# Patient Record
Sex: Male | Born: 1945 | Race: White | Hispanic: No | Marital: Married | Smoking: Never smoker
Health system: Southern US, Community
[De-identification: ages and names within clinical notes are randomized; demographics above are authoritative.]

## PROBLEM LIST (undated history)

## (undated) DIAGNOSIS — J45909 Unspecified asthma, uncomplicated: Secondary | ICD-10-CM

## (undated) DIAGNOSIS — Z9889 Other specified postprocedural states: Secondary | ICD-10-CM

## (undated) DIAGNOSIS — G4730 Sleep apnea, unspecified: Secondary | ICD-10-CM

## (undated) DIAGNOSIS — I340 Nonrheumatic mitral (valve) insufficiency: Secondary | ICD-10-CM

## (undated) DIAGNOSIS — I898 Other specified noninfective disorders of lymphatic vessels and lymph nodes: Secondary | ICD-10-CM

## (undated) DIAGNOSIS — S0990XA Unspecified injury of head, initial encounter: Secondary | ICD-10-CM

## (undated) DIAGNOSIS — R011 Cardiac murmur, unspecified: Secondary | ICD-10-CM

## (undated) DIAGNOSIS — K649 Unspecified hemorrhoids: Secondary | ICD-10-CM

## (undated) DIAGNOSIS — IMO0001 Reserved for inherently not codable concepts without codable children: Secondary | ICD-10-CM

## (undated) HISTORY — DX: Nonrheumatic mitral (valve) insufficiency: I34.0

## (undated) HISTORY — PX: COLONOSCOPY: SHX174

---

## 1968-09-25 HISTORY — PX: OTHER SURGICAL HISTORY: SHX169

## 1997-05-21 ENCOUNTER — Inpatient Hospital Stay (HOSPITAL_COMMUNITY): Admission: EM | Admit: 1997-05-21 | Discharge: 1997-05-25 | Payer: Self-pay | Admitting: Emergency Medicine

## 1999-07-03 ENCOUNTER — Ambulatory Visit (HOSPITAL_COMMUNITY): Admission: RE | Admit: 1999-07-03 | Discharge: 1999-07-03 | Payer: Self-pay | Admitting: Gastroenterology

## 1999-07-08 ENCOUNTER — Encounter (HOSPITAL_COMMUNITY): Admission: RE | Admit: 1999-07-08 | Discharge: 1999-10-06 | Payer: Self-pay | Admitting: Gastroenterology

## 2014-05-08 DIAGNOSIS — G479 Sleep disorder, unspecified: Secondary | ICD-10-CM

## 2014-05-08 DIAGNOSIS — R5383 Other fatigue: Secondary | ICD-10-CM | POA: Insufficient documentation

## 2014-05-08 DIAGNOSIS — E559 Vitamin D deficiency, unspecified: Secondary | ICD-10-CM

## 2014-05-08 DIAGNOSIS — R011 Cardiac murmur, unspecified: Secondary | ICD-10-CM | POA: Insufficient documentation

## 2014-05-09 ENCOUNTER — Ambulatory Visit: Payer: Self-pay | Admitting: Cardiovascular Disease

## 2014-05-10 ENCOUNTER — Ambulatory Visit (INDEPENDENT_AMBULATORY_CARE_PROVIDER_SITE_OTHER): Payer: Medicare Other | Admitting: Cardiology

## 2014-05-10 ENCOUNTER — Encounter: Payer: Self-pay | Admitting: Cardiology

## 2014-05-10 ENCOUNTER — Encounter: Payer: Self-pay | Admitting: *Deleted

## 2014-05-10 VITALS — BP 146/80 | HR 62 | Ht 67.0 in | Wt 186.4 lb

## 2014-05-10 DIAGNOSIS — R0989 Other specified symptoms and signs involving the circulatory and respiratory systems: Secondary | ICD-10-CM | POA: Diagnosis not present

## 2014-05-10 DIAGNOSIS — R011 Cardiac murmur, unspecified: Secondary | ICD-10-CM | POA: Diagnosis not present

## 2014-05-10 NOTE — Progress Notes (Addendum)
     Clinical Summary Jonathan Church is a 69 y.o.male seen today as a new patient for the  following medical problems.   1. Heart murmur - noted by pcp last week, patient denies any prior history - notes some DOE with high levels of activity, example running upstairs - no LE edema. No chest pain, no palpitations    Denies PMH   No Known Allergies   Current Outpatient Prescriptions  Medication Sig Dispense Refill  . OVER THE COUNTER MEDICATION Take 80,000 Int'l Units by mouth daily. Serrapeptase 80,000 IU daily     No current facility-administered medications for this visit.     No Known Allergies    No family history on file.   Social History Jonathan Church reports that he has never smoked. He has never used smokeless tobacco. Jonathan Church has no alcohol history on file.   Review of Systems CONSTITUTIONAL: No weight loss, fever, chills, weakness or fatigue.  HEENT: Eyes: No visual loss, blurred vision, double vision or yellow sclerae.No hearing loss, sneezing, congestion, runny nose or sore throat.  SKIN: No rash or itching.  CARDIOVASCULAR: per HPI RESPIRATORY: No shortness of breath, cough or sputum.  GASTROINTESTINAL: No anorexia, nausea, vomiting or diarrhea. No abdominal pain or blood.  GENITOURINARY: No burning on urination, no polyuria NEUROLOGICAL: No headache, dizziness, syncope, paralysis, ataxia, numbness or tingling in the extremities. No change in bowel or bladder control.  MUSCULOSKELETAL: No muscle, back pain, joint pain or stiffness.  LYMPHATICS: No enlarged nodes. No history of splenectomy.  PSYCHIATRIC: No history of depression or anxiety.  ENDOCRINOLOGIC: No reports of sweating, cold or heat intolerance. No polyuria or polydipsia.  Marland Kitchen   Physical Examination p 62 bp 146/80 Wt 186 lbs BMI 29 Gen: resting comfortably, no acute distress HEENT: no scleral icterus, pupils equal round and reactive, no palptable cervical adenopathy,  CV: RRR, 3/6 systolic  murmur apex, no JVD, + bilateral carotid bruits Resp: Clear to auscultation bilaterally GI: abdomen is soft, non-tender, non-distended, normal bowel sounds, no hepatosplenomegaly MSK: extremities are warm, no edema.  Skin: warm, no rash Neuro:  no focal deficits Psych: appropriate affect   Diagnostic Studies Clinic EKG: SR, RBBB and LAFB    Assessment and Plan  1. Heart murmur - significant systolic murmur on exam, will obtain echo to better evaluate  2. Carotid bruits - obtain carotid US   F/u pending echo results   Arnoldo Lenis, M.D.   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 LVEF is normal at 60- 65%, LVIDs 33, PASP reported as normal . He reports some SOB/DOE primarily with higher level activities. Referred for cath to continue workup. Given he is a very good potential candidate for repair in that he is otherwise healthy and active, we will go ahead and arrange referral to Dr Roxy Manns of CT surgery to discuss indications and possible timing for MV surgery.    Zandra Abts MD

## 2014-05-10 NOTE — Patient Instructions (Signed)
Your physician recommends that you schedule a follow-up appointment in: to be determined after testing   Your physician has requested that you have an echocardiogram. Echocardiography is a painless test that uses sound waves to create images of your heart. It provides your doctor with information about the size and shape of your heart and how well your heart's chambers and valves are working. This procedure takes approximately one hour. There are no restrictions for this procedure.  Your physician has requested that you have a carotid duplex. This test is an ultrasound of the carotid arteries in your neck. It looks at blood flow through these arteries that supply the brain with blood. Allow one hour for this exam. There are no restrictions or special instructions.  Your physician recommends that you continue on your current medications as directed. Please refer to the Current Medication list given to you today.  Thank you for choosing [facility]!!

## 2014-05-13 NOTE — Addendum Note (Signed)
Addended by: Julian Hy T on: 05/13/2014 01:13 PM   Modules accepted: Orders

## 2014-05-16 ENCOUNTER — Ambulatory Visit (HOSPITAL_COMMUNITY)
Admission: RE | Admit: 2014-05-16 | Discharge: 2014-05-16 | Disposition: A | Discharge status: Home / Self Care | Payer: Medicare Other | Source: Ambulatory Visit | Attending: Cardiology | Admitting: Cardiology

## 2014-05-16 ENCOUNTER — Ambulatory Visit (HOSPITAL_COMMUNITY): Admission: RE | Admit: 2014-05-16 | Payer: Medicare Other | Source: Ambulatory Visit

## 2014-05-16 DIAGNOSIS — R011 Cardiac murmur, unspecified: Secondary | ICD-10-CM | POA: Insufficient documentation

## 2014-05-16 DIAGNOSIS — I340 Nonrheumatic mitral (valve) insufficiency: Secondary | ICD-10-CM | POA: Insufficient documentation

## 2014-05-16 HISTORY — DX: Nonrheumatic mitral (valve) insufficiency: I34.0

## 2014-05-16 NOTE — Progress Notes (Signed)
  Echocardiogram 2D Echocardiogram has been performed.  Jonathan Church 05/16/2014, 4:16 PM

## 2014-05-22 ENCOUNTER — Telehealth: Payer: Self-pay | Admitting: Cardiology

## 2014-05-22 NOTE — Telephone Encounter (Signed)
Please call Jonathan Church with test results of a recent Echo.

## 2014-05-22 NOTE — Telephone Encounter (Signed)
Will forward to be scheduled

## 2014-05-22 NOTE — Telephone Encounter (Signed)
Echo result has not been documented. Will forward to Dr. Harl Bowie

## 2014-05-22 NOTE — Telephone Encounter (Signed)
I spoke with patient and updated him on findings. Looks to have severe mitral regurgitation. He needs a TEE with me at [facility], please contact Coralyn Mark to schedule.   Zandra Abts MD

## 2014-05-28 ENCOUNTER — Encounter: Payer: Self-pay | Admitting: *Deleted

## 2014-05-29 ENCOUNTER — Telehealth: Payer: Self-pay | Admitting: *Deleted

## 2014-05-29 NOTE — Telephone Encounter (Signed)
TEE scheduled for 06/11/14 at 815AM. Pt made aware, verbally gave instructions with date/time and also mailed instruction letter with date and time.

## 2014-06-10 ENCOUNTER — Other Ambulatory Visit: Payer: Self-pay | Admitting: Cardiology

## 2014-06-10 DIAGNOSIS — I340 Nonrheumatic mitral (valve) insufficiency: Secondary | ICD-10-CM

## 2014-06-11 ENCOUNTER — Ambulatory Visit (HOSPITAL_COMMUNITY)
Admission: RE | Admit: 2014-06-11 | Discharge: 2014-06-11 | Disposition: A | Discharge status: Home / Self Care | Payer: Medicare Other | Source: Ambulatory Visit | Attending: Cardiology | Admitting: Cardiology

## 2014-06-11 ENCOUNTER — Encounter: Payer: Self-pay | Admitting: *Deleted

## 2014-06-11 ENCOUNTER — Telehealth: Payer: Self-pay | Admitting: *Deleted

## 2014-06-11 ENCOUNTER — Encounter (HOSPITAL_COMMUNITY)
Admission: RE | Disposition: A | Discharge status: Home / Self Care | Payer: Self-pay | Source: Ambulatory Visit | Attending: Cardiology

## 2014-06-11 ENCOUNTER — Other Ambulatory Visit (HOSPITAL_COMMUNITY): Payer: Medicare Other

## 2014-06-11 ENCOUNTER — Ambulatory Visit (HOSPITAL_BASED_OUTPATIENT_CLINIC_OR_DEPARTMENT_OTHER): Payer: Medicare Other

## 2014-06-11 ENCOUNTER — Encounter (HOSPITAL_COMMUNITY): Payer: Self-pay | Admitting: *Deleted

## 2014-06-11 DIAGNOSIS — I340 Nonrheumatic mitral (valve) insufficiency: Secondary | ICD-10-CM | POA: Diagnosis present

## 2014-06-11 HISTORY — DX: Sleep apnea, unspecified: G47.30

## 2014-06-11 HISTORY — PX: TEE WITHOUT CARDIOVERSION: SHX5443

## 2014-06-11 SURGERY — ECHOCARDIOGRAM, TRANSESOPHAGEAL
Anesthesia: Moderate Sedation

## 2014-06-11 MED ORDER — FENTANYL CITRATE (PF) 100 MCG/2ML IJ SOLN
INTRAMUSCULAR | Status: AC
  Filled 2014-06-11: qty 4

## 2014-06-11 MED ORDER — SODIUM CHLORIDE 0.9 % IV SOLN
INTRAVENOUS | Status: DC
  Administered 2014-06-11: 09:00:00 via INTRAVENOUS

## 2014-06-11 MED ORDER — MIDAZOLAM HCL 5 MG/5ML IJ SOLN
INTRAMUSCULAR | Status: AC
  Filled 2014-06-11: qty 10

## 2014-06-11 MED ORDER — BUTAMBEN-TETRACAINE-BENZOCAINE 2-2-14 % EX AERO
INHALATION_SPRAY | CUTANEOUS | Status: DC | PRN
  Administered 2014-06-11: 2 via TOPICAL

## 2014-06-11 MED ORDER — FENTANYL CITRATE (PF) 100 MCG/2ML IJ SOLN
INTRAMUSCULAR | Status: DC | PRN
  Administered 2014-06-11: 25 ug via INTRAVENOUS
  Administered 2014-06-11: 50 ug via INTRAVENOUS

## 2014-06-11 MED ORDER — LIDOCAINE VISCOUS 2 % MT SOLN
OROMUCOSAL | Status: AC
  Filled 2014-06-11: qty 15

## 2014-06-11 MED ORDER — MIDAZOLAM HCL 5 MG/5ML IJ SOLN
INTRAMUSCULAR | Status: DC | PRN
  Administered 2014-06-11: 0.5 mg via INTRAVENOUS
  Administered 2014-06-11: 1 mg via INTRAVENOUS

## 2014-06-11 MED ORDER — LIDOCAINE HCL 2 % EX GEL
CUTANEOUS | Status: DC | PRN
  Administered 2014-06-11: 1

## 2014-06-11 NOTE — Telephone Encounter (Signed)
-----   Message from Arnoldo Lenis, MD sent at 06/11/2014 11:07 AM EDT ----- Reviewed TEE in details, appears to be severe mitral regurgitation. He needs a left and right heart cath for further evaluation. Please contact patient and arrange.   Zandra Abts MD

## 2014-06-11 NOTE — H&P (Signed)
Full H&P as reported below from most recent clinic visit. In summary 69 yo male with evidence of severe MR by TTE, plan for TEE today.    Clinical Summary Mr. Stockman is a 69 y.o.male seen today as a new patient for the following medical problems.   1. Heart murmur - noted by pcp last week, patient denies any prior history - notes some DOE with high levels of activity, example running upstairs - no LE edema. No chest pain, no palpitations    Denies PMH   No Known Allergies   Current Outpatient Prescriptions  Medication Sig Dispense Refill  . OVER THE COUNTER MEDICATION Take 80,000 Int'l Units by mouth daily. Serrapeptase 80,000 IU daily     No current facility-administered medications for this visit.     No Known Allergies    No family history on file.   Social History Mr. Sheek reports that he has never smoked. He has never used smokeless tobacco. Mr. Reece has no alcohol history on file.   Review of Systems CONSTITUTIONAL: No weight loss, fever, chills, weakness or fatigue.  HEENT: Eyes: No visual loss, blurred vision, double vision or yellow sclerae.No hearing loss, sneezing, congestion, runny nose or sore throat.  SKIN: No rash or itching.  CARDIOVASCULAR: per HPI RESPIRATORY: No shortness of breath, cough or sputum.  GASTROINTESTINAL: No anorexia, nausea, vomiting or diarrhea. No abdominal pain or blood.  GENITOURINARY: No burning on urination, no polyuria NEUROLOGICAL: No headache, dizziness, syncope, paralysis, ataxia, numbness or tingling in the extremities. No change in bowel or bladder control.  MUSCULOSKELETAL: No muscle, back pain, joint pain or stiffness.  LYMPHATICS: No enlarged nodes. No history of splenectomy.  PSYCHIATRIC: No history of depression or anxiety.  ENDOCRINOLOGIC: No reports of sweating, cold or heat intolerance. No polyuria or polydipsia.  Marland Kitchen   Physical Examination p 62 bp 146/80 Wt 186 lbs BMI 29 Gen:  resting comfortably, no acute distress HEENT: no scleral icterus, pupils equal round and reactive, no palptable cervical adenopathy,  CV: RRR, 3/6 systolic murmur apex, no JVD, + bilateral carotid bruits Resp: Clear to auscultation bilaterally GI: abdomen is soft, non-tender, non-distended, normal bowel sounds, no hepatosplenomegaly MSK: extremities are warm, no edema.  Skin: warm, no rash Neuro: no focal deficits Psych: appropriate affect   Diagnostic Studies Clinic EKG: SR, RBBB and LAFB    Assessment and Plan  1. Heart murmur - significant systolic murmur on exam, will obtain echo to better evaluate  2. Carotid bruits - obtain carotid US   F/u pending echo results   Arnoldo Lenis, M.D.

## 2014-06-11 NOTE — Discharge Instructions (Signed)
° °  Transesophageal Echocardiogram Transesophageal echocardiography (TEE) is a picture test of your heart using sound waves. The pictures taken can give very detailed pictures of your heart. This can help your doctor see if there are problems with your heart. TEE can check:  If your heart has blood clots in it.  How well your heart valves are working.  If you have an infection on the inside of your heart.  Some of the major arteries of your heart.  If your heart valve is working after a Office manager.  Your heart before a procedure that uses a shock to your heart to get the rhythm back to normal. BEFORE THE PROCEDURE  Do not eat or drink for 6 hours before the procedure or as told by your doctor.  Make plans to have someone drive you home after the procedure. Do not drive yourself home.  An IV tube will be put in your arm. PROCEDURE  You will be given a medicine to help you relax (sedative). It will be given through the IV tube.  A numbing medicine will be sprayed or gargled in the back of your throat to help numb it.  The tip of the probe is placed into the back of your mouth. You will be asked to swallow. This helps to pass the probe into your esophagus.  Once the tip of the probe is in the right place, your doctor can take pictures of your heart.  You may feel pressure at the back of your throat. AFTER THE PROCEDURE  You will be taken to a recovery area so the sedative can wear off.  Your throat may be sore and scratchy. This will go away slowly over time.  You will go home when you are fully awake and able to swallow liquids.  You should have someone stay with you for the next 24 hours.  Do not drive or operate machinery for the next 24 hours. Document Released: 11/08/2008 Document Revised: 01/16/2013 Document Reviewed: 07/13/2012 [facility] Patient Information 2015 [place], [place]. This information is not intended to replace advice given to you by your health care provider.  Make sure you discuss any questions you have with your health care provider.

## 2014-06-11 NOTE — Telephone Encounter (Signed)
Pt made aware, L&R heart cath scheduled for 06/18/14 @ 9AM with Dr. Irish Lack. Message sent to Dr. Harl Bowie. Pt given verbal instruction as well as mailed letter.

## 2014-06-11 NOTE — Procedures (Signed)
Procedure: TEE Attending: Dr Carlyle Dolly MD Indication: Mitral regurgitation   The patient was brought to the endo suite after appropriate consent was obtained. The posterior oropharynx was anesthesized with 2% viscous lidocaine and cetacaine spray. Moderate sedation was achieved with a total of 1.5 mg of versed and 58mcg of fentanyl. A bite block was placed, the TEE probe was intubated into the esophagus without difficulty. The patient tolerated the procedure without complication. Please see TEE report for full results. Likely severe MR, however difficult to quantify based on very eccentric jet.   Zandra Abts MD

## 2014-06-12 ENCOUNTER — Encounter (HOSPITAL_COMMUNITY): Payer: Self-pay | Admitting: Cardiology

## 2014-06-17 ENCOUNTER — Other Ambulatory Visit: Payer: Self-pay | Admitting: Cardiology

## 2014-06-17 DIAGNOSIS — I340 Nonrheumatic mitral (valve) insufficiency: Secondary | ICD-10-CM

## 2014-06-18 ENCOUNTER — Encounter (HOSPITAL_COMMUNITY)
Admission: RE | Disposition: A | Discharge status: Home / Self Care | Payer: Medicare Other | Source: Ambulatory Visit | Attending: Interventional Cardiology

## 2014-06-18 ENCOUNTER — Ambulatory Visit (HOSPITAL_COMMUNITY)
Admission: RE | Admit: 2014-06-18 | Discharge: 2014-06-18 | Disposition: A | Discharge status: Home / Self Care | Payer: Medicare Other | Source: Ambulatory Visit | Attending: Interventional Cardiology | Admitting: Interventional Cardiology

## 2014-06-18 ENCOUNTER — Encounter (HOSPITAL_COMMUNITY): Payer: Self-pay | Admitting: Interventional Cardiology

## 2014-06-18 DIAGNOSIS — I272 Other secondary pulmonary hypertension: Secondary | ICD-10-CM | POA: Insufficient documentation

## 2014-06-18 DIAGNOSIS — I340 Nonrheumatic mitral (valve) insufficiency: Secondary | ICD-10-CM | POA: Insufficient documentation

## 2014-06-18 DIAGNOSIS — Z01810 Encounter for preprocedural cardiovascular examination: Secondary | ICD-10-CM | POA: Insufficient documentation

## 2014-06-18 DIAGNOSIS — R011 Cardiac murmur, unspecified: Secondary | ICD-10-CM | POA: Diagnosis present

## 2014-06-18 HISTORY — PX: CARDIAC CATHETERIZATION: SHX172

## 2014-06-18 LAB — CBC
HCT: 42.1 % (ref 39.0–52.0)
Hemoglobin: 14.1 g/dL (ref 13.0–17.0)
MCH: 31.8 pg (ref 26.0–34.0)
MCHC: 33.5 g/dL (ref 30.0–36.0)
MCV: 94.8 fL (ref 78.0–100.0)
Platelets: 171 10*3/uL (ref 150–400)
RBC: 4.44 MIL/uL (ref 4.22–5.81)
RDW: 12.1 % (ref 11.5–15.5)
WBC: 6.8 10*3/uL (ref 4.0–10.5)

## 2014-06-18 LAB — BASIC METABOLIC PANEL
Anion gap: 8 (ref 5–15)
BUN: 15 mg/dL (ref 6–20)
CO2: 29 mmol/L (ref 22–32)
Calcium: 8.9 mg/dL (ref 8.9–10.3)
Chloride: 104 mmol/L (ref 101–111)
Creatinine, Ser: 1.48 mg/dL — ABNORMAL HIGH (ref 0.61–1.24)
GFR calc non Af Amer: 47 mL/min — ABNORMAL LOW (ref >60)
GFR, EST AFRICAN AMERICAN: 54 mL/min — AB (ref >60)
Glucose, Bld: 90 mg/dL (ref 65–99)
Potassium: 4.2 mmol/L (ref 3.5–5.1)
SODIUM: 141 mmol/L (ref 135–145)

## 2014-06-18 LAB — POCT I-STAT 3, ART BLOOD GAS (G3+)
Acid-Base Excess: 3 mmol/L — ABNORMAL HIGH (ref 0.0–2.0)
BICARBONATE: 27.4 meq/L — AB (ref 20.0–24.0)
O2 SAT: 97 %
PCO2 ART: 41.7 mmHg (ref 35.0–45.0)
PO2 ART: 87 mmHg (ref 80.0–100.0)
TCO2: 29 mmol/L (ref 0–100)
pH, Arterial: 7.425 (ref 7.350–7.450)

## 2014-06-18 LAB — POCT I-STAT 3, VENOUS BLOOD GAS (G3P V)
ACID-BASE EXCESS: 2 mmol/L (ref 0.0–2.0)
Bicarbonate: 28.3 mEq/L — ABNORMAL HIGH (ref 20.0–24.0)
O2 SAT: 65 %
PCO2 VEN: 49.2 mmHg (ref 45.0–50.0)
TCO2: 30 mmol/L (ref 0–100)
pH, Ven: 7.368 — ABNORMAL HIGH (ref 7.250–7.300)
pO2, Ven: 35 mmHg (ref 30.0–45.0)

## 2014-06-18 LAB — PROTIME-INR
INR: 1.06 (ref 0.00–1.49)
Prothrombin Time: 14 seconds (ref 11.6–15.2)

## 2014-06-18 SURGERY — RIGHT/LEFT HEART CATH AND CORONARY ANGIOGRAPHY

## 2014-06-18 MED ORDER — SODIUM CHLORIDE 0.9 % IV SOLN
INTRAVENOUS | Status: DC
  Administered 2014-06-18: 09:00:00 via INTRAVENOUS

## 2014-06-18 MED ORDER — MIDAZOLAM HCL 2 MG/2ML IJ SOLN
INTRAMUSCULAR | Status: AC
  Filled 2014-06-18: qty 2

## 2014-06-18 MED ORDER — VERAPAMIL HCL 2.5 MG/ML IV SOLN
INTRAVENOUS | Status: AC
  Filled 2014-06-18: qty 2

## 2014-06-18 MED ORDER — ASPIRIN 81 MG PO CHEW
81.0000 mg | CHEWABLE_TABLET | ORAL | Status: AC
  Administered 2014-06-18: 81 mg via ORAL

## 2014-06-18 MED ORDER — SODIUM CHLORIDE 0.9 % IJ SOLN: 3.0000 mL | INTRAMUSCULAR | Status: DC | PRN

## 2014-06-18 MED ORDER — SODIUM CHLORIDE 0.9 % IV SOLN: INTRAVENOUS | Status: DC

## 2014-06-18 MED ORDER — NITROGLYCERIN 1 MG/10 ML FOR IR/CATH LAB
INTRA_ARTERIAL | Status: AC
  Filled 2014-06-18: qty 10

## 2014-06-18 MED ORDER — FENTANYL CITRATE (PF) 100 MCG/2ML IJ SOLN
INTRAMUSCULAR | Status: AC
  Filled 2014-06-18: qty 2

## 2014-06-18 MED ORDER — SODIUM CHLORIDE 0.9 % IJ SOLN: 3.0000 mL | Freq: Two times a day (BID) | INTRAMUSCULAR | Status: DC

## 2014-06-18 MED ORDER — ASPIRIN 81 MG PO CHEW
CHEWABLE_TABLET | ORAL | Status: AC
  Filled 2014-06-18: qty 1

## 2014-06-18 MED ORDER — FENTANYL CITRATE (PF) 100 MCG/2ML IJ SOLN
INTRAMUSCULAR | Status: DC | PRN
  Administered 2014-06-18: 25 ug via INTRAVENOUS

## 2014-06-18 MED ORDER — MIDAZOLAM HCL 2 MG/2ML IJ SOLN
INTRAMUSCULAR | Status: DC | PRN
  Administered 2014-06-18: 1 mg via INTRAVENOUS

## 2014-06-18 MED ORDER — SODIUM CHLORIDE 0.9 % IV SOLN: 250.0000 mL | INTRAVENOUS | Status: DC | PRN

## 2014-06-18 MED ORDER — VERAPAMIL HCL 2.5 MG/ML IV SOLN
INTRAVENOUS | Status: DC | PRN
  Administered 2014-06-18: 10:00:00 via INTRA_ARTERIAL

## 2014-06-18 MED ORDER — IOHEXOL 350 MG/ML SOLN
INTRAVENOUS | Status: DC | PRN
  Administered 2014-06-18: 80 mL via INTRA_ARTERIAL

## 2014-06-18 MED ORDER — LIDOCAINE HCL (PF) 1 % IJ SOLN
INTRAMUSCULAR | Status: AC
  Filled 2014-06-18: qty 30

## 2014-06-18 MED ORDER — HEPARIN (PORCINE) IN NACL 2-0.9 UNIT/ML-% IJ SOLN
INTRAMUSCULAR | Status: AC
  Filled 2014-06-18: qty 1500

## 2014-06-18 MED ORDER — HEPARIN SODIUM (PORCINE) 1000 UNIT/ML IJ SOLN
INTRAMUSCULAR | Status: DC | PRN
  Administered 2014-06-18: 4000 [IU] via INTRAVENOUS

## 2014-06-18 MED ORDER — LIDOCAINE HCL (PF) 1 % IJ SOLN
INTRAMUSCULAR | Status: DC | PRN
  Administered 2014-06-18: 10 mL via INTRADERMAL

## 2014-06-18 MED ORDER — HEPARIN SODIUM (PORCINE) 1000 UNIT/ML IJ SOLN
INTRAMUSCULAR | Status: AC
  Filled 2014-06-18: qty 1

## 2014-06-18 SURGICAL SUPPLY — 15 items
CATH BALLN WEDGE 5F 110CM (CATHETERS) ×3 IMPLANT
CATH INFINITI 5 FR JL3.5 (CATHETERS) ×3 IMPLANT
CATH INFINITI 5FR ANG PIGTAIL (CATHETERS) ×3 IMPLANT
CATH INFINITI JR4 5F (CATHETERS) ×3 IMPLANT
DEVICE RAD COMP TR BAND LRG (VASCULAR PRODUCTS) ×3 IMPLANT
GLIDESHEATH SLEND SS 6F .021 (SHEATH) ×3 IMPLANT
GUIDEWIRE .025 260CM (WIRE) ×3 IMPLANT
KIT HEART LEFT (KITS) ×3 IMPLANT
KIT HEART RIGHT NAMIC (KITS) ×3 IMPLANT
PACK CARDIAC CATHETERIZATION (CUSTOM PROCEDURE TRAY) ×3 IMPLANT
SHEATH FAST CATH BRACH 5F 5CM (SHEATH) ×3 IMPLANT
SYR MEDRAD MARK V 150ML (SYRINGE) ×3 IMPLANT
TRANSDUCER W/STOPCOCK (MISCELLANEOUS) ×6 IMPLANT
TUBING CIL FLEX 10 FLL-RA (TUBING) ×3 IMPLANT
WIRE SAFE-T 1.5MM-J .035X260CM (WIRE) ×3 IMPLANT

## 2014-06-18 NOTE — Interval H&P Note (Signed)
Cath Lab Visit (complete for each Cath Lab visit)  Clinical Evaluation Leading to the Procedure:   ACS: No.  Non-ACS:    Anginal Classification: CCS III  Anti-ischemic medical therapy: No Therapy  Non-Invasive Test Results: No non-invasive testing performed  Prior CABG: No previous CABG      History and Physical Interval Note:  06/18/2014 9:47 AM  Jonathan Church  has presented today for surgery, with the diagnosis of cp  The various methods of treatment have been discussed with the patient and family. After consideration of risks, benefits and other options for treatment, the patient has consented to  Procedure(s): Right/Left Heart Cath and Coronary Angiography (N/A) as a surgical intervention .  The patient's history has been reviewed, patient examined, no change in status, stable for surgery.  I have reviewed the patient's chart and labs.  Questions were answered to the patient's satisfaction.     Erskine Steinfeldt S.

## 2014-06-18 NOTE — Discharge Instructions (Signed)
Radial Site Care °Refer to this sheet in the next few weeks. These instructions provide you with information on caring for yourself after your procedure. Your caregiver may also give you more specific instructions. Your treatment has been planned according to current medical practices, but problems sometimes occur. Call your caregiver if you have any problems or questions after your procedure. °HOME CARE INSTRUCTIONS °· You may shower the day after the procedure. Remove the bandage (dressing) and gently wash the site with plain soap and water. Gently pat the site dry. °· Do not apply powder or lotion to the site. °· Do not submerge the affected site in water for 3 to 5 days. °· Inspect the site at least twice daily. °· Do not flex or bend the affected arm for 24 hours. °· No lifting over 5 pounds (2.3 kg) for 5 days after your procedure. °· Do not drive home if you are discharged the same day of the procedure. Have someone else drive you. °· You may drive 24 hours after the procedure unless otherwise instructed by your caregiver. °· Do not operate machinery or power tools for 24 hours. °· A responsible adult should be with you for the first 24 hours after you arrive home. °What to expect: °· Any bruising will usually fade within 1 to 2 weeks. °· Blood that collects in the tissue (hematoma) may be painful to the touch. It should usually decrease in size and tenderness within 1 to 2 weeks. °SEEK IMMEDIATE MEDICAL CARE IF: °· You have unusual pain at the radial site. °· You have redness, warmth, swelling, or pain at the radial site. °· You have drainage (other than a small amount of blood on the dressing). °· You have chills. °· You have a fever or persistent symptoms for more than 72 hours. °· You have a fever and your symptoms suddenly get worse. °· Your arm becomes pale, cool, tingly, or numb. °· You have heavy bleeding from the site. Hold pressure on the site. °Document Released: 02/13/2010 Document Revised:  04/05/2011 Document Reviewed: 02/13/2010 °[employer]® Patient Information ©2015 [employer]. This information is not intended to replace advice given to you by your health care provider. Make sure you discuss any questions you have with your health care provider. ° °

## 2014-06-18 NOTE — H&P (View-Only) (Signed)
Full H&P as reported below from most recent clinic visit. In summary 69 yo male with evidence of severe MR by TTE, plan for TEE today.    Clinical Summary Mr. Givler is a 69 y.o.male seen today as a new patient for the following medical problems.   1. Heart murmur - noted by pcp last week, patient denies any prior history - notes some DOE with high levels of activity, example running upstairs - no LE edema. No chest pain, no palpitations    Denies PMH   No Known Allergies   Current Outpatient Prescriptions  Medication Sig Dispense Refill  . OVER THE COUNTER MEDICATION Take 80,000 Int'l Units by mouth daily. Serrapeptase 80,000 IU daily     No current facility-administered medications for this visit.     No Known Allergies    No family history on file.   Social History Mr. Leverich reports that he has never smoked. He has never used smokeless tobacco. Mr. Lankford has no alcohol history on file.   Review of Systems CONSTITUTIONAL: No weight loss, fever, chills, weakness or fatigue.  HEENT: Eyes: No visual loss, blurred vision, double vision or yellow sclerae.No hearing loss, sneezing, congestion, runny nose or sore throat.  SKIN: No rash or itching.  CARDIOVASCULAR: per HPI RESPIRATORY: No shortness of breath, cough or sputum.  GASTROINTESTINAL: No anorexia, nausea, vomiting or diarrhea. No abdominal pain or blood.  GENITOURINARY: No burning on urination, no polyuria NEUROLOGICAL: No headache, dizziness, syncope, paralysis, ataxia, numbness or tingling in the extremities. No change in bowel or bladder control.  MUSCULOSKELETAL: No muscle, back pain, joint pain or stiffness.  LYMPHATICS: No enlarged nodes. No history of splenectomy.  PSYCHIATRIC: No history of depression or anxiety.  ENDOCRINOLOGIC: No reports of sweating, cold or heat intolerance. No polyuria or polydipsia.  Marland Kitchen   Physical Examination p 62 bp 146/80 Wt 186 lbs BMI 29 Gen:  resting comfortably, no acute distress HEENT: no scleral icterus, pupils equal round and reactive, no palptable cervical adenopathy,  CV: RRR, 3/6 systolic murmur apex, no JVD, + bilateral carotid bruits Resp: Clear to auscultation bilaterally GI: abdomen is soft, non-tender, non-distended, normal bowel sounds, no hepatosplenomegaly MSK: extremities are warm, no edema.  Skin: warm, no rash Neuro: no focal deficits Psych: appropriate affect   Diagnostic Studies Clinic EKG: SR, RBBB and LAFB    Assessment and Plan  1. Heart murmur - significant systolic murmur on exam, will obtain echo to better evaluate  2. Carotid bruits - obtain carotid US   F/u pending echo results   Arnoldo Lenis, M.D.

## 2014-06-19 MED FILL — Heparin Sodium (Porcine) 2 Unit/ML in Sodium Chloride 0.9%: INTRAMUSCULAR | Qty: 1500 | Status: AC

## 2014-06-20 ENCOUNTER — Telehealth: Payer: Self-pay | Admitting: *Deleted

## 2014-06-20 DIAGNOSIS — I340 Nonrheumatic mitral (valve) insufficiency: Secondary | ICD-10-CM

## 2014-06-20 NOTE — Telephone Encounter (Signed)
-----   Message from Arnoldo Lenis, MD sent at 06/18/2014  1:18 PM EDT ----- Please refer patient to Dr Roxy Manns CT surgery for evaluation of mitral regurgitation   Zandra Abts MD

## 2014-06-20 NOTE — Telephone Encounter (Signed)
Orders placed for referral to Dr. Roxy Manns. Will forward to schedulers

## 2014-06-26 ENCOUNTER — Telehealth: Payer: Self-pay | Admitting: *Deleted

## 2014-06-26 NOTE — Telephone Encounter (Signed)
Spoke with [name] at [facility] and she is making appointment for Mr. [name] . She will contact patient.       Previous Messages     ----- Message -----   From: Massie Maroon, CMA   Sent: 06/20/2014 11:18 AM    To: Chanda Busing  Subject: RE: referral to Dr. Roxy Manns             Thank you.  ----- Message -----   From: Chanda Busing   Sent: 06/20/2014 10:59 AM    To: Massie Maroon, CMA  Subject: RE: referral to Dr. Lindaann Slough Got it. Patient is hospital now.  ----- Message -----   From: Massie Maroon, CMA   Sent: 06/20/2014 10:35 AM    To: Chanda Busing  Subject: referral to Dr. Peyton Najjar I routed you a phone note to refer this pt to Thoracic Surgery Dr. Roxy Manns for mitral regurgitation. Thank you much.   Shamarie Call

## 2014-06-28 ENCOUNTER — Other Ambulatory Visit: Payer: Self-pay | Admitting: *Deleted

## 2014-06-28 ENCOUNTER — Encounter: Payer: Self-pay | Admitting: Thoracic Surgery (Cardiothoracic Vascular Surgery)

## 2014-06-28 ENCOUNTER — Institutional Professional Consult (permissible substitution) (INDEPENDENT_AMBULATORY_CARE_PROVIDER_SITE_OTHER): Payer: Medicare Other | Admitting: Thoracic Surgery (Cardiothoracic Vascular Surgery)

## 2014-06-28 VITALS — BP 149/88 | HR 73 | Resp 16 | Ht 67.5 in | Wt 178.0 lb

## 2014-06-28 DIAGNOSIS — I340 Nonrheumatic mitral (valve) insufficiency: Secondary | ICD-10-CM

## 2014-06-28 DIAGNOSIS — I719 Aortic aneurysm of unspecified site, without rupture: Secondary | ICD-10-CM

## 2014-06-28 DIAGNOSIS — I7409 Other arterial embolism and thrombosis of abdominal aorta: Secondary | ICD-10-CM

## 2014-06-28 NOTE — Progress Notes (Addendum)
[address]       [place] [address]             [phone]     CARDIOTHORACIC SURGERY CONSULTATION REPORT  Referring Provider is Branch, Alphonse Guild, MD PCP is London Pepper, MD  Chief Complaint  Patient presents with  . Mitral Regurgitation    eval for surgery...ECHO 05/16/14, TEE 06/11/14, CATH 06/18/14    HPI:  Patient is a 69 year old previously healthy male who was recently discovered to have a heart murmur on routine physical exam by his primary care physician. He was referred to Dr. Harl Bowie for cardiology consultation, and transthoracic and transesophageal echocardiograms revealed the presence of mitral valve prolapse with a large flail segment of the posterior leaflet and severe mitral regurgitation. The patient was referred for elective surgical consultation.  The patient is married and lives locally between his [place] and [place]. He is a self-employed Development worker, community and has remained physically active and healthy all of his life. He does report a very slow gradual onset of mild exertional shortness of breath and fatigue over the last few years. He states that he only gets short of breath with very strenuous activity, but he has noted a slight decrease in his exercise tolerance. He denies any shortness of breath with ordinary activity. He has never had any chest discomfort or shortness of breath at rest. He has never had any symptoms of PND, orthopnea, or lower extremity edema.   Past Medical History  Diagnosis Date  . Sleep apnea     wears CPAP    Past Surgical History  Procedure Laterality Date  . Tee without cardioversion N/A 06/11/2014    Procedure: TRANSESOPHAGEAL ECHOCARDIOGRAM (TEE);  Surgeon: Arnoldo Lenis, MD;  Location: [facility];  Service: Cardiology;  Laterality: N/A;  . Cardiac catheterization N/A 06/18/2014    Procedure: Right/Left Heart Cath and Coronary Angiography;  Surgeon: Jettie Booze, MD;  Location: [facility];  Service: Cardiovascular;  Laterality: N/A;    History reviewed. No pertinent family history.  History   Social History  . Marital Status: Married    Spouse Name: N/A  . Number of Children: N/A  . Years of Education: N/A   Occupational History  . Not on file.   Social History Main Topics  . Smoking status: Never Smoker   . Smokeless tobacco: Never Used  . Alcohol Use: No  . Drug Use: Not on file  . Sexual Activity: Not on file   Other Topics Concern  . Not on file   Social History Narrative    Current Outpatient Prescriptions  Medication Sig Dispense Refill  . OVER THE COUNTER MEDICATION Take 80,000 Int'l Units by mouth daily. Serrapeptase 80,000 IU daily     No current facility-administered medications for this visit.    No Known Allergies    Review of Systems:   General:  normal appetite, decreased energy, no weight gain, no weight loss, no fever  Cardiac:  no chest pain with exertion, no chest pain at rest, + SOB with strenuous exertion, no resting SOB, no PND, no orthopnea, no palpitations, no arrhythmia, no atrial fibrillation, no LE edema, no dizzy spells, no syncope  Respiratory:  no shortness of breath, no home oxygen, no productive cough, no dry cough, no bronchitis, no wheezing, no hemoptysis, no asthma, no pain with inspiration or cough, + sleep apnea, + CPAP at night  GI:   no difficulty swallowing,  no reflux, no frequent heartburn, no hiatal hernia, no abdominal pain, no constipation, no diarrhea, no hematochezia, no hematemesis, no melena, + occasional hemorrhoids  GU:   no dysuria,  no frequency, no urinary tract infection, no hematuria, no enlarged prostate, no kidney stones, no kidney disease, mild erectile dysfunction  Vascular:  no pain suggestive of claudication, no pain in feet, no leg cramps, no varicose veins, no DVT, no non-healing foot ulcer  Neuro:   no stroke, no TIA's, no seizures, no headaches, no temporary blindness one eye,  no  slurred speech, no peripheral neuropathy, no chronic pain, no instability of gait, no memory/cognitive dysfunction  Musculoskeletal: no arthritis, n joint swelling, no myalgias, no difficulty walking, normal mobility   Skin:   no rash, no itching, no skin infections, no pressure sores or ulcerations  Psych:   no anxiety, no depression, no nervousness, no unusual recent stress  Eyes:   no blurry vision, no floaters, no recent vision changes, + wears glasses for reading  ENT:   no hearing loss, no loose or painful teeth, no dentures, last saw dentist within the past year  Hematologic:  no easy bruising, no abnormal bleeding, no clotting disorder, no frequent epistaxis  Endocrine:  no diabetes, does not check CBG's at home     Physical Exam:   BP 149/88 mmHg  Pulse 73  Resp 16  Ht 5' 7.5" (1.715 m)  Wt 178 lb (80.74 kg)  BMI 27.45 kg/m2  SpO2 98%  General:  Healthy,  well-appearing  HEENT:  Unremarkable   Neck:   no JVD, no bruits, no adenopathy   Chest:   clear to auscultation, symmetrical breath sounds, no wheezes, no rhonchi   CV:   RRR, grade IV/VI holosystolic murmur   Abdomen:  soft, non-tender, no masses   Extremities:  warm, well-perfused, pulses palpable, no LE edema  Rectal/GU  Deferred  Neuro:   Grossly non-focal and symmetrical throughout  Skin:   Clean and dry, no rashes, no breakdown   Diagnostic Tests:  Transthoracic Echocardiography  Patient:  Jonathan Church, Jonathan Church MR #:    [id number] Study Date: 05/16/2014 Gender:   M Age:    89 Height:   170.2 cm Weight:   81.2 kg BSA:    1.98 m^2 Pt. Status: Room:  ATTENDING  Kerry Hough, M.D. Berna Spare, M.D. REFERRING  Kerry Hough, M.D. PERFORMING  [facility] SONOGRAPHER Titus Mould, RCS  cc:  ------------------------------------------------------------------- LV EF: 60% -   65%  ------------------------------------------------------------------- Indications:   Murmur 785.2.  ------------------------------------------------------------------- Study Conclusions  - Left ventricle: The cavity size was normal. Systolic function was normal. The estimated ejection fraction was in the range of 60% to 65%. Wall motion was normal; there were no regional wall motion abnormalities. 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 Suggest TEE and consideration for repair if clinically indicated. - Left atrium: The atrium was mildly dilated. - Atrial septum: No defect or patent foramen ovale was identified.  Transthoracic echocardiography. M-mode, complete 2D, spectral Doppler, and color Doppler. Birthdate: Patient birthdate: Dec 29, 1945. Age: Patient is 69 yr old. Sex: Gender: male. BMI: 28 kg/m^2. Blood pressure:   148/80 Patient status: Inpatient. Study date: Study date: 05/16/2014. Study time: 03:23 PM. Location: Echo laboratory.  -------------------------------------------------------------------  ------------------------------------------------------------------- Left ventricle: The cavity size was normal. Systolic function was normal. The estimated ejection fraction was in the range of 60% to 65%. Wall motion was normal; there were no  regional wall motion abnormalities.  ------------------------------------------------------------------- Aortic valve:  Trileaflet; normal thickness leaflets. Mobility was not restricted. Doppler: Transvalvular velocity was within the normal range. There was no stenosis. There was no regurgitation.  ------------------------------------------------------------------- Aorta: Aortic root: The aortic root was normal in size.  ------------------------------------------------------------------- Mitral valve: Bileaflet prolapse with severe MR and  myxomatous valve leaflets. Suggest TEE and consideration for repair if clinically indicated. Mobility was not restricted. Doppler: Transvalvular velocity was within the normal range. There was no evidence for stenosis.  Peak gradient (D): 9 mm Hg.  ------------------------------------------------------------------- Left atrium: The atrium was mildly dilated.  ------------------------------------------------------------------- Atrial septum: No defect or patent foramen ovale was identified.  ------------------------------------------------------------------- Right ventricle: The cavity size was normal. Wall thickness was normal. Systolic function was normal.  ------------------------------------------------------------------- Pulmonic valve:  Doppler: Transvalvular velocity was within the normal range. There was no evidence for stenosis.  ------------------------------------------------------------------- Tricuspid valve:  Structurally normal valve.  Doppler: Transvalvular velocity was within the normal range. There was mild regurgitation.  ------------------------------------------------------------------- Pulmonary artery:  The main pulmonary artery was normal-sized. Systolic pressure was within the normal range.  ------------------------------------------------------------------- Right atrium: The atrium was normal in size.  ------------------------------------------------------------------- Pericardium: The pericardium was normal in appearance. There was no pericardial effusion.  ------------------------------------------------------------------- Systemic veins: Inferior vena cava: The vessel was normal in size.  ------------------------------------------------------------------- Measurements  Left ventricle             Value    Reference LV ID, ED, PLAX chordal    (H)   57.7 mm   43 - 52 LV ID, ES, PLAX chordal         32.9 mm   23 - 38 LV fx shortening, PLAX chordal     43  %   >=29 LV PW thickness, ED          9.51 mm   --------- IVS/LV PW ratio, ED          1.09     <=1.3 LV e&', lateral             13.7 cm/s  --------- LV E/e&', lateral            10.66    --------- LV e&', medial             8.59 cm/s  --------- LV E/e&', medial            17      --------- LV e&', average             11.15 cm/s  --------- LV E/e&', average            13.1     ---------  Ventricular septum           Value    Reference IVS thickness, ED           10.4 mm   ---------  LVOT                  Value    Reference LVOT ID, S               21  mm   --------- LVOT area               3.46 cm^2  ---------  Aorta                 Value    Reference Aortic root ID, ED           39  mm   ---------  Left atrium              Value    Reference LA ID, A-P, ES             42  mm   --------- LA ID/bsa, A-P             2.12 cm/m^2 <=2.2 LA volume, S              101  ml   --------- LA volume/bsa, S            51.1 ml/m^2 --------- LA volume, ES, 1-p A4C         91.4 ml   --------- LA volume/bsa, ES, 1-p A4C       46.2 ml/m^2 --------- LA volume, ES, 1-p A2C         112  ml   --------- LA volume/bsa, ES, 1-p A2C       56.6 ml/m^2 ---------  Mitral valve              Value    Reference Mitral E-wave peak velocity      146  cm/s  --------- Mitral A-wave peak velocity      62.8 cm/s  --------- Mitral deceleration time        230  ms   150 - 230 Mitral peak gradient, D        9   mm Hg  --------- Mitral E/A ratio, peak         2.4     --------- Mitral regurg VTI, PISA        123  cm   ---------  Systemic veins             Value    Reference Estimated CVP             3   mm Hg ---------  Right ventricle            Value    Reference RV ID, ED, PLAX            36.8 mm   19 - 38  Pulmonic valve             Value    Reference Pulmonic regurg velocity, ED      95  cm/s  ---------  Legend: (L) and (H) mark values outside specified reference range.  ------------------------------------------------------------------- Prepared and Electronically Authenticated by  Jenkins Rouge, M.D. 2016-04-21T17:16:33   Transesophageal Echocardiography  Patient:  Jonathan Church, Jonathan Church MR #:    [id number] Study Date: 06/11/2014 Gender:   M Age:    51 Height:   170.2 cm Weight:   84.5 kg BSA:    2.02 m^2 Pt. Status: Room:    [facility]  ADMITTING  Kerry Hough, M.D. ATTENDING  Kerry Hough, M.D. Berna Spare, M.D. REFERRING  Kerry Hough, M.D. PERFORMING  [facility] SONOGRAPHER Titus Mould, RCS  cc:  ------------------------------------------------------------------- LV EF: 60% -  65%  ------------------------------------------------------------------- Indications:   Mitral regurgitation 424.0.  ------------------------------------------------------------------- Study Conclusions  - Left ventricle: The cavity size was normal. Wall thickness was normal. Systolic function was normal. The estimated ejection fraction was in the range of 60% to 65%. - Aortic valve: No evidence of vegetation. - Mitral valve: The prolapsing portion of the posterior leaflet creates two seperate jets.This is best seen in image 23 with TEE probe at 110 degree angle. There is a very eccentric anterior  jet that runs along the  atrial wall and is difficult to quantify. There is a more centrally directed jet with a vena contracta of 0.4 cm. There was severe regurgitation. - Left atrium: The atrium was dilated. No evidence of thrombus in the appendage. - Right ventricle: The cavity size was mildly dilated. - Right atrium: No evidence of thrombus in the atrial cavity or appendage. - Atrial septum: No defect or patent foramen ovale was identified. Echo contrast study showed no right-to-left atrial level shunt, following an increase in RA pressure induced by provocative maneuvers. - Tricuspid valve: There was moderate regurgitation. The TR vena contracta is 0.4 cm.  Impressions:  - Severe eccentric MR. Eccentricity limits quantification, however regurgitation appears to be severe.  Diagnostic transesophageal echocardiography. 2D and color Doppler. Birthdate: Patient birthdate: 1945-10-01. Age: Patient is 69 yr old. Sex: Gender: male.  BMI: 29.2 kg/m^2. Blood pressure: 135/82 Study date: Study date: 06/11/2014. Study time: 09:00 AM.  -------------------------------------------------------------------  ------------------------------------------------------------------- Left ventricle: The cavity size was normal. Wall thickness was normal. Systolic function was normal. The estimated ejection fraction was in the range of 60% to 65%.  ------------------------------------------------------------------- Aortic valve:  Structurally normal valve.  Cusp separation was normal. No evidence of vegetation. Doppler:  There was no stenosis.  There was no regurgitation.  ------------------------------------------------------------------- Aorta: The visualized portions of the descending thoracic aorta were normal in size without significant plaque. Aortic root: The aortic root was normal in  size.  ------------------------------------------------------------------- Mitral valve:  Normal thickness leaflets . There is prolapse of a portion of the posterior leaflet, appears to be partial prolapse of the P2 segment of the posterior leaflet. The prolapsing portion of the posterior leaflet creates two seperate jets.This is best seen in image 23 with TEE probe at 110 degree angle. There is a very eccentric anterior jet that runs along the atrial wall and is difficult to quantify. There is a more centrally directed jet with a vena contracta of 0.4 cm. Eccentricity of jets prohbitis PISA measurements. Doppler:  There was no evidence for stenosis.  There was severe regurgitation.  ------------------------------------------------------------------- Left atrium: The atrium was dilated. No evidence of thrombus in the appendage. The appendage was of normal size. Emptying velocity was normal of 100 cm/s.  ------------------------------------------------------------------- Atrial septum: No defect or patent foramen ovale was identified. Echo contrast study showed no right-to-left atrial level shunt, following an increase in RA pressure induced by provocative maneuvers.  ------------------------------------------------------------------- Pulmonary veins: There is systolic blunting of pulmonary vein systolic flow consistent with elevated LA pressure.  ------------------------------------------------------------------- Right ventricle: The cavity size was mildly dilated. Systolic function was normal.  ------------------------------------------------------------------- Pulmonic valve:  Not well visualized. Doppler:  There was no evidence for stenosis.  There was mild regurgitation.  ------------------------------------------------------------------- Tricuspid valve:  Normal thickness leaflets. Doppler:  There was no evidence for stenosis.  There was moderate  regurgitation. The TR vena contracta is 0.4 cm.  ------------------------------------------------------------------- Right atrium: The atrium was normal in size. No evidence of thrombus in the atrial cavity or appendage.  ------------------------------------------------------------------- Pericardium: There was no pericardial effusion.  ------------------------------------------------------------------- Post procedure conclusions Ascending Aorta:  - The visualized portions of the descending thoracic aorta were normal in size without significant plaque.  ------------------------------------------------------------------- Measurements  Aorta       Value Aortic root ID   35  mm  Legend: (L) and (H) mark values outside specified reference range.  ------------------------------------------------------------------- Prepared and Electronically Authenticated by  Kerry Hough, M.D. 2016-05-17T10:48:04    CARDIAC CATHETERIZATION Conclusion     No significant  coronary artery disease.  Mild pulmonary artery hypertension.  Prominent V waves noted on wedge tracing. Mildly elevated LVEDP.  Pulmonary artery saturation 65%. Aortic saturation 96%. Cardiac output 4.2 L/m. Cardiac index 2.1.      Impression:  Patient has severe stage D primary mitral regurgitation. I have personally reviewed the patient's recent transthoracic and transesophageal echocardiograms. 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  Left ventricular size systolic function remain normal.  Diagnostic cardiac catheterization is notable for the absence of significant coronary artery disease. There was mild elevation of left ventricular end-diastolic pressure and prominent V waves on pulmonary capillary wedge tracing. There was mild pulmonary hypertension.  Echocardiogram demonstrates  mild to moderate tricuspid regurgitation.  I agree the patient needs elective mitral valve repair.  Based upon review of the patient's transesophageal echocardiogram I feel there is a very high likelihood that his valve should be repairable with very low associated operative risks. The patient appears to be a very good candidate for minimally invasive approach for surgery.   Plan:  The patient and his wife were counseled at length regarding the indications, risks and potential benefits of mitral valve repair.  The rationale for elective surgery has been explained, including a comparison between surgery and continued medical therapy with close follow-up.  The likelihood of successful and durable valve repair has been discussed with particular reference to the findings of their recent echocardiogram.  Based upon these findings and previous experience, I have quoted them a greater than 95 percent likelihood of successful valve repair.  Alternative surgical approaches have been discussed including a comparison between conventional sternotomy and minimally-invasive techniques.  The relative risks and benefits of each have been reviewed as they pertain to the patient's specific circumstances, and all of their questions have been addressed.   The timing of surgery has been discussed as were expectations for the patient's postoperative convalescence. The patient desires to wait until later this summer before proceeding with surgery for a variety of personal reasons. He will plan to return in 6 weeks at which time we will make final plans for surgery. We will obtain CT angiogram of the aorta prior to his next office visit to evaluate possible alternative sites for arterial cannulation for surgery. All of his questions have been addressed.  I spent in excess of 90 minutes during the conduct of this office consultation and >50% of this time involved direct face-to-face encounter with the patient for counseling and/or  coordination of their care.   Valentina Gu. Roxy Manns, MD 06/28/2014 9:53 PM

## 2014-07-02 ENCOUNTER — Inpatient Hospital Stay: Admission: RE | Admit: 2014-07-02 | Payer: Medicare Other | Source: Ambulatory Visit

## 2014-07-02 ENCOUNTER — Ambulatory Visit
Admission: RE | Admit: 2014-07-02 | Discharge: 2014-07-02 | Disposition: A | Discharge status: Home / Self Care | Payer: Medicare Other | Source: Ambulatory Visit | Attending: Thoracic Surgery (Cardiothoracic Vascular Surgery) | Admitting: Thoracic Surgery (Cardiothoracic Vascular Surgery)

## 2014-07-02 DIAGNOSIS — I719 Aortic aneurysm of unspecified site, without rupture: Secondary | ICD-10-CM

## 2014-07-02 DIAGNOSIS — I7409 Other arterial embolism and thrombosis of abdominal aorta: Secondary | ICD-10-CM

## 2014-07-02 MED ORDER — IOHEXOL 350 MG/ML SOLN
80.0000 mL | Freq: Once | INTRAVENOUS | Status: AC | PRN
  Administered 2014-07-02: 80 mL via INTRAVENOUS

## 2014-08-12 ENCOUNTER — Encounter: Payer: Self-pay | Admitting: Thoracic Surgery (Cardiothoracic Vascular Surgery)

## 2014-08-12 ENCOUNTER — Ambulatory Visit (INDEPENDENT_AMBULATORY_CARE_PROVIDER_SITE_OTHER): Payer: Medicare Other | Admitting: Thoracic Surgery (Cardiothoracic Vascular Surgery)

## 2014-08-12 VITALS — BP 137/75 | HR 71 | Resp 16 | Ht 67.5 in | Wt 177.0 lb

## 2014-08-12 DIAGNOSIS — I340 Nonrheumatic mitral (valve) insufficiency: Secondary | ICD-10-CM

## 2014-08-12 MED ORDER — AMIODARONE HCL 200 MG PO TABS: 200.0000 mg | ORAL_TABLET | Freq: Two times a day (BID) | ORAL | Status: DC

## 2014-08-12 NOTE — Progress Notes (Signed)
[address]       [place] [address]             [phone]     CARDIOTHORACIC SURGERY OFFICE NOTE  Referring Provider is Branch, Alphonse Guild, MD PCP is London Pepper, MD   HPI:  Patient returns to the office today for follow-up of stage D severe symptomatic primary mitral regurgitation with mitral valve prolapse and large flail segment of the posterior leaflet. He was originally seen in consultation on 06/28/2014. Since then he underwent CT angiography to evaluate the feasibility of peripheral arterial access for surgery. He returns to the office today to review the results of this test and potentially schedule elective mitral valve repair. He reports no new problems or complaints over the past month. He continues to experience stable symptoms of exertional shortness of breath consistent with chronic diastolic congestive heart failure, New York Heart Association functional class II.   Current Outpatient Prescriptions  Medication Sig Dispense Refill  . OVER THE COUNTER MEDICATION Take 80,000 Int'l Units by mouth daily. Serrapeptase 80,000 IU daily    . amiodarone (PACERONE) 200 MG tablet Take 1 tablet (200 mg total) by mouth 2 (two) times daily. 30 tablet 0   No current facility-administered medications for this visit.      Physical Exam:   BP 137/75 mmHg  Pulse 71  Resp 16  Ht 5' 7.5" (1.715 m)  Wt 177 lb (80.287 kg)  BMI 27.30 kg/m2  SpO2 97%  General:  Well-appearing  Chest:   clear  CV:   Regular rate and rhythm with prominent systolic murmur  Incisions:  n/a  Abdomen:  Soft and nontender  Extremities:  Warm and well-perfused  Diagnostic Tests:  CT ANGIOGRAPHY CHEST, ABDOMEN AND PELVIS  TECHNIQUE: Multidetector CT imaging through the chest, abdomen and pelvis was performed using the standard protocol during bolus administration of intravenous contrast. Multiplanar reconstructed images and MIPs were obtained and reviewed to evaluate the  vascular anatomy.  CONTRAST: 12mL OMNIPAQUE IOHEXOL 350 MG/ML SOLN  COMPARISON: None.  FINDINGS: CTA CHEST FINDINGS  VASCULAR  Heart/Vascular: Conventional 3 vessel aortic arch anatomy. Mildly tortuous thoracic aorta. No evidence of aneurysm, dissection or other acute aortic abnormality. The main and central pulmonary arteries are within normal limits for size. No central. Mild cardiomegaly. No pericardial effusion  Review of the MIP images confirms the above findings.  NON VASCULAR  Mediastinum: Unremarkable CT appearance of the thyroid gland. No suspicious mediastinal or hilar adenopathy. No soft tissue mediastinal mass. The thoracic esophagus is unremarkable.  Lungs/Pleura: Mild dependent atelectasis in the lower lobes. Otherwise, the lungs are clear.  Bones/Soft Tissues: No acute fracture or aggressive appearing lytic or blastic osseous lesion.  CTA ABDOMEN AND PELVIS FINDINGS  VASCULAR  Aorta: Normal caliber aorta without evidence of atherosclerotic plaque, dissection or other acute abnormality.  Celiac: Variants arterial anatomy. The splenic artery arises directly from the abdominal aorta. The common hepatic artery is replaced the SMA.  SMA: Replaced common hepatic artery.  Renals: Single dominant renal arteries bilaterally which are widely patent. No changes of fibromuscular dysplasia.  IMA: Widely patent.  Inflow: Widely patent and unremarkable.  Proximal Outflow: Widely patent and unremarkable.  Veins: No focal venous abnormality within the limitations of this non venous phase timed study.  Review of the MIP images confirms the above findings.  NON-VASCULAR  Abdomen: Unremarkable CT scan of stomach, duodenum, spleen, adrenal glands and pancreas. No discrete hepatic lesion. Calcified  gallstones within the gallbladder lumen. No secondary findings to suggest acute cholecystitis no intra or extrahepatic biliary  ductal dilatation.  Unremarkable appearance of the bilateral kidneys. No focal solid lesion, hydronephrosis or nephrolithiasis. 2.9 cm simple cyst exophytic from the anterior interpolar right kidney. Gallbladder is unremarkable. No evidence of obstruction or focal bowel wall thickening. Normal appendix in the right lower quadrant. The terminal ileum is unremarkable. No free fluid or suspicious adenopathy.  Pelvis: Prostatomegaly with some indentation of the bladder. Otherwise, the bladder is unremarkable no free fluid or significant adenopathy.  Bones/Soft Tissues: No acute fracture or aggressive appearing lytic or blastic osseous lesion. T9 and T4 vertebral body hemangiomas. Multilevel degenerative disc disease most notable at L4-L5 and L5-S1.  IMPRESSION: VASCULAR  1. No evidence of acute vascular abnormality, aneurysm or significant atherosclerotic vascular plaque or stenosis. 2. 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 NON VASCULAR  1. No acute abnormality in the chest, abdomen or pelvis. 2. Mild cardiomegaly. 3. Cholelithiasis. 4. Simple right renal cyst. 5. Prostatomegaly with indentation of the bladder base. 6. Lower lumbar degenerative disc disease.  Signed,  Criselda Peaches, MD  [facility]  [facility]   Electronically Signed  By: Jacqulynn Cadet M.D.  On: 07/02/2014 17:37    Impression:  Patient has stage D severe symptomatic primary mitral regurgitation. I have personally reviewed the patient's recent transthoracic and transesophageal echocardiograms. 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 Left ventricular size systolic function remain normal. Diagnostic cardiac catheterization is notable for the absence of significant coronary artery disease. There was  mild elevation of left ventricular end-diastolic pressure and prominent V waves on pulmonary capillary wedge tracing. There was mild pulmonary hypertension. Echocardiogram demonstrates mild to moderate tricuspid regurgitation. I agree the patient needs elective mitral valve repair. Based upon review of the patient's transesophageal echocardiogram I feel there is a very high likelihood that his valve should be repairable with very low associated operative risks. The patient appears to be a very good candidate for minimally invasive approach for surgery.   Plan:  The patient and his wife were again counseled at length regarding the indications, risks and potential benefits of mitral valve repair. The rationale for elective surgery has been explained, including a comparison between surgery and continued medical therapy with close follow-up. The likelihood of successful and durable valve repair has been discussed with particular reference to the findings of their recent echocardiogram. Based upon these findings and previous experience, I have quoted them a greater than 95 percent likelihood of successful valve repair.  The patient desires to proceed with elective surgery towards the end of August. We tentatively plan to proceed on Wednesday 09/18/2014.  The patient understands and accepts all potential risks of surgery including but not limited to risk of death, stroke or other neurologic complication, myocardial infarction, congestive heart failure, respiratory failure, renal failure, bleeding requiring transfusion and/or reexploration, arrhythmia, infection or other wound complications, pneumonia, pleural and/or pericardial effusion, pulmonary embolus, aortic dissection or other major vascular complication, or delayed complications related to valve repair or replacement including but not limited to structural valve deterioration and failure, thrombosis, embolization, endocarditis, or paravalvular leak.   Alternative surgical approaches have been discussed including a comparison between conventional sternotomy and minimally-invasive techniques.  The relative risks and benefits of each have been reviewed as they pertain to the patient's specific circumstances,  and all of their questions have been addressed.  Specific risks potentially related to the minimally-invasive approach were discussed at length, including but not limited to risk of conversion to full or partial sternotomy, aortic dissection or other major vascular complication, unilateral acute lung injury or pulmonary edema, phrenic nerve dysfunction or paralysis, rib fracture, chronic pain, lung hernia, or lymphocele. All of their questions have been answered.  The patient has been given a prescription for amiodarone to begin 7 days prior to surgery to decrease his risk of perioperative atrial dysrhythmias.  He will return to our office on Monday, 09/16/2014 for follow-up prior to surgery.    I spent in excess of 30 minutes during the conduct of this office consultation and >50% of this time involved direct face-to-face encounter with the patient for counseling and/or coordination of their care.   Valentina Gu. Roxy Manns, MD 08/12/2014 5:50 PM

## 2014-08-12 NOTE — Patient Instructions (Signed)
Patient has been instructed to stop taking your enzyme replacement 2 weeks before your surgery.  Begin taking amiodarone 7 days before your surgery  Patient should have nothing to eat or drink after midnight the night before surgery.  On the morning of surgery patient does not need to take amiodarone

## 2014-08-13 ENCOUNTER — Other Ambulatory Visit: Payer: Self-pay

## 2014-08-13 DIAGNOSIS — I340 Nonrheumatic mitral (valve) insufficiency: Secondary | ICD-10-CM

## 2014-09-16 ENCOUNTER — Encounter: Payer: Self-pay | Admitting: Thoracic Surgery (Cardiothoracic Vascular Surgery)

## 2014-09-16 ENCOUNTER — Encounter (HOSPITAL_COMMUNITY): Payer: Self-pay

## 2014-09-16 ENCOUNTER — Other Ambulatory Visit: Payer: Self-pay

## 2014-09-16 ENCOUNTER — Encounter (HOSPITAL_COMMUNITY)
Admission: RE | Admit: 2014-09-16 | Discharge: 2014-09-16 | Disposition: A | Discharge status: Home / Self Care | Payer: Medicare Other | Source: Ambulatory Visit | Attending: Thoracic Surgery (Cardiothoracic Vascular Surgery) | Admitting: Thoracic Surgery (Cardiothoracic Vascular Surgery)

## 2014-09-16 ENCOUNTER — Ambulatory Visit (INDEPENDENT_AMBULATORY_CARE_PROVIDER_SITE_OTHER): Payer: Medicare Other | Admitting: Thoracic Surgery (Cardiothoracic Vascular Surgery)

## 2014-09-16 ENCOUNTER — Ambulatory Visit (HOSPITAL_BASED_OUTPATIENT_CLINIC_OR_DEPARTMENT_OTHER)
Admission: RE | Admit: 2014-09-16 | Discharge: 2014-09-16 | Disposition: A | Discharge status: Home / Self Care | Payer: Medicare Other | Source: Ambulatory Visit | Attending: Thoracic Surgery (Cardiothoracic Vascular Surgery) | Admitting: Thoracic Surgery (Cardiothoracic Vascular Surgery)

## 2014-09-16 ENCOUNTER — Ambulatory Visit (HOSPITAL_COMMUNITY)
Admission: RE | Admit: 2014-09-16 | Discharge: 2014-09-16 | Disposition: A | Discharge status: Home / Self Care | Payer: Medicare Other | Source: Ambulatory Visit | Attending: Thoracic Surgery (Cardiothoracic Vascular Surgery) | Admitting: Thoracic Surgery (Cardiothoracic Vascular Surgery)

## 2014-09-16 VITALS — BP 148/59 | HR 58 | Temp 97.8°F | Resp 20 | Ht 67.5 in | Wt 180.3 lb

## 2014-09-16 VITALS — BP 156/87 | HR 64 | Resp 20 | Ht 67.5 in | Wt 180.0 lb

## 2014-09-16 DIAGNOSIS — Z01812 Encounter for preprocedural laboratory examination: Secondary | ICD-10-CM

## 2014-09-16 DIAGNOSIS — I340 Nonrheumatic mitral (valve) insufficiency: Secondary | ICD-10-CM

## 2014-09-16 DIAGNOSIS — Z01818 Encounter for other preprocedural examination: Secondary | ICD-10-CM

## 2014-09-16 DIAGNOSIS — Z0183 Encounter for blood typing: Secondary | ICD-10-CM | POA: Insufficient documentation

## 2014-09-16 DIAGNOSIS — I6523 Occlusion and stenosis of bilateral carotid arteries: Secondary | ICD-10-CM

## 2014-09-16 HISTORY — DX: Unspecified injury of head, initial encounter: S09.90XA

## 2014-09-16 HISTORY — DX: Cardiac murmur, unspecified: R01.1

## 2014-09-16 HISTORY — DX: Reserved for inherently not codable concepts without codable children: IMO0001

## 2014-09-16 HISTORY — DX: Unspecified asthma, uncomplicated: J45.909

## 2014-09-16 HISTORY — DX: Unspecified hemorrhoids: K64.9

## 2014-09-16 LAB — PULMONARY FUNCTION TEST
DL/VA % pred: 103 %
DL/VA: 4.58 ml/min/mmHg/L
DLCO unc % pred: 102 %
DLCO unc: 29.83 ml/min/mmHg
FEF 25-75 Post: 1.94 L/sec
FEF 25-75 Pre: 1.11 L/sec
FEF2575-%Change-Post: 75 %
FEF2575-%Pred-Post: 83 %
FEF2575-%Pred-Pre: 47 %
FEV1-%Change-Post: 16 %
FEV1-%Pred-Post: 98 %
FEV1-%Pred-Pre: 85 %
FEV1-Post: 2.97 L
FEV1-Pre: 2.56 L
FEV1FVC-%Change-Post: 13 %
FEV1FVC-%Pred-Pre: 81 %
FEV6-%Change-Post: 6 %
FEV6-%Pred-Post: 107 %
FEV6-%Pred-Pre: 100 %
FEV6-Post: 4.12 L
FEV6-Pre: 3.87 L
FEV6FVC-%Change-Post: 4 %
FEV6FVC-%Pred-Post: 101 %
FEV6FVC-%Pred-Pre: 97 %
FVC-%Change-Post: 2 %
FVC-%Pred-Post: 106 %
FVC-%Pred-Pre: 103 %
FVC-Post: 4.32 L
FVC-Pre: 4.22 L
Post FEV1/FVC ratio: 69 %
Post FEV6/FVC ratio: 95 %
Pre FEV1/FVC ratio: 61 %
Pre FEV6/FVC Ratio: 92 %
RV % pred: 134 %
RV: 3.06 L
TLC % pred: 114 %
TLC: 7.47 L

## 2014-09-16 LAB — BLOOD GAS, ARTERIAL
Acid-Base Excess: 1.9 mmol/L (ref 0.0–2.0)
BICARBONATE: 26.2 meq/L — AB (ref 20.0–24.0)
Drawn by: 42180
FIO2: 0.21
O2 Saturation: 96 %
PATIENT TEMPERATURE: 98.6
PH ART: 7.403 (ref 7.350–7.450)
PO2 ART: 79.6 mmHg — AB (ref 80.0–100.0)
TCO2: 27.5 mmol/L (ref 0–100)
pCO2 arterial: 42.9 mmHg (ref 35.0–45.0)

## 2014-09-16 LAB — TYPE AND SCREEN
ABO/RH(D): O POS
Antibody Screen: NEGATIVE

## 2014-09-16 LAB — CBC
HEMATOCRIT: 42.1 % (ref 39.0–52.0)
HEMOGLOBIN: 14.1 g/dL (ref 13.0–17.0)
MCH: 31.8 pg (ref 26.0–34.0)
MCHC: 33.5 g/dL (ref 30.0–36.0)
MCV: 95 fL (ref 78.0–100.0)
Platelets: 188 10*3/uL (ref 150–400)
RBC: 4.43 MIL/uL (ref 4.22–5.81)
RDW: 12.2 % (ref 11.5–15.5)
WBC: 7 10*3/uL (ref 4.0–10.5)

## 2014-09-16 LAB — URINALYSIS, ROUTINE W REFLEX MICROSCOPIC
Bilirubin Urine: NEGATIVE
GLUCOSE, UA: NEGATIVE mg/dL
Hgb urine dipstick: NEGATIVE
KETONES UR: NEGATIVE mg/dL
LEUKOCYTES UA: NEGATIVE
NITRITE: NEGATIVE
PH: 5.5 (ref 5.0–8.0)
Protein, ur: NEGATIVE mg/dL
SPECIFIC GRAVITY, URINE: 1.018 (ref 1.005–1.030)
Urobilinogen, UA: 1 mg/dL (ref 0.0–1.0)

## 2014-09-16 LAB — PROTIME-INR
INR: 1.07 (ref 0.00–1.49)
PROTHROMBIN TIME: 14.1 s (ref 11.6–15.2)

## 2014-09-16 LAB — COMPREHENSIVE METABOLIC PANEL
ALBUMIN: 3.8 g/dL (ref 3.5–5.0)
ALK PHOS: 53 U/L (ref 38–126)
ALT: 14 U/L — ABNORMAL LOW (ref 17–63)
ANION GAP: 8 (ref 5–15)
AST: 20 U/L (ref 15–41)
BILIRUBIN TOTAL: 1.4 mg/dL — AB (ref 0.3–1.2)
BUN: 14 mg/dL (ref 6–20)
CALCIUM: 9.1 mg/dL (ref 8.9–10.3)
CO2: 25 mmol/L (ref 22–32)
Chloride: 107 mmol/L (ref 101–111)
Creatinine, Ser: 1.57 mg/dL — ABNORMAL HIGH (ref 0.61–1.24)
GFR, EST AFRICAN AMERICAN: 51 mL/min — AB (ref >60)
GFR, EST NON AFRICAN AMERICAN: 44 mL/min — AB (ref >60)
GLUCOSE: 92 mg/dL (ref 65–99)
POTASSIUM: 3.9 mmol/L (ref 3.5–5.1)
Sodium: 140 mmol/L (ref 135–145)
TOTAL PROTEIN: 7.4 g/dL (ref 6.5–8.1)

## 2014-09-16 LAB — SURGICAL PCR SCREEN
MRSA, PCR: NEGATIVE
Staphylococcus aureus: POSITIVE — AB

## 2014-09-16 LAB — APTT: aPTT: 35 seconds (ref 24–37)

## 2014-09-16 LAB — ABO/RH: ABO/RH(D): O POS

## 2014-09-16 MED ORDER — ALBUTEROL SULFATE (2.5 MG/3ML) 0.083% IN NEBU
2.5000 mg | INHALATION_SOLUTION | Freq: Once | RESPIRATORY_TRACT | Status: AC
  Administered 2014-09-16: 2.5 mg via RESPIRATORY_TRACT

## 2014-09-16 NOTE — Pre-Procedure Instructions (Signed)
    Jonathan Church  09/16/2014    Your procedure is scheduled on : Wednesday, August 24.  Report to [facility] Admitting at 6:30 A.M.               Your surgery is scheduled for 8:30A.M.  Call this number if you have problems the morning of [phone]               For any other questions, please call [phone], Monday - Friday 8 AM - 4 PM.   Remember:  Do not eat food or drink liquids after midnight Tuesday, August 23.  Take these medicines the morning of surgery with A SIP OF WATER None.                    Do not take any NSAIDs : Aspirin, Aspirin Products, Ibuprofen (Advil), , Naproxen (Aleve)   Do not wear jewelry, make-up or nail polish.  Do not wear lotions, powders, or perfumes.                Men may shave face and neck.   Do not bring valuables to the hospital.   [facility] is not responsible for any belongings or valuables.  Contacts, dentures or bridgework may not be worn into surgery.  Leave your suitcase in the car.  After surgery it may be brought to your room.  For patients admitted to the hospital, discharge time will be determined by your treatment team.   Special instructions:  Review  [facility] - Preparing For Surgery.  Please read over the following fact sheets that you were given:  Coughing and Deep Breathing, Pain Management, Blood Transfusions and Incentive Spirometeryn Management,

## 2014-09-16 NOTE — Progress Notes (Signed)
Pre-op Cardiac Surgery  Carotid Findings:  1-39% ICA stenosis.  Vertebral artery flow is antegrade.   Upper Extremity Right Left  Brachial Pressures 133T 141T  Radial Waveforms T T  Ulnar Waveforms T T  Palmar Arch (Allen's Test) WNL WNL   Findings:  WNL    Lower  Extremity Right Left  Dorsalis Pedis    Anterior Tibial    Posterior Tibial    Ankle/Brachial Indices      Findings:  palpable

## 2014-09-16 NOTE — Progress Notes (Signed)
I called a prescription for Mupirocin ointment to [facility], [address], [place], [place]

## 2014-09-16 NOTE — Patient Instructions (Signed)
  Patient should continue taking all current medications without change through the day before surgery.  Patient should have nothing to eat or drink after midnight the night before surgery.  On the morning of surgery patient does not need to take any medications.

## 2014-09-16 NOTE — Progress Notes (Signed)
[address]       [place] [address]             [phone]     CARDIOTHORACIC SURGERY OFFICE NOTE  Referring Provider is Branch, Alphonse Guild, MD PCP is London Pepper, MD   HPI:  Patient returns to the office today for follow-up of stage D severe symptomatic primary mitral regurgitation with mitral valve prolapse and large flail segment of the posterior leaflet. He was originally seen in consultation on 06/28/2014 and he was seen more recently on 08/12/2014.  He reports no new problems or complaints over the past month.   Current Outpatient Prescriptions  Medication Sig Dispense Refill  . amiodarone (PACERONE) 200 MG tablet Take 1 tablet (200 mg total) by mouth 2 (two) times daily. (Patient taking differently: Take 200 mg by mouth See admin instructions. Take 1 tablet (200 mg) by mouth twice daily for one week before the surgery starting on 09/11/14; do not take on the day of the surgery; then take1 tablet (200 mg)  twice daily for one week after surgery) 30 tablet 0  . OVER THE COUNTER MEDICATION Take 80,000 Int'l Units by mouth daily. Serapeptase 80,000 IU daily     No current facility-administered medications for this visit.      Physical Exam:   BP 156/87 mmHg  Pulse 64  Resp 20  Ht 5' 7.5" (1.715 m)  Wt 180 lb (81.647 kg)  BMI 27.76 kg/m2  SpO2 96%  General:  Well-appearing  Chest:   clear  CV:   Regular rate and rhythm with prominent holosystolic murmur  Incisions:  n/a  Abdomen:  Soft and nontender  Extremities:  Warm and well-perfused  Diagnostic Tests:  n/a   Impression:  Patient has stage D severe symptomatic primary mitral regurgitation. I have personally reviewed the patient's recent transthoracic and transesophageal echocardiograms. 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 Left ventricular size systolic function remain  normal. Diagnostic cardiac catheterization is notable for the absence of significant coronary artery disease. There was mild elevation of left ventricular end-diastolic pressure and prominent V waves on pulmonary capillary wedge tracing. There was mild pulmonary hypertension. Echocardiogram demonstrates mild to moderate tricuspid regurgitation. I agree the patient needs elective mitral valve repair. Based upon review of the patient's transesophageal echocardiogram I feel there is a very high likelihood that his valve should be repairable with very low associated operative risks. The patient appears to be a very good candidate for minimally invasive approach for surgery.     Plan:  The patient and his wife were again counseled regarding the indications, risks and potential benefits of mitral valve repair.  The rationale for elective surgery has been explained, including a comparison between surgery and continued medical therapy with close follow-up.  The likelihood of successful and durable valve repair has been discussed with particular reference to the findings of their recent echocardiogram.  Based upon these findings and previous experience, I have quoted them a greater than 95 percent likelihood of successful valve repair.  In the unlikely event that their valve cannot be successfully repaired, we discussed the possibility of replacing the mitral valve using a mechanical prosthesis with the attendant need for long-term anticoagulation versus the alternative of replacing it using a bioprosthetic tissue valve with its potential for late structural valve deterioration and failure, depending upon the patient's longevity.  The patient understands and accepts all potential risks of surgery including but not limited to risk of death, stroke or other neurologic complication, myocardial infarction, congestive heart failure, respiratory failure, renal failure, bleeding requiring transfusion and/or  reexploration, arrhythmia, infection or other wound complications, pneumonia, pleural and/or pericardial effusion, pulmonary embolus, aortic dissection or other major vascular complication, or delayed complications related to valve repair or replacement including but not limited to structural valve deterioration and failure, thrombosis, embolization, endocarditis, or paravalvular leak.  Alternative surgical approaches have been discussed including a comparison between conventional sternotomy and minimally-invasive techniques.  The relative risks and benefits of each have been reviewed as they pertain to the patient's specific circumstances, and all of their questions have been addressed.  Specific risks potentially related to the minimally-invasive approach were discussed at length, including but not limited to risk of conversion to full or partial sternotomy, aortic dissection or other major vascular complication, unilateral acute lung injury or pulmonary edema, phrenic nerve dysfunction or paralysis, rib fracture, chronic pain, lung hernia, or lymphocele.  All of their questions have been answered.  We plan to proceed with surgery on Wednesday, 09/18/2014.    I spent in excess of 15 minutes during the conduct of this office consultation and >50% of this time involved direct face-to-face encounter with the patient for counseling and/or coordination of their care.    Valentina Gu. Roxy Manns, MD 09/16/2014 6:39 PM

## 2014-09-17 LAB — HEMOGLOBIN A1C
HEMOGLOBIN A1C: 5.8 % — AB (ref 4.8–5.6)
MEAN PLASMA GLUCOSE: 120 mg/dL

## 2014-09-17 MED ORDER — POTASSIUM CHLORIDE 2 MEQ/ML IV SOLN
80.0000 meq | INTRAVENOUS | Status: DC
  Filled 2014-09-17: qty 40

## 2014-09-17 MED ORDER — SODIUM CHLORIDE 0.9 % IV SOLN
INTRAVENOUS | Status: AC
  Administered 2014-09-18: 69.8 mL/h via INTRAVENOUS
  Filled 2014-09-17: qty 40

## 2014-09-17 MED ORDER — MAGNESIUM SULFATE 50 % IJ SOLN
40.0000 meq | INTRAMUSCULAR | Status: DC
  Filled 2014-09-17: qty 10

## 2014-09-17 MED ORDER — DEXMEDETOMIDINE HCL IN NACL 400 MCG/100ML IV SOLN
0.1000 ug/kg/h | INTRAVENOUS | Status: AC
Start: 2014-09-18 — End: 2014-09-18
  Administered 2014-09-18: .3 ug/kg/h via INTRAVENOUS
  Filled 2014-09-17: qty 100

## 2014-09-17 MED ORDER — SODIUM CHLORIDE 0.9 % IV SOLN
INTRAVENOUS | Status: AC
  Administered 2014-09-18: 1 [IU]/h via INTRAVENOUS
  Filled 2014-09-17: qty 2.5

## 2014-09-17 MED ORDER — DEXTROSE 5 % IV SOLN
750.0000 mg | INTRAVENOUS | Status: DC
  Filled 2014-09-17: qty 750

## 2014-09-17 MED ORDER — PLASMA-LYTE 148 IV SOLN
INTRAVENOUS | Status: AC
  Administered 2014-09-18: 500 mL
  Filled 2014-09-17: qty 2.5

## 2014-09-17 MED ORDER — DOPAMINE-DEXTROSE 3.2-5 MG/ML-% IV SOLN
0.0000 ug/kg/min | INTRAVENOUS | Status: DC
Start: 2014-09-18 — End: 2014-09-18
  Filled 2014-09-17: qty 250

## 2014-09-17 MED ORDER — METOPROLOL TARTRATE 12.5 MG HALF TABLET: 12.5000 mg | ORAL_TABLET | ORAL | Status: DC

## 2014-09-17 MED ORDER — DEXTROSE 5 % IV SOLN
30.0000 ug/min | INTRAVENOUS | Status: AC
  Administered 2014-09-18: 20 ug/min via INTRAVENOUS
  Filled 2014-09-17: qty 2

## 2014-09-17 MED ORDER — VANCOMYCIN HCL 1000 MG IV SOLR
INTRAVENOUS | Status: AC
  Administered 2014-09-18: 1000 mL
  Filled 2014-09-17: qty 1000

## 2014-09-17 MED ORDER — DEXTROSE 5 % IV SOLN
1.5000 g | INTRAVENOUS | Status: AC
  Administered 2014-09-18: 1.5 g via INTRAVENOUS
  Administered 2014-09-18: .75 g via INTRAVENOUS
  Filled 2014-09-17: qty 1.5

## 2014-09-17 MED ORDER — EPINEPHRINE HCL 1 MG/ML IJ SOLN
0.0000 ug/min | INTRAMUSCULAR | Status: DC
  Filled 2014-09-17: qty 4

## 2014-09-17 MED ORDER — SODIUM CHLORIDE 0.9 % IV SOLN
INTRAVENOUS | Status: DC
  Filled 2014-09-17: qty 30

## 2014-09-17 MED ORDER — CHLORHEXIDINE GLUCONATE 4 % EX LIQD
30.0000 mL | CUTANEOUS | Status: DC
  Filled 2014-09-17: qty 30

## 2014-09-17 MED ORDER — NITROGLYCERIN IN D5W 200-5 MCG/ML-% IV SOLN
2.0000 ug/min | INTRAVENOUS | Status: AC
  Administered 2014-09-18: 10 ug/min via INTRAVENOUS
  Filled 2014-09-17: qty 250

## 2014-09-17 MED ORDER — VANCOMYCIN HCL 10 G IV SOLR
1250.0000 mg | INTRAVENOUS | Status: AC
  Administered 2014-09-18: 1250 mg via INTRAVENOUS
  Filled 2014-09-17: qty 1250

## 2014-09-18 ENCOUNTER — Inpatient Hospital Stay (HOSPITAL_COMMUNITY): Payer: Medicare Other | Admitting: Certified Registered Nurse Anesthetist

## 2014-09-18 ENCOUNTER — Encounter (HOSPITAL_COMMUNITY)
Admission: AD | Disposition: A | Discharge status: Home / Self Care | Payer: Medicare Other | Source: Ambulatory Visit | Attending: Thoracic Surgery (Cardiothoracic Vascular Surgery)

## 2014-09-18 ENCOUNTER — Inpatient Hospital Stay (HOSPITAL_COMMUNITY): Payer: Medicare Other

## 2014-09-18 ENCOUNTER — Inpatient Hospital Stay (HOSPITAL_COMMUNITY)
Admission: AD | Admit: 2014-09-18 | Discharge: 2014-09-22 | DRG: 219 | Disposition: A | Discharge status: Home / Self Care | Payer: Medicare Other | Source: Ambulatory Visit | Attending: Thoracic Surgery (Cardiothoracic Vascular Surgery) | Admitting: Thoracic Surgery (Cardiothoracic Vascular Surgery)

## 2014-09-18 ENCOUNTER — Ambulatory Visit (HOSPITAL_COMMUNITY): Payer: Medicare Other

## 2014-09-18 ENCOUNTER — Encounter (HOSPITAL_COMMUNITY): Payer: Self-pay | Admitting: General Practice

## 2014-09-18 DIAGNOSIS — I511 Rupture of chordae tendineae, not elsewhere classified: Secondary | ICD-10-CM | POA: Diagnosis present

## 2014-09-18 DIAGNOSIS — I272 Other secondary pulmonary hypertension: Secondary | ICD-10-CM | POA: Diagnosis present

## 2014-09-18 DIAGNOSIS — E559 Vitamin D deficiency, unspecified: Secondary | ICD-10-CM | POA: Diagnosis present

## 2014-09-18 DIAGNOSIS — D6959 Other secondary thrombocytopenia: Secondary | ICD-10-CM | POA: Diagnosis not present

## 2014-09-18 DIAGNOSIS — G4730 Sleep apnea, unspecified: Secondary | ICD-10-CM | POA: Diagnosis present

## 2014-09-18 DIAGNOSIS — I340 Nonrheumatic mitral (valve) insufficiency: Secondary | ICD-10-CM | POA: Diagnosis present

## 2014-09-18 DIAGNOSIS — J9811 Atelectasis: Secondary | ICD-10-CM | POA: Diagnosis not present

## 2014-09-18 DIAGNOSIS — N289 Disorder of kidney and ureter, unspecified: Secondary | ICD-10-CM | POA: Diagnosis not present

## 2014-09-18 DIAGNOSIS — J45909 Unspecified asthma, uncomplicated: Secondary | ICD-10-CM | POA: Diagnosis present

## 2014-09-18 DIAGNOSIS — Z79899 Other long term (current) drug therapy: Secondary | ICD-10-CM

## 2014-09-18 DIAGNOSIS — R011 Cardiac murmur, unspecified: Secondary | ICD-10-CM | POA: Diagnosis present

## 2014-09-18 DIAGNOSIS — J939 Pneumothorax, unspecified: Secondary | ICD-10-CM

## 2014-09-18 DIAGNOSIS — Z9889 Other specified postprocedural states: Secondary | ICD-10-CM

## 2014-09-18 DIAGNOSIS — D62 Acute posthemorrhagic anemia: Secondary | ICD-10-CM | POA: Diagnosis not present

## 2014-09-18 DIAGNOSIS — E8770 Fluid overload, unspecified: Secondary | ICD-10-CM | POA: Diagnosis not present

## 2014-09-18 DIAGNOSIS — N189 Chronic kidney disease, unspecified: Secondary | ICD-10-CM | POA: Diagnosis present

## 2014-09-18 DIAGNOSIS — J9589 Other postprocedural complications and disorders of respiratory system, not elsewhere classified: Secondary | ICD-10-CM | POA: Diagnosis not present

## 2014-09-18 HISTORY — PX: TEE WITHOUT CARDIOVERSION: SHX5443

## 2014-09-18 HISTORY — DX: Other specified postprocedural states: Z98.890

## 2014-09-18 HISTORY — PX: MITRAL VALVE REPAIR: SHX2039

## 2014-09-18 LAB — POCT I-STAT, CHEM 8
BUN: 19 mg/dL (ref 6–20)
BUN: 17 mg/dL (ref 6–20)
BUN: 15 mg/dL (ref 6–20)
BUN: 15 mg/dL (ref 6–20)
BUN: 14 mg/dL (ref 6–20)
BUN: 14 mg/dL (ref 6–20)
CALCIUM ION: 1.18 mmol/L (ref 1.13–1.30)
CALCIUM ION: 1.09 mmol/L — AB (ref 1.13–1.30)
CALCIUM ION: 1.11 mmol/L — AB (ref 1.13–1.30)
CALCIUM ION: 1.02 mmol/L — AB (ref 1.13–1.30)
CHLORIDE: 105 mmol/L (ref 101–111)
CHLORIDE: 105 mmol/L (ref 101–111)
CREATININE: 1.5 mg/dL — AB (ref 0.61–1.24)
CREATININE: 1.2 mg/dL (ref 0.61–1.24)
Calcium, Ion: 1.17 mmol/L (ref 1.13–1.30)
Calcium, Ion: 1.14 mmol/L (ref 1.13–1.30)
Chloride: 100 mmol/L — ABNORMAL LOW (ref 101–111)
Chloride: 105 mmol/L (ref 101–111)
Chloride: 107 mmol/L (ref 101–111)
Chloride: 105 mmol/L (ref 101–111)
Creatinine, Ser: 1.6 mg/dL — ABNORMAL HIGH (ref 0.61–1.24)
Creatinine, Ser: 1.2 mg/dL (ref 0.61–1.24)
Creatinine, Ser: 1.3 mg/dL — ABNORMAL HIGH (ref 0.61–1.24)
Creatinine, Ser: 1.3 mg/dL — ABNORMAL HIGH (ref 0.61–1.24)
GLUCOSE: 110 mg/dL — AB (ref 65–99)
GLUCOSE: 152 mg/dL — AB (ref 65–99)
GLUCOSE: 128 mg/dL — AB (ref 65–99)
Glucose, Bld: 116 mg/dL — ABNORMAL HIGH (ref 65–99)
Glucose, Bld: 107 mg/dL — ABNORMAL HIGH (ref 65–99)
Glucose, Bld: 184 mg/dL — ABNORMAL HIGH (ref 65–99)
HCT: 39 % (ref 39.0–52.0)
HCT: 31 % — ABNORMAL LOW (ref 39.0–52.0)
HCT: 31 % — ABNORMAL LOW (ref 39.0–52.0)
HCT: 29 % — ABNORMAL LOW (ref 39.0–52.0)
HCT: 41 % (ref 39.0–52.0)
HEMATOCRIT: 39 % (ref 39.0–52.0)
HEMOGLOBIN: 10.5 g/dL — AB (ref 13.0–17.0)
HEMOGLOBIN: 10.5 g/dL — AB (ref 13.0–17.0)
Hemoglobin: 13.3 g/dL (ref 13.0–17.0)
Hemoglobin: 13.3 g/dL (ref 13.0–17.0)
Hemoglobin: 9.9 g/dL — ABNORMAL LOW (ref 13.0–17.0)
Hemoglobin: 13.9 g/dL (ref 13.0–17.0)
POTASSIUM: 4.3 mmol/L (ref 3.5–5.1)
Potassium: 4.1 mmol/L (ref 3.5–5.1)
Potassium: 3.6 mmol/L (ref 3.5–5.1)
Potassium: 4.1 mmol/L (ref 3.5–5.1)
Potassium: 4.2 mmol/L (ref 3.5–5.1)
Potassium: 4.6 mmol/L (ref 3.5–5.1)
SODIUM: 142 mmol/L (ref 135–145)
SODIUM: 142 mmol/L (ref 135–145)
SODIUM: 139 mmol/L (ref 135–145)
Sodium: 141 mmol/L (ref 135–145)
Sodium: 142 mmol/L (ref 135–145)
Sodium: 140 mmol/L (ref 135–145)
TCO2: 27 mmol/L (ref 0–100)
TCO2: 24 mmol/L (ref 0–100)
TCO2: 25 mmol/L (ref 0–100)
TCO2: 23 mmol/L (ref 0–100)
TCO2: 22 mmol/L (ref 0–100)
TCO2: 22 mmol/L (ref 0–100)

## 2014-09-18 LAB — POCT I-STAT 3, ART BLOOD GAS (G3+)
ACID-BASE DEFICIT: 6 mmol/L — AB (ref 0.0–2.0)
ACID-BASE DEFICIT: 3 mmol/L — AB (ref 0.0–2.0)
ACID-BASE EXCESS: 1 mmol/L (ref 0.0–2.0)
Acid-base deficit: 3 mmol/L — ABNORMAL HIGH (ref 0.0–2.0)
BICARBONATE: 25.2 meq/L — AB (ref 20.0–24.0)
BICARBONATE: 21.4 meq/L (ref 20.0–24.0)
BICARBONATE: 23.2 meq/L (ref 20.0–24.0)
Bicarbonate: 25.8 mEq/L — ABNORMAL HIGH (ref 20.0–24.0)
Bicarbonate: 21.2 mEq/L (ref 20.0–24.0)
O2 SAT: 97 %
O2 SAT: 100 %
O2 SAT: 91 %
O2 SAT: 94 %
O2 SAT: 97 %
PCO2 ART: 43.3 mmHg (ref 35.0–45.0)
PCO2 ART: 42.4 mmHg (ref 35.0–45.0)
PCO2 ART: 31.1 mmHg — AB (ref 35.0–45.0)
PO2 ART: 95 mmHg (ref 80.0–100.0)
PO2 ART: 53 mmHg — AB (ref 80.0–100.0)
PO2 ART: 80 mmHg (ref 80.0–100.0)
PO2 ART: 96 mmHg (ref 80.0–100.0)
Patient temperature: 35.2
TCO2: 26 mmol/L (ref 0–100)
TCO2: 27 mmol/L (ref 0–100)
TCO2: 22 mmol/L (ref 0–100)
TCO2: 23 mmol/L (ref 0–100)
TCO2: 25 mmol/L (ref 0–100)
pCO2 arterial: 46.6 mmHg — ABNORMAL HIGH (ref 35.0–45.0)
pCO2 arterial: 44.4 mmHg (ref 35.0–45.0)
pH, Arterial: 7.372 (ref 7.350–7.450)
pH, Arterial: 7.393 (ref 7.350–7.450)
pH, Arterial: 7.434 (ref 7.350–7.450)
pH, Arterial: 7.27 — ABNORMAL LOW (ref 7.350–7.450)
pH, Arterial: 7.329 — ABNORMAL LOW (ref 7.350–7.450)
pO2, Arterial: 311 mmHg — ABNORMAL HIGH (ref 80.0–100.0)

## 2014-09-18 LAB — HEMOGLOBIN AND HEMATOCRIT, BLOOD
HEMATOCRIT: 30.4 % — AB (ref 39.0–52.0)
HEMOGLOBIN: 10.2 g/dL — AB (ref 13.0–17.0)

## 2014-09-18 LAB — POCT I-STAT 4, (NA,K, GLUC, HGB,HCT)
Glucose, Bld: 103 mg/dL — ABNORMAL HIGH (ref 65–99)
HCT: 42 % (ref 39.0–52.0)
Hemoglobin: 14.3 g/dL (ref 13.0–17.0)
POTASSIUM: 4.1 mmol/L (ref 3.5–5.1)
SODIUM: 141 mmol/L (ref 135–145)

## 2014-09-18 LAB — CBC
HCT: 41.4 % (ref 39.0–52.0)
HEMATOCRIT: 39.1 % (ref 39.0–52.0)
HEMOGLOBIN: 13.3 g/dL (ref 13.0–17.0)
Hemoglobin: 14.3 g/dL (ref 13.0–17.0)
MCH: 32.3 pg (ref 26.0–34.0)
MCH: 32.1 pg (ref 26.0–34.0)
MCHC: 34.5 g/dL (ref 30.0–36.0)
MCHC: 34 g/dL (ref 30.0–36.0)
MCV: 93.5 fL (ref 78.0–100.0)
MCV: 94.4 fL (ref 78.0–100.0)
PLATELETS: 132 10*3/uL — AB (ref 150–400)
Platelets: 137 10*3/uL — ABNORMAL LOW (ref 150–400)
RBC: 4.43 MIL/uL (ref 4.22–5.81)
RBC: 4.14 MIL/uL — ABNORMAL LOW (ref 4.22–5.81)
RDW: 12.2 % (ref 11.5–15.5)
RDW: 12.2 % (ref 11.5–15.5)
WBC: 15 10*3/uL — AB (ref 4.0–10.5)
WBC: 15.7 10*3/uL — AB (ref 4.0–10.5)

## 2014-09-18 LAB — POCT I-STAT GLUCOSE
GLUCOSE: 142 mg/dL — AB (ref 65–99)
OPERATOR ID: 3406

## 2014-09-18 LAB — CREATININE, SERUM
Creatinine, Ser: 1.44 mg/dL — ABNORMAL HIGH (ref 0.61–1.24)
GFR, EST AFRICAN AMERICAN: 56 mL/min — AB (ref >60)
GFR, EST NON AFRICAN AMERICAN: 48 mL/min — AB (ref >60)

## 2014-09-18 LAB — GLUCOSE, CAPILLARY
GLUCOSE-CAPILLARY: 82 mg/dL (ref 65–99)
Glucose-Capillary: 138 mg/dL — ABNORMAL HIGH (ref 65–99)

## 2014-09-18 LAB — PROTIME-INR
INR: 1.37 (ref 0.00–1.49)
Prothrombin Time: 17 seconds — ABNORMAL HIGH (ref 11.6–15.2)

## 2014-09-18 LAB — APTT: aPTT: 38 seconds — ABNORMAL HIGH (ref 24–37)

## 2014-09-18 LAB — PLATELET COUNT: Platelets: 153 10*3/uL (ref 150–400)

## 2014-09-18 LAB — MAGNESIUM: Magnesium: 2.9 mg/dL — ABNORMAL HIGH (ref 1.7–2.4)

## 2014-09-18 SURGERY — REPAIR, MITRAL VALVE, MINIMALLY INVASIVE
Anesthesia: General | Site: Chest | Laterality: Right

## 2014-09-18 MED ORDER — ROCURONIUM BROMIDE 100 MG/10ML IV SOLN
INTRAVENOUS | Status: DC | PRN
  Administered 2014-09-18: 50 mg via INTRAVENOUS
  Administered 2014-09-18: 100 mg via INTRAVENOUS
  Administered 2014-09-18: 30 mg via INTRAVENOUS
  Administered 2014-09-18: 50 mg via INTRAVENOUS

## 2014-09-18 MED ORDER — FENTANYL CITRATE (PF) 250 MCG/5ML IJ SOLN
INTRAMUSCULAR | Status: AC
  Filled 2014-09-18: qty 5

## 2014-09-18 MED ORDER — LACTATED RINGERS IV SOLN
INTRAVENOUS | Status: DC | PRN
  Administered 2014-09-18 (×2): via INTRAVENOUS

## 2014-09-18 MED ORDER — LACTATED RINGERS IV SOLN
INTRAVENOUS | Status: DC | PRN
  Administered 2014-09-18: 08:00:00 via INTRAVENOUS

## 2014-09-18 MED ORDER — METOPROLOL TARTRATE 25 MG/10 ML ORAL SUSPENSION
12.5000 mg | Freq: Two times a day (BID) | ORAL | Status: DC
  Filled 2014-09-18 (×3): qty 5

## 2014-09-18 MED ORDER — HEPARIN SODIUM (PORCINE) 1000 UNIT/ML IJ SOLN
INTRAMUSCULAR | Status: AC
  Filled 2014-09-18: qty 1

## 2014-09-18 MED ORDER — ROCURONIUM BROMIDE 50 MG/5ML IV SOLN
INTRAVENOUS | Status: AC
  Filled 2014-09-18: qty 5

## 2014-09-18 MED ORDER — ACETAMINOPHEN 160 MG/5ML PO SOLN: 1000.0000 mg | Freq: Four times a day (QID) | ORAL | Status: DC

## 2014-09-18 MED ORDER — INSULIN REGULAR BOLUS VIA INFUSION
0.0000 [IU] | Freq: Three times a day (TID) | INTRAVENOUS | Status: DC
Start: 2014-09-19 — End: 2014-09-18
  Filled 2014-09-18: qty 10

## 2014-09-18 MED ORDER — TRAMADOL HCL 50 MG PO TABS
50.0000 mg | ORAL_TABLET | ORAL | Status: DC | PRN
  Administered 2014-09-21: 50 mg via ORAL
  Filled 2014-09-18 (×2): qty 1

## 2014-09-18 MED ORDER — MORPHINE SULFATE (PF) 2 MG/ML IV SOLN: 1.0000 mg | INTRAVENOUS | Status: AC | PRN

## 2014-09-18 MED ORDER — SODIUM CHLORIDE 0.9 % IV SOLN
INTRAVENOUS | Status: DC
  Administered 2014-09-18: 18:00:00 via INTRAVENOUS

## 2014-09-18 MED ORDER — METOPROLOL TARTRATE 12.5 MG HALF TABLET
12.5000 mg | ORAL_TABLET | Freq: Two times a day (BID) | ORAL | Status: DC
  Administered 2014-09-20 – 2014-09-21 (×3): 12.5 mg via ORAL
  Filled 2014-09-18 (×11): qty 1

## 2014-09-18 MED ORDER — PROTAMINE SULFATE 10 MG/ML IV SOLN
INTRAVENOUS | Status: DC | PRN
  Administered 2014-09-18: 200 mg via INTRAVENOUS

## 2014-09-18 MED ORDER — SODIUM BICARBONATE 8.4 % IV SOLN
50.0000 meq | Freq: Once | INTRAVENOUS | Status: AC
  Administered 2014-09-18: 50 meq via INTRAVENOUS
  Filled 2014-09-18: qty 50

## 2014-09-18 MED ORDER — SODIUM CHLORIDE 0.45 % IV SOLN
INTRAVENOUS | Status: DC | PRN
  Administered 2014-09-18: 17:00:00 via INTRAVENOUS

## 2014-09-18 MED ORDER — SODIUM CHLORIDE 0.9 % IV SOLN
INTRAVENOUS | Status: DC | PRN
  Administered 2014-09-18 (×2): via INTRAVENOUS

## 2014-09-18 MED ORDER — ASPIRIN EC 325 MG PO TBEC
325.0000 mg | DELAYED_RELEASE_TABLET | Freq: Every day | ORAL | Status: DC
  Administered 2014-09-19 – 2014-09-20 (×2): 325 mg via ORAL
  Filled 2014-09-18 (×3): qty 1

## 2014-09-18 MED ORDER — ANTISEPTIC ORAL RINSE SOLUTION (CORINZ)
7.0000 mL | Freq: Four times a day (QID) | OROMUCOSAL | Status: DC
  Administered 2014-09-19: 7 mL via OROMUCOSAL

## 2014-09-18 MED ORDER — SODIUM CHLORIDE 0.9 % IJ SOLN: 3.0000 mL | INTRAMUSCULAR | Status: DC | PRN

## 2014-09-18 MED ORDER — VANCOMYCIN HCL IN DEXTROSE 1-5 GM/200ML-% IV SOLN
1000.0000 mg | Freq: Once | INTRAVENOUS | Status: AC
  Administered 2014-09-18: 1000 mg via INTRAVENOUS
  Filled 2014-09-18: qty 200

## 2014-09-18 MED ORDER — ASPIRIN 81 MG PO CHEW: 324.0000 mg | CHEWABLE_TABLET | Freq: Every day | ORAL | Status: DC

## 2014-09-18 MED ORDER — MIDAZOLAM HCL 5 MG/5ML IJ SOLN
INTRAMUSCULAR | Status: DC | PRN
  Administered 2014-09-18: 1 mg via INTRAVENOUS
  Administered 2014-09-18 (×2): 2 mg via INTRAVENOUS
  Administered 2014-09-18: 3 mg via INTRAVENOUS
  Administered 2014-09-18: 2 mg via INTRAVENOUS
  Administered 2014-09-18 (×2): 1 mg via INTRAVENOUS

## 2014-09-18 MED ORDER — ACETAMINOPHEN 650 MG RE SUPP
650.0000 mg | Freq: Once | RECTAL | Status: AC
  Administered 2014-09-18: 650 mg via RECTAL

## 2014-09-18 MED ORDER — CHLORHEXIDINE GLUCONATE 0.12 % MT SOLN
OROMUCOSAL | Status: AC
  Administered 2014-09-18: 15 mL via OROMUCOSAL
  Filled 2014-09-18: qty 15

## 2014-09-18 MED ORDER — SODIUM CHLORIDE 0.9 % IJ SOLN
3.0000 mL | Freq: Two times a day (BID) | INTRAMUSCULAR | Status: DC
Start: 2014-09-19 — End: 2014-09-22
  Administered 2014-09-19: 10 mL via INTRAVENOUS
  Administered 2014-09-19 – 2014-09-21 (×2): 3 mL via INTRAVENOUS

## 2014-09-18 MED ORDER — 0.9 % SODIUM CHLORIDE (POUR BTL) OPTIME
TOPICAL | Status: DC | PRN
  Administered 2014-09-18: 6000 mL

## 2014-09-18 MED ORDER — POTASSIUM CHLORIDE 10 MEQ/50ML IV SOLN
10.0000 meq | INTRAVENOUS | Status: AC
Start: 2014-09-18 — End: 2014-09-18

## 2014-09-18 MED ORDER — OXYCODONE HCL 5 MG PO TABS
5.0000 mg | ORAL_TABLET | ORAL | Status: DC | PRN
  Administered 2014-09-19 – 2014-09-20 (×5): 5 mg via ORAL
  Filled 2014-09-18: qty 1
  Filled 2014-09-18: qty 2
  Filled 2014-09-18 (×3): qty 1

## 2014-09-18 MED ORDER — MAGNESIUM SULFATE 4 GM/100ML IV SOLN
4.0000 g | Freq: Once | INTRAVENOUS | Status: AC
  Administered 2014-09-18: 4 g via INTRAVENOUS
  Filled 2014-09-18: qty 100

## 2014-09-18 MED ORDER — INSULIN ASPART 100 UNIT/ML ~~LOC~~ SOLN
0.0000 [IU] | SUBCUTANEOUS | Status: DC
  Administered 2014-09-19 – 2014-09-20 (×2): 2 [IU] via SUBCUTANEOUS

## 2014-09-18 MED ORDER — CHLORHEXIDINE GLUCONATE 0.12% ORAL RINSE (MEDLINE KIT)
15.0000 mL | Freq: Two times a day (BID) | OROMUCOSAL | Status: DC
  Administered 2014-09-18 – 2014-09-19 (×3): 15 mL via OROMUCOSAL

## 2014-09-18 MED ORDER — BISACODYL 10 MG RE SUPP: 10.0000 mg | Freq: Every day | RECTAL | Status: DC

## 2014-09-18 MED ORDER — BISACODYL 5 MG PO TBEC
10.0000 mg | DELAYED_RELEASE_TABLET | Freq: Every day | ORAL | Status: DC
  Administered 2014-09-19 – 2014-09-20 (×2): 10 mg via ORAL
  Filled 2014-09-18 (×3): qty 2

## 2014-09-18 MED ORDER — PROPOFOL 10 MG/ML IV BOLUS
INTRAVENOUS | Status: AC
  Filled 2014-09-18: qty 20

## 2014-09-18 MED ORDER — SODIUM CHLORIDE 0.9 % IV SOLN
250.0000 mL | INTRAVENOUS | Status: DC
  Administered 2014-09-19: 250 mL via INTRAVENOUS

## 2014-09-18 MED ORDER — ACETAMINOPHEN 500 MG PO TABS
1000.0000 mg | ORAL_TABLET | Freq: Four times a day (QID) | ORAL | Status: DC
Start: 2014-09-19 — End: 2014-09-22
  Administered 2014-09-19 – 2014-09-22 (×8): 1000 mg via ORAL
  Filled 2014-09-18 (×20): qty 2

## 2014-09-18 MED ORDER — DEXMEDETOMIDINE HCL IN NACL 200 MCG/50ML IV SOLN
INTRAVENOUS | Status: AC
  Filled 2014-09-18: qty 50

## 2014-09-18 MED ORDER — MIDAZOLAM HCL 2 MG/2ML IJ SOLN
2.0000 mg | INTRAMUSCULAR | Status: DC | PRN
Start: 2014-09-18 — End: 2014-09-18

## 2014-09-18 MED ORDER — LACTATED RINGERS IV SOLN
INTRAVENOUS | Status: DC
  Administered 2014-09-18: 17:00:00 via INTRAVENOUS

## 2014-09-18 MED ORDER — PANTOPRAZOLE SODIUM 40 MG PO TBEC
40.0000 mg | DELAYED_RELEASE_TABLET | Freq: Every day | ORAL | Status: DC
  Administered 2014-09-20: 40 mg via ORAL
  Filled 2014-09-18 (×4): qty 1

## 2014-09-18 MED ORDER — LIDOCAINE HCL (CARDIAC) 20 MG/ML IV SOLN
INTRAVENOUS | Status: AC
  Filled 2014-09-18: qty 5

## 2014-09-18 MED ORDER — FENTANYL CITRATE (PF) 100 MCG/2ML IJ SOLN
INTRAMUSCULAR | Status: DC | PRN
  Administered 2014-09-18 (×2): 100 ug via INTRAVENOUS
  Administered 2014-09-18: 150 ug via INTRAVENOUS
  Administered 2014-09-18: 100 ug via INTRAVENOUS
  Administered 2014-09-18: 200 ug via INTRAVENOUS
  Administered 2014-09-18 (×2): 150 ug via INTRAVENOUS
  Administered 2014-09-18: 100 ug via INTRAVENOUS
  Administered 2014-09-18 (×2): 50 ug via INTRAVENOUS
  Administered 2014-09-18: 100 ug via INTRAVENOUS
  Administered 2014-09-18 (×5): 150 ug via INTRAVENOUS

## 2014-09-18 MED ORDER — DEXMEDETOMIDINE HCL IN NACL 200 MCG/50ML IV SOLN
0.0000 ug/kg/h | INTRAVENOUS | Status: DC
  Administered 2014-09-18: 0.4 ug/kg/h via INTRAVENOUS

## 2014-09-18 MED ORDER — ACETAMINOPHEN 160 MG/5ML PO SOLN: 650.0000 mg | Freq: Once | ORAL | Status: AC

## 2014-09-18 MED ORDER — PHENYLEPHRINE 40 MCG/ML (10ML) SYRINGE FOR IV PUSH (FOR BLOOD PRESSURE SUPPORT)
PREFILLED_SYRINGE | INTRAVENOUS | Status: AC
  Filled 2014-09-18: qty 10

## 2014-09-18 MED ORDER — HEPARIN SODIUM (PORCINE) 1000 UNIT/ML IJ SOLN
INTRAMUSCULAR | Status: DC | PRN
  Administered 2014-09-18: 23000 [IU] via INTRAVENOUS

## 2014-09-18 MED ORDER — EPHEDRINE SULFATE 50 MG/ML IJ SOLN
INTRAMUSCULAR | Status: AC
  Filled 2014-09-18: qty 1

## 2014-09-18 MED ORDER — EPHEDRINE SULFATE 50 MG/ML IJ SOLN
INTRAMUSCULAR | Status: DC | PRN
  Administered 2014-09-18: 5 mg via INTRAVENOUS
  Administered 2014-09-18: 10 mg via INTRAVENOUS

## 2014-09-18 MED ORDER — DEXTROSE 5 % IV SOLN
1.5000 g | Freq: Two times a day (BID) | INTRAVENOUS | Status: AC
  Administered 2014-09-18 – 2014-09-20 (×4): 1.5 g via INTRAVENOUS
  Filled 2014-09-18 (×4): qty 1.5

## 2014-09-18 MED ORDER — DOCUSATE SODIUM 100 MG PO CAPS
200.0000 mg | ORAL_CAPSULE | Freq: Every day | ORAL | Status: DC
  Administered 2014-09-19 – 2014-09-22 (×4): 200 mg via ORAL
  Filled 2014-09-18 (×4): qty 2

## 2014-09-18 MED ORDER — PHENYLEPHRINE HCL 10 MG/ML IJ SOLN: 0.0000 ug/min | INTRAVENOUS | Status: DC

## 2014-09-18 MED ORDER — MIDAZOLAM HCL 2 MG/2ML IJ SOLN
INTRAMUSCULAR | Status: AC
  Filled 2014-09-18: qty 4

## 2014-09-18 MED ORDER — SODIUM CHLORIDE 0.9 % IV SOLN
INTRAVENOUS | Status: DC
  Filled 2014-09-18: qty 2.5

## 2014-09-18 MED ORDER — METOPROLOL TARTRATE 1 MG/ML IV SOLN: 2.5000 mg | INTRAVENOUS | Status: DC | PRN

## 2014-09-18 MED ORDER — ALBUMIN HUMAN 5 % IV SOLN
250.0000 mL | INTRAVENOUS | Status: AC | PRN
Start: 2014-09-18 — End: 2014-09-19
  Administered 2014-09-18 (×2): 250 mL via INTRAVENOUS

## 2014-09-18 MED ORDER — PROPOFOL 10 MG/ML IV BOLUS
INTRAVENOUS | Status: DC | PRN
  Administered 2014-09-18: 100 mg via INTRAVENOUS

## 2014-09-18 MED ORDER — PROTAMINE SULFATE 10 MG/ML IV SOLN
INTRAVENOUS | Status: AC
  Filled 2014-09-18: qty 25

## 2014-09-18 MED ORDER — MIDAZOLAM HCL 10 MG/2ML IJ SOLN
INTRAMUSCULAR | Status: AC
  Filled 2014-09-18: qty 4

## 2014-09-18 MED ORDER — ARTIFICIAL TEARS OP OINT
TOPICAL_OINTMENT | OPHTHALMIC | Status: DC | PRN
  Administered 2014-09-18: 1 via OPHTHALMIC

## 2014-09-18 MED ORDER — ONDANSETRON HCL 4 MG/2ML IJ SOLN
4.0000 mg | Freq: Four times a day (QID) | INTRAMUSCULAR | Status: DC | PRN
  Administered 2014-09-19 – 2014-09-20 (×2): 4 mg via INTRAVENOUS
  Filled 2014-09-18 (×2): qty 2

## 2014-09-18 MED ORDER — NITROGLYCERIN IN D5W 200-5 MCG/ML-% IV SOLN: 0.0000 ug/min | INTRAVENOUS | Status: DC

## 2014-09-18 MED ORDER — SODIUM BICARBONATE 8.4 % IV SOLN
50.0000 meq | INTRAVENOUS | Status: DC | PRN
  Administered 2014-09-18: 50 meq via INTRAVENOUS

## 2014-09-18 MED ORDER — SODIUM CHLORIDE 0.9 % IV SOLN
INTRAVENOUS | Status: DC
  Administered 2014-09-18: 17:00:00 via INTRAVENOUS

## 2014-09-18 MED ORDER — SODIUM CHLORIDE 0.9 % IJ SOLN
INTRAMUSCULAR | Status: AC
  Filled 2014-09-18: qty 10

## 2014-09-18 MED ORDER — LACTATED RINGERS IV SOLN: 500.0000 mL | Freq: Once | INTRAVENOUS | Status: DC | PRN

## 2014-09-18 MED ORDER — MORPHINE SULFATE (PF) 2 MG/ML IV SOLN: 2.0000 mg | INTRAVENOUS | Status: DC | PRN

## 2014-09-18 MED ORDER — PHENYLEPHRINE HCL 10 MG/ML IJ SOLN
10.0000 mg | INTRAVENOUS | Status: DC | PRN
  Administered 2014-09-18: 10 ug/min via INTRAVENOUS

## 2014-09-18 MED ORDER — ALBUMIN HUMAN 5 % IV SOLN
INTRAVENOUS | Status: DC | PRN
  Administered 2014-09-18: 15:00:00 via INTRAVENOUS

## 2014-09-18 MED ORDER — FAMOTIDINE IN NACL 20-0.9 MG/50ML-% IV SOLN
20.0000 mg | Freq: Two times a day (BID) | INTRAVENOUS | Status: AC
Start: 2014-09-18 — End: 2014-09-19
  Administered 2014-09-18: 20 mg via INTRAVENOUS

## 2014-09-18 MED FILL — Magnesium Sulfate Inj 50%: INTRAMUSCULAR | Qty: 10 | Status: AC

## 2014-09-18 MED FILL — Potassium Chloride Inj 2 mEq/ML: INTRAVENOUS | Qty: 40 | Status: AC

## 2014-09-18 MED FILL — Heparin Sodium (Porcine) Inj 1000 Unit/ML: INTRAMUSCULAR | Qty: 30 | Status: AC

## 2014-09-18 SURGICAL SUPPLY — 97 items
ADAPTER CARDIO PERF ANTE/RETRO (ADAPTER) ×3 IMPLANT
ADULT CANNULA AND INTRODUCER ×3 IMPLANT
BAG DECANTER FOR FLEXI CONT (MISCELLANEOUS) ×6 IMPLANT
BLADE SURG 11 STRL SS (BLADE) ×3 IMPLANT
BOOT SUTURE AID YELLOW STND (SUTURE) ×3 IMPLANT
CANISTER SUCTION 2500CC (MISCELLANEOUS) ×6 IMPLANT
CANNULA BIO-MED ART (CANNULA) ×3 IMPLANT
CANNULA BIO-MED VEN (CANNULA) ×3 IMPLANT
CANNULA FEM VENOUS REMOTE 22FR (CANNULA) ×3 IMPLANT
CANNULA FEMORAL ART 14 SM (MISCELLANEOUS) ×3 IMPLANT
CANNULA GUNDRY RCSP 15FR (MISCELLANEOUS) ×3 IMPLANT
CANNULA OPTISITE PERFUSION 16F (CANNULA) IMPLANT
CANNULA OPTISITE PERFUSION 18F (CANNULA) ×3 IMPLANT
CANNULA SUMP PERICARDIAL (CANNULA) ×6 IMPLANT
CATH KIT ON Q 5IN SLV (PAIN MANAGEMENT) IMPLANT
CONN ST 1/4X3/8  BEN (MISCELLANEOUS) ×2
CONN ST 1/4X3/8 BEN (MISCELLANEOUS) ×4 IMPLANT
CONNECTOR 1/2X3/8X1/2 3 WAY (MISCELLANEOUS) ×2
CONNECTOR 1/2X3/8X1/2 3WAY (MISCELLANEOUS) ×4 IMPLANT
CONT SPEC 4OZ CLIKSEAL STRL BL (MISCELLANEOUS) ×3 IMPLANT
CONT SPEC STER OR (MISCELLANEOUS) ×3 IMPLANT
COVER BACK TABLE 24X17X13 BIG (DRAPES) ×3 IMPLANT
COVER PROBE W GEL 5X96 (DRAPES) ×3 IMPLANT
CRADLE DONUT ADULT HEAD (MISCELLANEOUS) ×3 IMPLANT
DERMABOND ADVANCED (GAUZE/BANDAGES/DRESSINGS) ×2
DERMABOND ADVANCED .7 DNX12 (GAUZE/BANDAGES/DRESSINGS) ×4 IMPLANT
DEVICE PMI PUNCTURE CLOSURE (MISCELLANEOUS) ×3 IMPLANT
DEVICE SUT CK QUICK LOAD MINI (Prosthesis & Implant Heart) ×12 IMPLANT
DEVICE TROCAR PUNCTURE CLOSURE (ENDOMECHANICALS) ×3 IMPLANT
DRAIN CHANNEL 28F RND 3/8 FF (WOUND CARE) ×6 IMPLANT
DRAPE BILATERAL SPLIT (DRAPES) ×3 IMPLANT
DRAPE C-ARM 42X72 X-RAY (DRAPES) ×3 IMPLANT
DRAPE CV SPLIT W-CLR ANES SCRN (DRAPES) ×3 IMPLANT
DRAPE INCISE IOBAN 66X45 STRL (DRAPES) ×9 IMPLANT
DRAPE SLUSH/WARMER DISC (DRAPES) ×3 IMPLANT
DRSG COVADERM 4X8 (GAUZE/BANDAGES/DRESSINGS) ×3 IMPLANT
ELECT BLADE 6.5 EXT (BLADE) ×3 IMPLANT
ELECT REM PT RETURN 9FT ADLT (ELECTROSURGICAL) ×6
ELECTRODE REM PT RTRN 9FT ADLT (ELECTROSURGICAL) ×4 IMPLANT
FEMORAL VENOUS CANN RAP (CANNULA) IMPLANT
GLOVE BIO SURGEON STRL SZ 6 (GLOVE) ×3 IMPLANT
GLOVE BIO SURGEON STRL SZ 6.5 (GLOVE) ×18 IMPLANT
GLOVE BIOGEL PI IND STRL 6.5 (GLOVE) ×2 IMPLANT
GLOVE BIOGEL PI INDICATOR 6.5 (GLOVE) ×1
GLOVE ORTHO TXT STRL SZ7.5 (GLOVE) ×9 IMPLANT
GOWN STRL REUS W/ TWL LRG LVL3 (GOWN DISPOSABLE) ×8 IMPLANT
GOWN STRL REUS W/TWL LRG LVL3 (GOWN DISPOSABLE) ×8
KIT BASIN OR (CUSTOM PROCEDURE TRAY) ×3 IMPLANT
KIT DEVICE SUT COR-KNOT MIS 5 (INSTRUMENTS) ×3 IMPLANT
KIT DILATOR VASC 18G NDL (KITS) ×3 IMPLANT
KIT ROOM TURNOVER OR (KITS) ×3 IMPLANT
KIT SUCTION CATH 14FR (SUCTIONS) ×3 IMPLANT
LEAD PACING MYOCARDI (MISCELLANEOUS) ×3 IMPLANT
NEEDLE AORTIC ROOT 14G 7F (CATHETERS) ×3 IMPLANT
NS IRRIG 1000ML POUR BTL (IV SOLUTION) ×15 IMPLANT
PACK OPEN HEART (CUSTOM PROCEDURE TRAY) ×3 IMPLANT
PAD ARMBOARD 7.5X6 YLW CONV (MISCELLANEOUS) ×6 IMPLANT
PAD ELECT DEFIB RADIOL ZOLL (MISCELLANEOUS) ×3 IMPLANT
PATCH CORMATRIX 4CMX7CM (Prosthesis & Implant Heart) ×3 IMPLANT
RETRACTOR TRL SOFT TISSUE LG (INSTRUMENTS) IMPLANT
RETRACTOR TRM SOFT TISSUE 7.5 (INSTRUMENTS) IMPLANT
RING RECHORD MEMO 3D 32MM (Vascular Products) ×3 IMPLANT
SET CANNULATION TOURNIQUET (MISCELLANEOUS) ×3 IMPLANT
SET IRRIG TUBING LAPAROSCOPIC (IRRIGATION / IRRIGATOR) ×3 IMPLANT
SOLUTION ANTI FOG 6CC (MISCELLANEOUS) ×3 IMPLANT
SPONGE GAUZE 4X4 12PLY STER LF (GAUZE/BANDAGES/DRESSINGS) ×3 IMPLANT
SUT BONE WAX W31G (SUTURE) ×3 IMPLANT
SUT E-PACK MINIMALLY INVASIVE (SUTURE) ×3 IMPLANT
SUT ETHIBOND (SUTURE) ×6 IMPLANT
SUT ETHIBOND 2 0 SH (SUTURE) ×3 IMPLANT
SUT ETHIBOND 2-0 RB-1 WHT (SUTURE) ×6 IMPLANT
SUT ETHIBOND X763 2 0 SH 1 (SUTURE) ×3 IMPLANT
SUT GORETEX CV 4 TH 22 36 (SUTURE) ×3 IMPLANT
SUT GORETEX CV-5THC-13 36IN (SUTURE) ×24 IMPLANT
SUT GORETEX CV4 TH-18 (SUTURE) ×6 IMPLANT
SUT PROLENE 3 0 SH1 36 (SUTURE) ×12 IMPLANT
SUT PROLENE 6 0 C 1 30 (SUTURE) ×3 IMPLANT
SUT PTFE CHORD X SYSTEM (SUTURE) ×3 IMPLANT
SUT SILK 2 0 SH CR/8 (SUTURE) IMPLANT
SUT SILK 3 0 SH CR/8 (SUTURE) IMPLANT
SUT VIC AB 2-0 CTX 36 (SUTURE) IMPLANT
SUT VIC AB 3-0 SH 8-18 (SUTURE) IMPLANT
SUT VICRYL 2 TP 1 (SUTURE) IMPLANT
SYRINGE 10CC LL (SYRINGE) ×3 IMPLANT
SYSTEM SAHARA CHEST DRAIN ATS (WOUND CARE) ×3 IMPLANT
TAPE CLOTH SURG 4X10 WHT LF (GAUZE/BANDAGES/DRESSINGS) ×3 IMPLANT
TOWEL OR 17X24 6PK STRL BLUE (TOWEL DISPOSABLE) ×6 IMPLANT
TOWEL OR 17X26 10 PK STRL BLUE (TOWEL DISPOSABLE) ×6 IMPLANT
TRAY FOLEY IC TEMP SENS 16FR (CATHETERS) ×3 IMPLANT
TROCAR XCEL BLADELESS 5X75MML (TROCAR) ×3 IMPLANT
TROCAR XCEL NON-BLD 11X100MML (ENDOMECHANICALS) ×6 IMPLANT
TUBE SUCT INTRACARD DLP 20F (MISCELLANEOUS) ×3 IMPLANT
TUBING ART PRESS 12 MALE/MALE (MISCELLANEOUS) ×6 IMPLANT
TUNNELER SHEATH ON-Q 11GX8 DSP (PAIN MANAGEMENT) IMPLANT
UNDERPAD 30X30 INCONTINENT (UNDERPADS AND DIAPERS) ×3 IMPLANT
WATER STERILE IRR 1000ML POUR (IV SOLUTION) ×6 IMPLANT
WIRE BENTSON .035X145CM (WIRE) ×3 IMPLANT

## 2014-09-18 NOTE — Progress Notes (Signed)
  Echocardiogram Echocardiogram Transesophageal has been performed.  Darlina Sicilian M 09/18/2014, 12:07 PM

## 2014-09-18 NOTE — Anesthesia Preprocedure Evaluation (Addendum)
Anesthesia Evaluation  Patient identified by MRN, date of birth, ID band Patient awake    Reviewed: Allergy & Precautions, NPO status , Patient's Chart, lab work & pertinent test results  Airway Mallampati: II  TM Distance: >3 FB Neck ROM: Full    Dental no notable dental hx. (+) Teeth Intact, Chipped   Pulmonary shortness of breath, asthma , sleep apnea ,  breath sounds clear to auscultation  Pulmonary exam normal       Cardiovascular Normal cardiovascular exam+ Valvular Problems/Murmurs MR Rhythm:Regular Rate:Normal  Echo 05/2014 - Left ventricle: The cavity size was normal. Wall thickness wasnormal. Systolic function was normal. The estimated ejectionfraction was in the range of 60% to 65%. - Aortic valve: No evidence of vegetation. - Mitral valve: The prolapsing portion of the posterior leaflet creates two seperate jets.This is best seen in image 23 with TEEprobe at 110 degree angle. There is a very eccentric anterior jet that runs along the atrial wall and is difficult to quantify. There is a more centrally directed jet with a vena contracta of0.4 cm.There was severe regurgitation. - Left atrium: The atrium was dilated. No evidence of thrombus inthe appendage. - Right ventricle: The cavity size was mildly dilated. - Right atrium: No evidence of thrombus in the atrial cavity orappendage. - Atrial septum: No defect or patent foramen ovale was identified.Echo contrast study showed no right-to-left atrial level shunt,following an increase in RA pressure induced by provocativemaneuvers. - Tricuspid valve: There was moderate regurgitation. The TR venacontracta is 0.4 cm.  Impressions:  - Severe eccentric MR. Eccentricity limits quantification, howeverregurgitation appears to be severe.     Neuro/Psych negative neurological ROS  negative psych ROS   GI/Hepatic negative GI ROS, Neg liver ROS,   Endo/Other   negative endocrine ROS  Renal/GU negative Renal ROS     Musculoskeletal negative musculoskeletal ROS (+)   Abdominal   Peds  Hematology negative hematology ROS (+)   Anesthesia Other Findings   Reproductive/Obstetrics negative OB ROS                          Anesthesia Physical Anesthesia Plan  ASA: III  Anesthesia Plan: General   Post-op Pain Management:    Induction: Intravenous  Airway Management Planned: Double Lumen EBT  Additional Equipment: Arterial line, CVP, PA Cath, TEE and Ultrasound Guidance Line Placement  Intra-op Plan:   Post-operative Plan: Post-operative intubation/ventilation  Informed Consent: I have reviewed the patients History and Physical, chart, labs and discussed the procedure including the risks, benefits and alternatives for the proposed anesthesia with the patient or authorized representative who has indicated his/her understanding and acceptance.   Dental advisory given  Plan Discussed with: CRNA  Anesthesia Plan Comments:         Anesthesia Quick Evaluation

## 2014-09-18 NOTE — Procedures (Signed)
Inititated rapid wean protocol. Pt is stable at this time, RT will continue to monitor.

## 2014-09-18 NOTE — Op Note (Signed)
CARDIOTHORACIC SURGERY OPERATIVE NOTE  Date of Procedure:  09/18/2014  Preoperative Diagnosis: Severe Mitral Regurgitation  Postoperative Diagnosis: Same  Procedure:    Minimally-Invasive Mitral Valve Repair  Complex valvuloplasty including triangular resection of posterior leaflet  Artificial Gore-tex neochord placement x4  Sorin Memo 3D Rechord Ring Annuloplasty (size 32 mm, catalog # [id number], serial # [id number])    Surgeon: Valentina Gu. 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 He was referred to Dr. Harl Bowie for cardiology consultation, and transthoracic and transesophageal echocardiograms revealed the presence of mitral valve prolapse with a large flail segment of the posterior leaflet and severe mitral regurgitation. The patient was referred for elective surgical consultation.  The patient has been seen in consultation and counseled at length regarding the indications, risks and potential benefits of surgery.  All questions have been answered, and the patient provides full informed consent for the operation as described.    DETAILS OF THE OPERATIVE PROCEDURE  Preparation:  The patient is brought to the operating room on the above mentioned date and central monitoring was established by the anesthesia team including placement of Swan-Ganz catheter through the left internal jugular vein.  A radial arterial line is  placed. The patient is placed in the supine position on the operating table.  Intravenous antibiotics are administered. General endotracheal anesthesia is induced uneventfully. The patient is initially intubated using a dual lumen endotracheal tube.  A Foley catheter is placed.  Baseline transesophageal echocardiogram was performed.  Findings were notable for fibroelastic deficiency type degenerative disease of the mitral valve with an obvious flail segment of the middle scallop (P2) of the posterior leaflet. There was severe mitral regurgitation with both a central and an eccentric jet. There was left atrial enlargement. There was normal left ventricular systolic function here there was mild tricuspid regurgitation.  A soft roll is placed behind the patient's left scapula and the neck gently extended and turned to the left.   The patient's right neck, chest, abdomen, both groins, and both lower extremities are prepared and draped in a sterile manner. A time out procedure is performed.  Surgical Approach:  A right miniature anterolateral thoracotomy incision is performed. The incision is placed just lateral to and superior to the right nipple. The pectoralis major muscle is retracted medially and completely preserved. The right pleural space is entered through the 3rd intercostal space. A soft tissue retractor is placed.  Two 11 mm ports are placed through separate stab incisions inferiorly. The right pleural space is insufflated continuously with carbon dioxide gas through the posterior port during the remainder of the operation.  A pledgeted sutures placed through the dome of the right hemidiaphragm and retracted inferiorly to facilitate exposure.  A longitudinal incision is made in the pericardium 3 cm anterior to the phrenic nerve and silk traction sutures are placed on either side of the incision for exposure.   Extracorporeal Cardiopulmonary Bypass  and Myocardial Protection:  A small incision is  made in the right inguinal crease and the anterior surface of the right common femoral artery and right common femoral vein are identified.  The patient is placed in Trendelenburg position. The right internal jugular vein is cannulated with Seldinger technique and a guidewire advanced into the right atrium. The patient is heparinized systemically. The right internal jugular vein is cannulated with a 14 [place] pediatric femoral venous cannula. Pursestring sutures are placed on the anterior surface of the right common femoral vein and right common femoral artery. The right common femoral vein is cannulated with the Seldinger technique and a guidewire is advanced under transesophageal echocardiogram guidance through the right atrium. The femoral vein is cannulated with a long 22 French femoral venous cannula. The right common femoral artery is cannulated with Seldinger technique and a flexible guidewire is advanced until it can be appreciated intraluminally in the descending thoracic aorta on transesophageal echocardiogram. The femoral artery is cannulated with an 18 French femoral arterial cannula.  Adequate heparinization is verified.     The entire pre-bypass portion of the operation was notable for stable hemodynamics.  Cardiopulmonary bypass was begun.  Vacuum assist venous drainage is utilized. The incision in the pericardium is extended in both directions. Venous drainage and exposure are notably excellent. A retrograde cardioplegia cannula is placed through the right atrium into the coronary sinus using transesophageal echocardiogram guidance.  An antegrade cardioplegia cannula is placed in the ascending aorta.    The patient is cooled to 28C systemic temperature.  The aortic cross clamp is applied and cold blood cardioplegia is delivered initially in an antegrade fashion through the aortic root.   Supplemental cardioplegia is given retrograde through the coronary sinus catheter. The initial cardioplegic  arrest is rapid with early diastolic arrest.  Repeat doses of cardioplegia are administered intermittently every 20 to 30 minutes throughout the entire cross clamp portion of the operation through the aortic root and through the coronary sinus catheter in order to maintain completely flat electrocardiogram.  Myocardial protection was felt to be excellent.   Mitral Valve Repair:  A left atriotomy incision was performed through the interatrial groove and extended partially across the back wall of the left atrium after opening the oblique sinus inferiorly.  The mitral valve is exposed using a self-retaining retractor.  The mitral valve was inspected and notable for fibroelastic deficiency type degenerative disease. There were multiple ruptured primary chordae tendineae to the middle scallop (P2) of the posterior leaflet. There was mild billowing and redundancy of P1 and P3 but overall there was not significant excessive leaflet tissue. There was mild enlargement of the mitral annulus. .  Interrupted 2-0 Ethibond horizontal mattress sutures are placed circumferentially around the entire mitral valve annulus. The sutures will ultimately be utilized for ring annuloplasty, and at this juncture there are utilized to suspend the valve symmetrically.  A single Chord-X multistrand Gore-Tex suture is utilized for neo-chord replacement. The pledgeted papillary muscle sutures placed through the head of the anterior papillary muscle and tied in a horizontal mattress fashion. The remaining 6 strands of Gore-Tex suture are tagged for later use.  The flail middle scallop of the posterior leaflet is repaired using a simple triangular resection. Approximately 35% of the total surface area of P2 is resected. The intervening vertical defect in P2 was closed using simple interrupted everting CV-5 Gore-Tex sutures.  The valve was tested with saline and appeared competent even without ring annuloplasty complete. The valve  was  sized to a 32 mm annuloplasty ring, based upon the transverse distance between the left and right commissures and the height of the anterior leaflet, corresponding to a size just slightly larger than the overall surface area of the anterior leaflet.  A Sorin Memo 3D Rechord annuloplasty ring (size 32 mm, catalog [id number], serial [id number]) was lowered place uneventfully.   The individual limbs of the Goretex neochords were retrieved from the LV chamber.  1 pair of Gore-Tex neochords was placed on either side of the repaired vertical defect in P2 to buttress the suture line.  Each was tied using the yellow loop of the Rechord annuloplasty ring to adjust the height of the sutures to the posterior mitral annulus. After both were tied the blue and yellow strings from the annuloplasty ring were removed to release the posterior leaflet.  All ring sutures were secured using a Cor-knot device.    The valve is tested with saline and appears to be perfectly competent with a broad symmetrical line of coaptation of the anterior and posterior leaflet. There is no residual leak. There was a broad, symmetrical line of coaptation of the anterior and posterior leaflet which was confirmed using the blue ink test.  Rewarming is begun.   Procedure Completion:  The atriotomy was closed using a 2-layer closure of running 3-0 Prolene suture after placing a sump drain across the mitral valve to serve as a left ventricular vent.  One final dose of warm retrograde "hot shot" cardioplegia was administered retrograde through the coronary sinus catheter while all air was evacuated through the aortic root.  The aortic cross clamp was removed after a total cross clamp time of 138 minutes.  Epicardial pacing wires are fixed to the inferior wall of the right ventricule and to the right atrial appendage. The patient is rewarmed to 37C temperature. The left ventricular vent is removed.  The patient is ventilated and flow volumes turndown  while the mitral valve repair is inspected using transesophageal echocardiogram. The valve repair appears intact with no residual leak. The antegrade cardioplegia cannula is now removed. The patient is weaned and disconnected from cardiopulmonary bypass.  The patient's rhythm at separation from bypass was AV paced.  The patient was weaned from bypass without any inotropic support. Total cardiopulmonary bypass time for the operation was 183 minutes.  Followup transesophageal echocardiogram performed after separation from bypass revealed a well-seated annuloplasty ring in the mitral position with a normal functioning mitral valve. There was no residual leak.  Left ventricular function was unchanged from preoperatively.    The femoral arterial and venous cannulae were removed uneventfully. There was a palpable pulse in the distal right common femoral artery after removal of the cannula. Protamine was administered to reverse the anticoagulation. The right internal jugular cannula was removed and manual pressure held on the neck for 15 minutes.  Single lung ventilation was begun. The atriotomy closure was inspected for hemostasis. The pericardial sac was drained using a 28 French Bard drain placed through the anterior port incision.  The pericardium was closed using a patch of core matrix bovine submucosal tissue patch. The right pleural space is irrigated with saline solution and inspected for hemostasis. The right pleural space was drained using a 28 French Bard drain placed through the posterior port incision. The miniature thoracotomy incision was closed in multiple layers in routine fashion. The right groin incision was inspected for hemostasis and closed in multiple layers in routine fashion.  The post-bypass portion of  the operation was notable for stable rhythm and hemodynamics.  The patient resumed normal sinus rhythm spontaneously.  No blood products were administered during the  operation.   Disposition:  The patient tolerated the procedure well.  The patient was reintubated using a single lumen endotracheal tube and subsequently transported to the surgical intensive care unit in stable condition. There were no intraoperative complications. All sponge instrument and needle counts are verified correct at completion of the operation.     Valentina Gu. Roxy Manns MD 09/18/2014 5:06 PM

## 2014-09-18 NOTE — Brief Op Note (Addendum)
09/18/2014      [address]       [place] [address]             [phone]     09/18/2014  2:21 PM  PATIENT:  Jonathan Church  69 y.o. 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  MV Stenosis: No mitral valve stenosis.  MV Disease Functional Class: MV Disease Functional Class: Type II.  Etiology (Choose at least one and up to five): Degenerative.  MV Lesions (Choose at least one): Leaflet prolapse, posterior.   Mitral/Tricuspid/Pulmonary Valve Procedure  Mitral Valve Procedure Performed:  Repair: Annuloplasty., Leaflet Resection. Resection Type:Triangular. Mitral Leaflet Resection Location: Posterior. and [place]. Number of Neochords Inserted: 4  Implant: Annuloplasty Device: Implant model number [id number], Size 32 mm, Unique Device Identifier [id number].  COMPLICATIONS: None  BASELINE WEIGHT: 82 kg  PATIENT DISPOSITION:   TO SICU IN STABLE CONDITION  [name] 09/18/2014 5:02 PM

## 2014-09-18 NOTE — Procedures (Signed)
VC 1.3 Liters , NIF -[id number]

## 2014-09-18 NOTE — Procedures (Signed)
Extubation Procedure Note  Patient Details:   Name: Jonathan Church DOB: 01/15/1946 MRN: [id number]   Airway Documentation: Pt extubated to 5 Liters [place] , able to speak his name. Pt has strong productive cough and is stable at this time. RT will continue to monitor.  Evaluation  O2 sats: stable throughout Complications: No apparent complications Patient did tolerate procedure well. Bilateral Breath Sounds: Clear, Diminished Suctioning: Airway Yes  Erla Bacchi D Bland Span 09/18/2014, 9:16 PM

## 2014-09-18 NOTE — OR Nursing (Signed)
RN contacted SICU with patient update.  Plans for transfer to 2S03.

## 2014-09-18 NOTE — H&P (Signed)
[address]       [place] [address]             [phone]          CARDIOTHORACIC SURGERY HISTORY AND PHYSICAL EXAM  Referring Provider is Branch, Alphonse Guild, MD PCP is London Pepper, MD  Chief Complaint  Patient presents with  . Mitral Regurgitation    eval for surgery...ECHO 05/16/14, TEE 06/11/14, CATH 06/18/14    HPI:  Patient is a 69 year old previously healthy male who was recently discovered to have a heart murmur on routine physical exam by his primary care physician. He was referred to Dr. Harl Bowie for cardiology consultation, and transthoracic and transesophageal echocardiograms revealed the presence of mitral valve prolapse with a large flail segment of the posterior leaflet and severe mitral regurgitation. The patient was referred for elective surgical consultation. He was originally seen in consultation on 06/28/2014 and he was seen more recently on 08/12/2014. He reports no new problems or complaints over the past month.  The patient is married and lives locally between his [place] and [place]. He is a self-employed Development worker, community and has remained physically active and healthy all of his life. He does report a very slow gradual onset of mild exertional shortness of breath and fatigue over the last few years. He states that he only gets short of breath with very strenuous activity, but he has noted a slight decrease in his exercise tolerance. He denies any shortness of breath with ordinary activity. He has never had any chest discomfort or shortness of breath at rest. He has never had any symptoms of PND, orthopnea, or lower extremity edema.            Past Medical History  Diagnosis Date  . Sleep apnea     wears CPAP  . Heart murmur   . Head injury, closed   . Shortness of breath dyspnea     with exertion  . Asthma     years ago  . Hemorrhoids   . Severe mitral regurgitation 05/16/2014    Past Surgical History  Procedure  Laterality Date  . Tee without cardioversion N/A 06/11/2014    Procedure: TRANSESOPHAGEAL ECHOCARDIOGRAM (TEE);  Surgeon: Arnoldo Lenis, MD;  Location: [facility];  Service: Cardiology;  Laterality: N/A;  . Cardiac catheterization N/A 06/18/2014    Procedure: Right/Left Heart Cath and Coronary Angiography;  Surgeon: Jettie Booze, MD;  Location: [facility];  Service: Cardiovascular;  Laterality: N/A;  . Nasal septrum  1970's  . Colonoscopy      No family history on file.  Social History Social History  Substance Use Topics  . Smoking status: Never Smoker   . Smokeless tobacco: Never Used  . Alcohol Use: No    Prior to Admission medications   Medication Sig Start Date End Date Taking? Authorizing Provider  amiodarone (PACERONE) 200 MG tablet Take 1 tablet (200 mg total) by mouth 2 (two) times daily. Patient taking differently: Take 200 mg by mouth See admin instructions. Take 1 tablet (200 mg) by mouth twice daily for one week before the surgery starting on 09/11/14; do not take on the day of the surgery; then take1 tablet (200 mg)  twice daily for one week after surgery 08/12/14  Yes [name], MD  OVER THE COUNTER MEDICATION Take 80,000 Int'l Units by mouth daily. Serapeptase 80,000 IU daily    Historical Provider, MD    Allergies  Allergen Reactions  . Tape Rash    Rash from tape wrapped around arm after giving blood      Review of Systems:  General:normal appetite, decreased energy, no weight gain, no weight loss, no fever Cardiac:no chest pain with exertion, no chest pain at rest, + SOB with strenuous exertion, no resting SOB, no PND, no orthopnea, no palpitations, no arrhythmia, no atrial fibrillation, no LE edema, no dizzy spells, no syncope Respiratory:no shortness of breath, no home oxygen, no productive cough, no dry cough, no bronchitis, no wheezing,  no hemoptysis, no asthma, no pain with inspiration or cough, + sleep apnea, + CPAP at night GI:no difficulty swallowing, no reflux, no frequent heartburn, no hiatal hernia, no abdominal pain, no constipation, no diarrhea, no hematochezia, no hematemesis, no melena, + occasional hemorrhoids GU:no dysuria, no frequency, no urinary tract infection, no hematuria, no enlarged prostate, no kidney stones, no kidney disease, mild erectile dysfunction Vascular:no pain suggestive of claudication, no pain in feet, no leg cramps, no varicose veins, no DVT, no non-healing foot ulcer Neuro:no stroke, no TIA's, no seizures, no headaches, no temporary blindness one eye, no slurred speech, no peripheral neuropathy, no chronic pain, no instability of gait, no memory/cognitive dysfunction Musculoskeletal:no arthritis, n joint swelling, no myalgias, no difficulty walking, normal mobility  Skin:no rash, no itching, no skin infections, no pressure sores or ulcerations Psych:no anxiety, no depression, no nervousness, no unusual recent stress Eyes:no blurry vision, no floaters, no recent vision changes, + wears glasses for reading ENT:no hearing loss, no loose or painful teeth, no dentures, last saw dentist within the past year Hematologic:no easy bruising, no abnormal bleeding, no clotting disorder, no frequent epistaxis Endocrine:no diabetes, does not check CBG's at home   Physical Exam:  BP 149/88 mmHg  Pulse 73  Resp 16  Ht 5' 7.5" (1.715 m)  Wt 178 lb (80.74 kg)  BMI 27.45 kg/m2  SpO2  98% General:Healthy, well-appearing HEENT:Unremarkable  Neck:no JVD, no bruits, no adenopathy  Chest:clear to auscultation, symmetrical breath sounds, no wheezes, no rhonchi  CV:RRR, grade IV/VI holosystolic murmur  Abdomen:soft, non-tender, no masses  Extremities:warm, well-perfused, pulses palpable, no LE edema Rectal/GUDeferred Neuro:Grossly non-focal and symmetrical throughout Skin:Clean and dry, no rashes, no breakdown   Diagnostic Tests:  Transthoracic Echocardiography  Patient:  Jonathan Church, Jonathan Church MR #:    [id number] Study Date: 05/16/2014 Gender:   M Age:    42 Height:   170.2 cm Weight:   81.2 kg BSA:    1.98 m^2 Pt. Status: Room:  ATTENDING  Kerry Hough, M.D. Berna Spare, M.D. REFERRING  Kerry Hough, M.D. PERFORMING  [facility], [facility] SONOGRAPHER Titus Mould, RCS  cc:  ------------------------------------------------------------------- LV EF: 60% -  65%  ------------------------------------------------------------------- Indications:   Murmur 785.2.  ------------------------------------------------------------------- Study Conclusions  - Left ventricle: The cavity size was normal. Systolic function was normal. The estimated ejection fraction was in the range of 60% to 65%. Wall motion was normal; there were no regional wall motion abnormalities. 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 Suggest TEE and consideration for repair if clinically indicated. - Left atrium: The atrium was mildly  dilated. - Atrial septum: No defect or patent foramen ovale was identified.  Transthoracic echocardiography. M-mode, complete 2D, spectral Doppler, and color Doppler. Birthdate: Patient birthdate: August 04, 1945. Age: Patient is 69 yr old. Sex: Gender: male. BMI: 28 kg/m^2. Blood pressure:   148/80 Patient status: Inpatient. Study date: Study date: 05/16/2014. Study time: 03:23  PM. Location: Echo laboratory.  -------------------------------------------------------------------  ------------------------------------------------------------------- Left ventricle: The cavity size was normal. Systolic function was normal. The estimated ejection fraction was in the range of 60% to 65%. Wall motion was normal; there were no regional wall motion abnormalities.  ------------------------------------------------------------------- Aortic valve:  Trileaflet; normal thickness leaflets. Mobility was not restricted. Doppler: Transvalvular velocity was within the normal range. There was no stenosis. There was no regurgitation.  ------------------------------------------------------------------- Aorta: Aortic root: The aortic root was normal in size.  ------------------------------------------------------------------- Mitral valve: Bileaflet prolapse with severe MR and myxomatous valve leaflets. Suggest TEE and consideration for repair if clinically indicated. Mobility was not restricted. Doppler: Transvalvular velocity was within the normal range. There was no evidence for stenosis.  Peak gradient (D): 9 mm Hg.  ------------------------------------------------------------------- Left atrium: The atrium was mildly dilated.  ------------------------------------------------------------------- Atrial septum: No defect or patent foramen ovale was identified.  ------------------------------------------------------------------- Right ventricle: The cavity size was normal.  Wall thickness was normal. Systolic function was normal.  ------------------------------------------------------------------- Pulmonic valve:  Doppler: Transvalvular velocity was within the normal range. There was no evidence for stenosis.  ------------------------------------------------------------------- Tricuspid valve:  Structurally normal valve.  Doppler: Transvalvular velocity was within the normal range. There was mild regurgitation.  ------------------------------------------------------------------- Pulmonary artery:  The main pulmonary artery was normal-sized. Systolic pressure was within the normal range.  ------------------------------------------------------------------- Right atrium: The atrium was normal in size.  ------------------------------------------------------------------- Pericardium: The pericardium was normal in appearance. There was no pericardial effusion.  ------------------------------------------------------------------- Systemic veins: Inferior vena cava: The vessel was normal in size.  ------------------------------------------------------------------- Measurements  Left ventricle             Value    Reference LV ID, ED, PLAX chordal    (H)   57.7 mm   43 - 52 LV ID, ES, PLAX chordal        32.9 mm   23 - 38 LV fx shortening, PLAX chordal     43  %   >=29 LV PW thickness, ED          9.51 mm   --------- IVS/LV PW ratio, ED          1.09     <=1.3 LV e&', lateral             13.7 cm/s  --------- LV E/e&', lateral            10.66    --------- LV e&', medial             8.59 cm/s  --------- LV E/e&', medial            17      --------- LV e&', average             11.15 cm/s  --------- LV E/e&', average            13.1     ---------  Ventricular septum            Value    Reference IVS thickness, ED           10.4 mm   ---------  LVOT                  Value    Reference LVOT ID, S               21  mm   --------- LVOT area               3.46 cm^2  ---------  Aorta  Value    Reference Aortic root ID, ED           39  mm   ---------  Left atrium              Value    Reference LA ID, A-P, ES             42  mm   --------- LA ID/bsa, A-P             2.12 cm/m^2 <=2.2 LA volume, S              101  ml   --------- LA volume/bsa, S            51.1 ml/m^2 --------- LA volume, ES, 1-p A4C         91.4 ml   --------- LA volume/bsa, ES, 1-p A4C       46.2 ml/m^2 --------- LA volume, ES, 1-p A2C         112  ml   --------- LA volume/bsa, ES, 1-p A2C       56.6 ml/m^2 ---------  Mitral valve              Value    Reference Mitral E-wave peak velocity      146  cm/s  --------- Mitral A-wave peak velocity      62.8 cm/s  --------- Mitral deceleration time        230  ms   150 - 230 Mitral peak gradient, D        9   mm Hg --------- Mitral E/A ratio, peak         2.4     --------- Mitral regurg VTI, PISA        123  cm   ---------  Systemic veins             Value    Reference Estimated CVP             3   mm Hg ---------  Right ventricle            Value    Reference RV ID, ED, PLAX            36.8 mm   19 - 38  Pulmonic valve             Value    Reference Pulmonic regurg velocity, ED      95  cm/s  ---------  Legend: (L) and (H) mark values outside specified reference  range.  ------------------------------------------------------------------- Prepared and Electronically Authenticated by  Jenkins Rouge, M.D. 2016-04-21T17:16:33   Transesophageal Echocardiography  Patient:  Jonathan Church, Jonathan Church MR #:    [id number] Study Date: 06/11/2014 Gender:   M Age:    89 Height:   170.2 cm Weight:   84.5 kg BSA:    2.02 m^2 Pt. Status: Room:    AP[facility]  ADMITTING  Kerry Hough, M.D. ATTENDING  Kerry Hough, M.D. Berna Spare, M.D. REFERRING  Kerry Hough, M.D. PERFORMING  [facility], [facility] SONOGRAPHER Titus Mould, RCS  cc:  ------------------------------------------------------------------- LV EF: 60% -  65%  ------------------------------------------------------------------- Indications:   Mitral regurgitation 424.0.  ------------------------------------------------------------------- Study Conclusions  - Left ventricle: The cavity size was normal. Wall thickness was normal. Systolic function was normal. The estimated ejection fraction was in the range of 60% to 65%. - Aortic valve: No evidence of vegetation. - Mitral valve: The prolapsing portion of the posterior leaflet creates two seperate  jets.This is best seen in image 23 with TEE probe at 110 degree angle. There is a very eccentric anterior jet that runs along the atrial wall and is difficult to quantify. There is a more centrally directed jet with a vena contracta of 0.4 cm. There was severe regurgitation. - Left atrium: The atrium was dilated. No evidence of thrombus in the appendage. - Right ventricle: The cavity size was mildly dilated. - Right atrium: No evidence of thrombus in the atrial cavity or appendage. - Atrial septum: No defect or patent foramen ovale was identified. Echo contrast study showed no right-to-left atrial level shunt, following an increase in RA pressure induced by  provocative maneuvers. - Tricuspid valve: There was moderate regurgitation. The TR vena contracta is 0.4 cm.  Impressions:  - Severe eccentric MR. Eccentricity limits quantification, however regurgitation appears to be severe.  Diagnostic transesophageal echocardiography. 2D and color Doppler. Birthdate: Patient birthdate: [birth year]. Age: Patient is 69 yr old. Sex: Gender: male.  BMI: 29.2 kg/m^2. Blood pressure: 135/82 Study date: Study date: 06/11/2014. Study time: 09:00 AM.  -------------------------------------------------------------------  ------------------------------------------------------------------- Left ventricle: The cavity size was normal. Wall thickness was normal. Systolic function was normal. The estimated ejection fraction was in the range of 60% to 65%.  ------------------------------------------------------------------- Aortic valve:  Structurally normal valve.  Cusp separation was normal. No evidence of vegetation. Doppler:  There was no stenosis.  There was no regurgitation.  ------------------------------------------------------------------- Aorta: The visualized portions of the descending thoracic aorta were normal in size without significant plaque. Aortic root: The aortic root was normal in size.  ------------------------------------------------------------------- Mitral valve:  Normal thickness leaflets . There is prolapse of a portion of the posterior leaflet, appears to be partial prolapse of the P2 segment of the posterior leaflet. The prolapsing portion of the posterior leaflet creates two seperate jets.This is best seen in image 23 with TEE probe at 110 degree angle. There is a very eccentric anterior jet that runs along the atrial wall and is difficult to quantify. There is a more centrally directed jet with a vena contracta of 0.4 cm. Eccentricity of jets prohbitis PISA measurements. Doppler:  There was no  evidence for stenosis.  There was severe regurgitation.  ------------------------------------------------------------------- Left atrium: The atrium was dilated. No evidence of thrombus in the appendage. The appendage was of normal size. Emptying velocity was normal of 100 cm/s.  ------------------------------------------------------------------- Atrial septum: No defect or patent foramen ovale was identified. Echo contrast study showed no right-to-left atrial level shunt, following an increase in RA pressure induced by provocative maneuvers.  ------------------------------------------------------------------- Pulmonary veins: There is systolic blunting of pulmonary vein systolic flow consistent with elevated LA pressure.  ------------------------------------------------------------------- Right ventricle: The cavity size was mildly dilated. Systolic function was normal.  ------------------------------------------------------------------- Pulmonic valve:  Not well visualized. Doppler:  There was no evidence for stenosis.  There was mild regurgitation.  ------------------------------------------------------------------- Tricuspid valve:  Normal thickness leaflets. Doppler:  There was no evidence for stenosis.  There was moderate regurgitation. The TR vena contracta is 0.4 cm.  ------------------------------------------------------------------- Right atrium: The atrium was normal in size. No evidence of thrombus in the atrial cavity or appendage.  ------------------------------------------------------------------- Pericardium: There was no pericardial effusion.  ------------------------------------------------------------------- Post procedure conclusions Ascending Aorta:  - The visualized portions of the descending thoracic aorta were normal in size without significant  plaque.  ------------------------------------------------------------------- Measurements  Aorta       Value Aortic root ID   35  mm  Legend: (L) and (H) mark values outside specified  reference range.  ------------------------------------------------------------------- Prepared and Electronically Authenticated by  Kerry Hough, M.D. 2016-05-17T10:48:04    CARDIAC CATHETERIZATION Conclusion     No significant coronary artery disease.  Mild pulmonary artery hypertension.  Prominent V waves noted on wedge tracing. Mildly elevated LVEDP.  Pulmonary artery saturation 65%. Aortic saturation 96%. Cardiac output 4.2 L/m. Cardiac index 2.1.     CT ANGIOGRAPHY CHEST, ABDOMEN AND PELVIS  TECHNIQUE: Multidetector CT imaging through the chest, abdomen and pelvis was performed using the standard protocol during bolus administration of intravenous contrast. Multiplanar reconstructed images and MIPs were obtained and reviewed to evaluate the vascular anatomy.  CONTRAST: 25mL OMNIPAQUE IOHEXOL 350 MG/ML SOLN  COMPARISON: None.  FINDINGS: CTA CHEST FINDINGS  VASCULAR  Heart/Vascular: Conventional 3 vessel aortic arch anatomy. Mildly tortuous thoracic aorta. No evidence of aneurysm, dissection or other acute aortic abnormality. The main and central pulmonary arteries are within normal limits for size. No central. Mild cardiomegaly. No pericardial effusion  Review of the MIP images confirms the above findings.  NON VASCULAR  Mediastinum: Unremarkable CT appearance of the thyroid gland. No suspicious mediastinal or hilar adenopathy. No soft tissue mediastinal mass. The thoracic esophagus is unremarkable.  Lungs/Pleura: Mild dependent atelectasis in the lower lobes. Otherwise, the lungs are clear.  Bones/Soft Tissues: No acute fracture or aggressive appearing lytic or blastic osseous lesion.  CTA ABDOMEN AND PELVIS  FINDINGS  VASCULAR  Aorta: Normal caliber aorta without evidence of atherosclerotic plaque, dissection or other acute abnormality.  Celiac: Variants arterial anatomy. The splenic artery arises directly from the abdominal aorta. The common hepatic artery is replaced the SMA.  SMA: Replaced common hepatic artery.  Renals: Single dominant renal arteries bilaterally which are widely patent. No changes of fibromuscular dysplasia.  IMA: Widely patent.  Inflow: Widely patent and unremarkable.  Proximal Outflow: Widely patent and unremarkable.  Veins: No focal venous abnormality within the limitations of this non venous phase timed study.  Review of the MIP images confirms the above findings.  NON-VASCULAR  Abdomen: Unremarkable CT scan of stomach, duodenum, spleen, adrenal glands and pancreas. No discrete hepatic lesion. Calcified gallstones within the gallbladder lumen. No secondary findings to suggest acute cholecystitis no intra or extrahepatic biliary ductal dilatation.  Unremarkable appearance of the bilateral kidneys. No focal solid lesion, hydronephrosis or nephrolithiasis. 2.9 cm simple cyst exophytic from the anterior interpolar right kidney. Gallbladder is unremarkable. No evidence of obstruction or focal bowel wall thickening. Normal appendix in the right lower quadrant. The terminal ileum is unremarkable. No free fluid or suspicious adenopathy.  Pelvis: Prostatomegaly with some indentation of the bladder. Otherwise, the bladder is unremarkable no free fluid or significant adenopathy.  Bones/Soft Tissues: No acute fracture or aggressive appearing lytic or blastic osseous lesion. T9 and T4 vertebral body hemangiomas. Multilevel degenerative disc disease most notable at L4-L5 and L5-S1.  IMPRESSION: VASCULAR  1. No evidence of acute vascular abnormality, aneurysm or significant atherosclerotic vascular plaque or stenosis. 2. 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 NON VASCULAR  1. No acute abnormality in the chest, abdomen or pelvis. 2. Mild cardiomegaly. 3. Cholelithiasis. 4. Simple right renal cyst. 5. Prostatomegaly with indentation of the bladder base. 6. Lower lumbar degenerative disc disease.  Signed,  Criselda Peaches, MD  [facility]  [facility]   Electronically Signed  By: Jacqulynn Cadet M.D.  On: 07/02/2014 17:37         Impression:  Patient has stage D severe symptomatic  primary mitral regurgitation. I have personally reviewed the patient's recent transthoracic and transesophageal echocardiograms. 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 Left ventricular size systolic function remain normal. Diagnostic cardiac catheterization is notable for the absence of significant coronary artery disease. There was mild elevation of left ventricular end-diastolic pressure and prominent V waves on pulmonary capillary wedge tracing. There was mild pulmonary hypertension. Echocardiogram demonstrates mild to moderate tricuspid regurgitation. I agree the patient needs elective mitral valve repair. Based upon review of the patient's transesophageal echocardiogram I feel there is a very high likelihood that his valve should be repairable with very low associated operative risks. The patient appears to be a very good candidate for minimally invasive approach for surgery.     Plan:  The patient and his wife were again counseled regarding the indications, risks and potential benefits of mitral valve repair. The rationale for elective surgery has been explained, including a comparison between surgery and continued medical therapy with close follow-up. The likelihood of successful and durable valve repair has been discussed with  particular reference to the findings of their recent echocardiogram. Based upon these findings and previous experience, I have quoted them a greater than 95 percent likelihood of successful valve repair. In the unlikely event that their valve cannot be successfully repaired, we discussed the possibility of replacing the mitral valve using a mechanical prosthesis with the attendant need for long-term anticoagulation versus the alternative of replacing it using a bioprosthetic tissue valve with its potential for late structural valve deterioration and failure, depending upon the patient's longevity. The patient understands and accepts all potential risks of surgery including but not limited to risk of death, stroke or other neurologic complication, myocardial infarction, congestive heart failure, respiratory failure, renal failure, bleeding requiring transfusion and/or reexploration, arrhythmia, infection or other wound complications, pneumonia, pleural and/or pericardial effusion, pulmonary embolus, aortic dissection or other major vascular complication, or delayed complications related to valve repair or replacement including but not limited to structural valve deterioration and failure, thrombosis, embolization, endocarditis, or paravalvular leak. Alternative surgical approaches have been discussed including a comparison between conventional sternotomy and minimally-invasive techniques. The relative risks and benefits of each have been reviewed as they pertain to the patient's specific circumstances, and all of their questions have been addressed. Specific risks potentially related to the minimally-invasive approach were discussed at length, including but not limited to risk of conversion to full or partial sternotomy, aortic dissection or other major vascular complication, unilateral acute lung injury or pulmonary edema, phrenic nerve dysfunction or paralysis, rib fracture, chronic pain, lung hernia, or  lymphocele. All of their questions have been answered. We plan to proceed with surgery on Wednesday, 09/18/2014.    Valentina Gu. Roxy Manns, MD 09/16/2014 6:39 PM

## 2014-09-18 NOTE — Transfer of Care (Signed)
Immediate Anesthesia Transfer of Care Note  Patient: Jonathan Church  Procedure(s) Performed: Procedure(s): MINIMALLY INVASIVE MITRAL VALVE REPAIR (MVR) (Right) TRANSESOPHAGEAL ECHOCARDIOGRAM (TEE) (N/A)  Patient Location: SICU  Anesthesia Type:General  Level of Consciousness: sedated and Patient remains intubated per anesthesia plan  Airway & Oxygen Therapy: Patient remains intubated per anesthesia plan and Patient placed on Ventilator (see vital sign flow sheet for setting)  Post-op Assessment: Report given to RN and Post -op Vital signs reviewed and stable  Post vital signs: Reviewed and stable  Last Vitals:  Filed Vitals:   09/18/14 1651  BP: 89/43  Pulse: 70  Resp: 15    Complications: No apparent anesthesia complications

## 2014-09-18 NOTE — Progress Notes (Signed)
Patient ID: Jonathan Church, male   DOB: Jan 03, 1946, 69 y.o.   MRN: [id number]  SICU Evening Rounds:   Hemodynamically stable  CI = 1.9 on no drips  sats 100%  Still asleep on vent  Urine output good  CT output low  CBC    Component Value Date/Time   WBC 15.0* 09/18/2014 1650   RBC 4.43 09/18/2014 1650   HGB 14.3 09/18/2014 1702   HCT 42.0 09/18/2014 1702   PLT 132* 09/18/2014 1650   MCV 93.5 09/18/2014 1650   MCH 32.3 09/18/2014 1650   MCHC 34.5 09/18/2014 1650   RDW 12.2 09/18/2014 1650     BMET    Component Value Date/Time   NA 141 09/18/2014 1702   K 4.1 09/18/2014 1702   CL 107 09/18/2014 1457   CO2 25 09/16/2014 1601   GLUCOSE 103* 09/18/2014 1702   BUN 14 09/18/2014 1457   CREATININE 1.30* 09/18/2014 1457   CALCIUM 9.1 09/16/2014 1601   GFRNONAA 44* 09/16/2014 1601   GFRAA 51* 09/16/2014 1601     A/P:  Stable postop course. Continue current plans

## 2014-09-18 NOTE — OR Nursing (Signed)
RN contacted SICU with patient update.

## 2014-09-18 NOTE — Anesthesia Postprocedure Evaluation (Signed)
Anesthesia Post Note  Patient: Jonathan Church  Procedure(s) Performed: Procedure(s) (LRB): MINIMALLY INVASIVE MITRAL VALVE REPAIR (MVR) (Right) TRANSESOPHAGEAL ECHOCARDIOGRAM (TEE) (N/A)  Anesthesia type: General  Patient location: PACU  Post pain: NA  Post assessment: Post-op Vital signs reviewed  Last Vitals: BP 112/73 mmHg  Pulse 65  Temp(Src) 36.1 C (Core (Comment))  Resp 12  Ht 5' 7.5" (1.715 m)  Wt 180 lb (81.647 kg)  BMI 27.76 kg/m2  SpO2 99%  Post vital signs: Reviewed  Level of consciousness: sedated/intubated  Complications: No apparent anesthesia complications

## 2014-09-18 NOTE — Interval H&P Note (Signed)
History and Physical Interval Note:  09/18/2014 7:31 AM  Jonathan Church  has presented today for surgery, with the diagnosis of Mitril Regurgatation  The various methods of treatment have been discussed with the patient and family. After consideration of risks, benefits and other options for treatment, the patient has consented to  Procedure(s): MINIMALLY INVASIVE MITRAL VALVE REPAIR (MVR) (Right) TRANSESOPHAGEAL ECHOCARDIOGRAM (TEE) (N/A) as a surgical intervention .  The patient's history has been reviewed, patient examined, no change in status, stable for surgery.  I have reviewed the patient's chart and labs.  Questions were answered to the patient's satisfaction.     [name]

## 2014-09-18 NOTE — Anesthesia Procedure Notes (Addendum)
Procedure Name: Intubation Date/Time: 09/18/2014 9:35 AM Performed by: Merdis Delay Pre-anesthesia Checklist: Patient identified, Emergency Drugs available, Timeout performed, Patient being monitored and Suction available Patient Re-evaluated:Patient Re-evaluated prior to inductionOxygen Delivery Method: Circle system utilized Preoxygenation: Pre-oxygenation with 100% oxygen Intubation Type: IV induction Ventilation: Mask ventilation without difficulty Laryngoscope Size: Mac and 4 Grade View: Grade II Tube type: Oral Endobronchial tube: Double lumen EBT and Left and 39 Fr Number of attempts: 2 Airway Equipment and Method: Stylet and Fiberoptic brochoscope Placement Confirmation: ETT inserted through vocal cords under direct vision,  breath sounds checked- equal and bilateral,  positive ETCO2 and CO2 detector Dental Injury: Teeth and Oropharynx as per pre-operative assessment  Comments: Recessed chin with prominent teeth. Anterior airway. DL x1- grade 2 view- esoph intubation- removed. DL x1 by Lissa Hoard.   Procedure Name: Intubation Date/Time: 09/18/2014 4:57 PM Performed by: Merdis Delay Pre-anesthesia Checklist: Patient identified, Suction available, Patient being monitored, Timeout performed and Emergency Drugs available Patient Re-evaluated:Patient Re-evaluated prior to inductionOxygen Delivery Method: Circle system utilized Preoxygenation: Pre-oxygenation with 100% oxygen Tube type: Subglottic suction tube Tube size: 8.0 mm Number of attempts: 1 Intubation method: cook exchange catheter. Placement Confirmation: positive ETCO2,  breath sounds checked- equal and bilateral and CO2 detector Secured at: 24 cm Tube secured with: Tape

## 2014-09-18 NOTE — Plan of Care (Signed)
Problem: Phase II - Intermediate Post-Op Goal: Wean to Extubate Outcome: Completed/Met Date Met:  09/18/14 Extubated 8/24 2105 Goal: Maintain Hemodynamic Stability Outcome: Completed/Met Date Met:  09/18/14 Heart rate per external pacemaker to maintain hemodynamics  Goal: CBGs/Blood Glucose per SCIP Criteria Outcome: Completed/Met Date Met:  09/18/14 cbg checks every 4 hours with SSI

## 2014-09-19 ENCOUNTER — Inpatient Hospital Stay (HOSPITAL_COMMUNITY): Payer: Medicare Other

## 2014-09-19 ENCOUNTER — Encounter (HOSPITAL_COMMUNITY): Payer: Self-pay | Admitting: Thoracic Surgery (Cardiothoracic Vascular Surgery)

## 2014-09-19 LAB — CREATININE, SERUM
CREATININE: 1.36 mg/dL — AB (ref 0.61–1.24)
GFR calc Af Amer: >60 mL/min (ref >60)
GFR, EST NON AFRICAN AMERICAN: 52 mL/min — AB (ref >60)

## 2014-09-19 LAB — GLUCOSE, CAPILLARY
GLUCOSE-CAPILLARY: 96 mg/dL (ref 65–99)
Glucose-Capillary: 101 mg/dL — ABNORMAL HIGH (ref 65–99)
Glucose-Capillary: 144 mg/dL — ABNORMAL HIGH (ref 65–99)
Glucose-Capillary: 115 mg/dL — ABNORMAL HIGH (ref 65–99)
Glucose-Capillary: 114 mg/dL — ABNORMAL HIGH (ref 65–99)
Glucose-Capillary: 101 mg/dL — ABNORMAL HIGH (ref 65–99)
Glucose-Capillary: 118 mg/dL — ABNORMAL HIGH (ref 65–99)
Glucose-Capillary: 125 mg/dL — ABNORMAL HIGH (ref 65–99)

## 2014-09-19 LAB — POCT I-STAT, CHEM 8
BUN: 16 mg/dL (ref 6–20)
Calcium, Ion: 1.13 mmol/L (ref 1.13–1.30)
Chloride: 102 mmol/L (ref 101–111)
Creatinine, Ser: 1.3 mg/dL — ABNORMAL HIGH (ref 0.61–1.24)
Glucose, Bld: 122 mg/dL — ABNORMAL HIGH (ref 65–99)
HCT: 37 % — ABNORMAL LOW (ref 39.0–52.0)
Hemoglobin: 12.6 g/dL — ABNORMAL LOW (ref 13.0–17.0)
Potassium: 4.2 mmol/L (ref 3.5–5.1)
Sodium: 140 mmol/L (ref 135–145)
TCO2: 23 mmol/L (ref 0–100)

## 2014-09-19 LAB — BASIC METABOLIC PANEL
ANION GAP: 7 (ref 5–15)
BUN: 12 mg/dL (ref 6–20)
CALCIUM: 7.4 mg/dL — AB (ref 8.9–10.3)
CO2: 25 mmol/L (ref 22–32)
CREATININE: 1.35 mg/dL — AB (ref 0.61–1.24)
Chloride: 106 mmol/L (ref 101–111)
GFR, EST NON AFRICAN AMERICAN: 52 mL/min — AB (ref >60)
Glucose, Bld: 150 mg/dL — ABNORMAL HIGH (ref 65–99)
Potassium: 4.2 mmol/L (ref 3.5–5.1)
SODIUM: 138 mmol/L (ref 135–145)

## 2014-09-19 LAB — CBC
HCT: 36.6 % — ABNORMAL LOW (ref 39.0–52.0)
HCT: 36.7 % — ABNORMAL LOW (ref 39.0–52.0)
Hemoglobin: 12.3 g/dL — ABNORMAL LOW (ref 13.0–17.0)
Hemoglobin: 12.3 g/dL — ABNORMAL LOW (ref 13.0–17.0)
MCH: 32.1 pg (ref 26.0–34.0)
MCH: 32.2 pg (ref 26.0–34.0)
MCHC: 33.6 g/dL (ref 30.0–36.0)
MCHC: 33.5 g/dL (ref 30.0–36.0)
MCV: 95.6 fL (ref 78.0–100.0)
MCV: 96.1 fL (ref 78.0–100.0)
PLATELETS: 136 10*3/uL — AB (ref 150–400)
PLATELETS: 113 10*3/uL — AB (ref 150–400)
RBC: 3.83 MIL/uL — ABNORMAL LOW (ref 4.22–5.81)
RBC: 3.82 MIL/uL — ABNORMAL LOW (ref 4.22–5.81)
RDW: 12.4 % (ref 11.5–15.5)
RDW: 12.7 % (ref 11.5–15.5)
WBC: 15.7 10*3/uL — AB (ref 4.0–10.5)
WBC: 15.1 10*3/uL — ABNORMAL HIGH (ref 4.0–10.5)

## 2014-09-19 LAB — POCT I-STAT 3, ART BLOOD GAS (G3+)
BICARBONATE: 26.1 meq/L — AB (ref 20.0–24.0)
O2 Saturation: 94 %
PCO2 ART: 46.4 mmHg — AB (ref 35.0–45.0)
PH ART: 7.36 (ref 7.350–7.450)
PO2 ART: 75 mmHg — AB (ref 80.0–100.0)
Patient temperature: 37.3
TCO2: 27 mmol/L (ref 0–100)

## 2014-09-19 LAB — MAGNESIUM
MAGNESIUM: 2.5 mg/dL — AB (ref 1.7–2.4)
MAGNESIUM: 2.5 mg/dL — AB (ref 1.7–2.4)

## 2014-09-19 MED ORDER — WARFARIN - PHYSICIAN DOSING INPATIENT
Freq: Every day | Status: DC
  Administered 2014-09-20: 18:00:00

## 2014-09-19 MED ORDER — FUROSEMIDE 10 MG/ML IJ SOLN
20.0000 mg | Freq: Four times a day (QID) | INTRAMUSCULAR | Status: AC
  Administered 2014-09-19 – 2014-09-20 (×3): 20 mg via INTRAVENOUS
  Filled 2014-09-19: qty 2

## 2014-09-19 MED ORDER — WARFARIN SODIUM 2.5 MG PO TABS
2.5000 mg | ORAL_TABLET | Freq: Every day | ORAL | Status: DC
  Administered 2014-09-19 – 2014-09-21 (×3): 2.5 mg via ORAL
  Filled 2014-09-19 (×5): qty 1

## 2014-09-19 MED ORDER — CHLORHEXIDINE GLUCONATE 0.12 % MT SOLN
OROMUCOSAL | Status: AC
Start: 2014-09-19 — End: 2014-09-20
  Filled 2014-09-19: qty 15

## 2014-09-19 MED ORDER — MORPHINE SULFATE (PF) 2 MG/ML IV SOLN: 2.0000 mg | INTRAVENOUS | Status: DC | PRN

## 2014-09-19 MED FILL — Heparin Sodium (Porcine) Inj 1000 Unit/ML: INTRAMUSCULAR | Qty: 20 | Status: AC

## 2014-09-19 MED FILL — Sodium Chloride IV Soln 0.9%: INTRAVENOUS | Qty: 3000 | Status: AC

## 2014-09-19 MED FILL — Sodium Bicarbonate IV Soln 8.4%: INTRAVENOUS | Qty: 50 | Status: AC

## 2014-09-19 MED FILL — Electrolyte-R (PH 7.4) Solution: INTRAVENOUS | Qty: 4000 | Status: AC

## 2014-09-19 MED FILL — Lidocaine HCl IV Inj 20 MG/ML: INTRAVENOUS | Qty: 5 | Status: AC

## 2014-09-19 MED FILL — Mannitol IV Soln 20%: INTRAVENOUS | Qty: 500 | Status: AC

## 2014-09-19 NOTE — Significant Event (Signed)
RN pulling air out of sleeve when trying to aspirate to flush line and cap off for ambulation. Did not flush line due to air. Spoke with Dr. Roxy Manns to make him aware. Per MD, can remove sleeve and A line. RN assisted patient back to bed and remove these lines. Will ambulate after bedrest. Arryn Terrones, RN.

## 2014-09-19 NOTE — Progress Notes (Signed)
Patient refused CPAP for the night. Patient wearing oxygen at 2lpm with Sp02=97%,will continue to monitor patient.

## 2014-09-19 NOTE — Progress Notes (Signed)
Patient ID: Jonathan Church, male   DOB: 1945-06-14, 69 y.o.   MRN: [id number] EVENING ROUNDS NOTE :     [address]       [place] [address]             [phone]                 1 Day Post-Op Procedure(s) (LRB): MINIMALLY INVASIVE MITRAL VALVE REPAIR (MVR) (Right) TRANSESOPHAGEAL ECHOCARDIOGRAM (TEE) (N/A)  Total Length of Stay:  LOS: 1 day  BP 120/56 mmHg  Pulse 71  Temp(Src) 98.7 F (37.1 C) (Oral)  Resp 20  Ht 5' 7.5" (1.715 m)  Wt 194 lb 10.7 oz (88.3 kg)  BMI 30.02 kg/m2  SpO2 91%  .Intake/Output      08/24 0701 - 08/25 0700 08/25 0701 - 08/26 0700   I.V. (mL/kg) 6138.1 (69.5) 33.8 (0.4)   Blood 955    NG/GT 60    IV Piggyback 1020 50   Total Intake(mL/kg) 8173.1 (92.6) 83.8 (0.9)   Urine (mL/kg/hr) 2855 (1.3) 1115 (1.1)   Emesis/NG output 50 (0) 0 (0)   Blood 1695 (0.8)    Chest Tube 490 (0.2) 340 (0.3)   Total Output 5090 1455   Net +3083.1 -1371.2        Emesis Occurrence  1 x     . sodium chloride 250 mL (09/19/14 0652)  . phenylephrine (NEO-SYNEPHRINE) Adult infusion Stopped (09/19/14 1030)     Lab Results  Component Value Date   WBC 15.1* 09/19/2014   HGB 12.3* 09/19/2014   HCT 36.7* 09/19/2014   PLT 113* 09/19/2014   GLUCOSE 122* 09/19/2014   ALT 14* 09/16/2014   AST 20 09/16/2014   NA 140 09/19/2014   K 4.2 09/19/2014   CL 102 09/19/2014   CREATININE 1.36* 09/19/2014   BUN 16 09/19/2014   CO2 25 09/19/2014   INR 1.37 09/18/2014   HGBA1C 5.8* 09/16/2014   Stable, walked around the unit   [place] [phone] Office [phone] 09/19/2014 6:41 PM

## 2014-09-19 NOTE — Progress Notes (Signed)
[address]       [place] [address]             [phone]        CARDIOTHORACIC SURGERY PROGRESS NOTE   R1 Day Post-Op Procedure(s) (LRB): MINIMALLY INVASIVE MITRAL VALVE REPAIR (MVR) (Right) TRANSESOPHAGEAL ECHOCARDIOGRAM (TEE) (N/A)  Subjective: Looks good.  Minimal soreness in chest.  No SOB.  Feels well.  Objective: Vital signs: BP Readings from Last 1 Encounters:  09/19/14 106/63   Pulse Readings from Last 1 Encounters:  09/19/14 86   Resp Readings from Last 1 Encounters:  09/19/14 16   Temp Readings from Last 1 Encounters:  09/19/14 99.3 F (37.4 C) Core (Comment)    Hemodynamics: PAP: (28-57)/(15-30) 47/25 mmHg CO:  [3.3 L/min-7.2 L/min] 7.2 L/min CI:  [1.7 L/min/m2-3.7 L/min/m2] 3.7 L/min/m2  Physical Exam:  Rhythm:   sinus  Breath sounds: clear  Heart sounds:  RRR w/out murmur + rub  Incisions:  Dressings dry, intact  Abdomen:  Soft, non-distended, non-tender  Extremities:  Warm, well-perfused  Chest tubes:  Low volume thin serosanguinous output, no air leak    Intake/Output from previous day: 08/24 0701 - 08/25 0700 In: 8078.1 [I.V.:6043.1; Blood:955; NG/GT:60; IV Piggyback:1020] Out: 5060 [Urine:2825; Emesis/NG output:50; Blood:1695; Chest Tube:490] Intake/Output this shift:    Lab Results:  CBC: Recent Labs  09/18/14 2240 09/18/14 2310 09/19/14 0414  WBC 15.7*  --  15.7*  HGB 13.3 13.9 12.3*  HCT 39.1 41.0 36.6*  PLT 137*  --  136*    BMET:  Recent Labs  09/16/14 1601  09/18/14 2310 09/19/14 0414  NA 140  < > 140 138  K 3.9  < > 4.6 4.2  CL 107  < > 105 106  CO2 25  --   --  25  GLUCOSE 92  < > 184* 150*  BUN 14  < > 14 12  CREATININE 1.57*  < > 1.30* 1.35*  CALCIUM 9.1  --   --  7.4*  < > = values in this interval not displayed.   PT/INR:   Recent Labs  09/18/14 1650  LABPROT 17.0*  INR 1.37    CBG (last 3)   Recent Labs  09/18/14 1919 09/18/14 2357 09/19/14 0357  GLUCAP 82 138*  144*    ABG    Component Value Date/Time   PHART 7.360 09/19/2014 0358   PCO2ART 46.4* 09/19/2014 0358   PO2ART 75.0* 09/19/2014 0358   HCO3 26.1* 09/19/2014 0358   TCO2 27 09/19/2014 0358   ACIDBASEDEF 3.0* 09/18/2014 2301   O2SAT 94.0 09/19/2014 0358    CXR: PORTABLE CHEST - 1 VIEW  COMPARISON: Portable chest x-ray of September 18, 2014  FINDINGS: Further increased density at the right lung base consistent with atelectasis. Minimal subsegmental atelectasis lateral to the left cardiac apex. The cardiac silhouette is enlarged. The central pulmonary vascularity is prominent. There is no significant cephalization of the vascular pattern. The Swan-Ganz catheter tip projects in the region of the distal pulmonary artery trunk. A prosthetic mitral valve ring is visible. The right-sided chest tubes are unchanged in position. No pneumothorax is observed.  IMPRESSION: Interval worsening of right basilar atelectasis with persistent minimal left basilar subsegmental atelectasis. There is no pneumothorax. There is no pulmonary edema.   Electronically Signed  By: David Martinique M.D.  On: 09/19/2014 07:18  Assessment/Plan: S/P Procedure(s) (LRB): MINIMALLY INVASIVE MITRAL VALVE REPAIR (MVR) (Right) TRANSESOPHAGEAL ECHOCARDIOGRAM (TEE) (N/A)  Doing very  well POD1 Maintaining NSR - AAI paced rhythm w/ stable hemodynamics on very low dose Neo drip for BP Expected post op acute blood loss anemia, very mild Expected post op atelectasis, R>L Expected post op volume excess, mild   Mobilize  D/C lines  Leave tubes in  Wean Neo drip  Hold diuretics until BP stable off Neo drip  Start coumadin slowly  Possible transfer stepdown later today or tomorrow   [name] 09/19/2014 7:51 AM

## 2014-09-19 NOTE — Progress Notes (Signed)
Utilization Review Completed.  

## 2014-09-20 ENCOUNTER — Inpatient Hospital Stay (HOSPITAL_COMMUNITY): Payer: Medicare Other

## 2014-09-20 LAB — PROTIME-INR
INR: 1.08 (ref 0.00–1.49)
Prothrombin Time: 14.2 seconds (ref 11.6–15.2)

## 2014-09-20 LAB — CBC
HEMATOCRIT: 38.2 % — AB (ref 39.0–52.0)
Hemoglobin: 12.4 g/dL — ABNORMAL LOW (ref 13.0–17.0)
MCH: 31.7 pg (ref 26.0–34.0)
MCHC: 32.5 g/dL (ref 30.0–36.0)
MCV: 97.7 fL (ref 78.0–100.0)
PLATELETS: 103 10*3/uL — AB (ref 150–400)
RBC: 3.91 MIL/uL — AB (ref 4.22–5.81)
RDW: 12.8 % (ref 11.5–15.5)
WBC: 12.4 10*3/uL — AB (ref 4.0–10.5)

## 2014-09-20 LAB — BASIC METABOLIC PANEL
Anion gap: 5 (ref 5–15)
BUN: 13 mg/dL (ref 6–20)
CO2: 33 mmol/L — ABNORMAL HIGH (ref 22–32)
CREATININE: 1.69 mg/dL — AB (ref 0.61–1.24)
Calcium: 7.9 mg/dL — ABNORMAL LOW (ref 8.9–10.3)
Chloride: 101 mmol/L (ref 101–111)
GFR, EST AFRICAN AMERICAN: 46 mL/min — AB (ref >60)
GFR, EST NON AFRICAN AMERICAN: 40 mL/min — AB (ref >60)
Glucose, Bld: 110 mg/dL — ABNORMAL HIGH (ref 65–99)
POTASSIUM: 4.4 mmol/L (ref 3.5–5.1)
SODIUM: 139 mmol/L (ref 135–145)

## 2014-09-20 LAB — GLUCOSE, CAPILLARY
GLUCOSE-CAPILLARY: 100 mg/dL — AB (ref 65–99)
Glucose-Capillary: 102 mg/dL — ABNORMAL HIGH (ref 65–99)

## 2014-09-20 MED ORDER — SODIUM CHLORIDE 0.9 % IV SOLN: 250.0000 mL | INTRAVENOUS | Status: DC | PRN

## 2014-09-20 MED ORDER — POTASSIUM CHLORIDE CRYS ER 20 MEQ PO TBCR
20.0000 meq | EXTENDED_RELEASE_TABLET | Freq: Every day | ORAL | Status: DC
  Administered 2014-09-20 – 2014-09-22 (×2): 20 meq via ORAL
  Filled 2014-09-20 (×4): qty 1

## 2014-09-20 MED ORDER — MOVING RIGHT ALONG BOOK
Freq: Once | Status: AC
  Administered 2014-09-20: 1
  Filled 2014-09-20: qty 1

## 2014-09-20 MED ORDER — CETYLPYRIDINIUM CHLORIDE 0.05 % MT LIQD
7.0000 mL | Freq: Two times a day (BID) | OROMUCOSAL | Status: DC
  Administered 2014-09-20 – 2014-09-22 (×4): 7 mL via OROMUCOSAL

## 2014-09-20 MED ORDER — FUROSEMIDE 40 MG PO TABS
40.0000 mg | ORAL_TABLET | Freq: Every day | ORAL | Status: DC
  Administered 2014-09-20 – 2014-09-22 (×3): 40 mg via ORAL
  Filled 2014-09-20 (×4): qty 1

## 2014-09-20 MED ORDER — SODIUM CHLORIDE 0.9 % IJ SOLN: 3.0000 mL | INTRAMUSCULAR | Status: DC | PRN

## 2014-09-20 MED ORDER — SODIUM CHLORIDE 0.9 % IJ SOLN
3.0000 mL | Freq: Two times a day (BID) | INTRAMUSCULAR | Status: DC
  Administered 2014-09-20 – 2014-09-22 (×4): 3 mL via INTRAVENOUS

## 2014-09-20 NOTE — Progress Notes (Signed)
      [address]       [place] [address]             [phone]        CARDIOTHORACIC SURGERY PROGRESS NOTE   R2 Days Post-Op Procedure(s) (LRB): MINIMALLY INVASIVE MITRAL VALVE REPAIR (MVR) (Right) TRANSESOPHAGEAL ECHOCARDIOGRAM (TEE) (N/A)  Subjective: Looks good and feels well.  Very mild soreness in chest.  Objective: Vital signs: BP Readings from Last 1 Encounters:  09/20/14 102/61   Pulse Readings from Last 1 Encounters:  09/20/14 70   Resp Readings from Last 1 Encounters:  09/20/14 11   Temp Readings from Last 1 Encounters:  09/20/14 98.4 F (36.9 C) Oral    Hemodynamics: PAP: (41)/(17) 41/17 mmHg  Physical Exam:  Rhythm:   sinus  Breath sounds: clear  Heart sounds:  RRR w/ rub, no murmur  Incisions:  Clean and dry  Abdomen:  Soft, non-distended, non-tender  Extremities:  Warm, well-perfused  Chest tubes:  Low volume thin serosanguinous output, no air leak    Intake/Output from previous day: 08/25 0701 - 08/26 0700 In: 733.8 [P.O.:600; I.V.:33.8; IV Piggyback:100] Out: 2707 [Urine:3890; Chest Tube:570] Intake/Output this shift:    Lab Results:  CBC: Recent Labs  09/19/14 1639 09/20/14 0230  WBC 15.1* 12.4*  HGB 12.3* 12.4*  HCT 36.7* 38.2*  PLT 113* 103*    BMET:  Recent Labs  09/19/14 0414 09/19/14 1632 09/19/14 1639 09/20/14 0230  NA 138 140  --  139  K 4.2 4.2  --  4.4  CL 106 102  --  101  CO2 25  --   --  33*  GLUCOSE 150* 122*  --  110*  BUN 12 16  --  13  CREATININE 1.35* 1.30* 1.36* 1.69*  CALCIUM 7.4*  --   --  7.9*     PT/INR:   Recent Labs  09/20/14 0230  LABPROT 14.2  INR 1.08    CBG (last 3)   Recent Labs  09/19/14 1924 09/19/14 2329 09/20/14 0346  GLUCAP 118* 125* 100*    ABG    Component Value Date/Time   PHART 7.360 09/19/2014 0358   PCO2ART 46.4* 09/19/2014 0358   PO2ART 75.0* 09/19/2014 0358   HCO3 26.1* 09/19/2014 0358   TCO2 23 09/19/2014 1632   ACIDBASEDEF 3.0*  09/18/2014 2301   O2SAT 94.0 09/19/2014 0358    CXR: PORTABLE CHEST - 1 VIEW  COMPARISON: 09/19/2014  FINDINGS: Cardiac shadow is mildly enlarged but stable. Postoperative changes are again seen. The Swan-Ganz catheter has been removed in the interval. Two right thoracostomy catheters remain. Right basilar atelectatic changes are again noted. No pneumothorax is seen.  IMPRESSION: Persistent right basilar atelectasis.  No new focal abnormality is seen.   Electronically Signed  By: Inez Catalina M.D.  On: 09/20/2014 07:12   Assessment/Plan: S/P Procedure(s) (LRB): MINIMALLY INVASIVE MITRAL VALVE REPAIR (MVR) (Right) TRANSESOPHAGEAL ECHOCARDIOGRAM (TEE) (N/A)  Doing well POD2 Maintaining NSR w/ stable BP Expected post op acute blood loss anemia, very mild Expected post op atelectasis, R>L Post op thrombocytopenia, mild Post op acute on chronic renal insufficiency, slight bump in creatinine, likely due to ATN and/or prerenal azotemia during early postop course   Mobilize  Keep chest tubes in 1 more day  Watch renal function  Transfer 2W stepdown  [name] 09/20/2014 8:38 AM

## 2014-09-20 NOTE — Discharge Summary (Signed)
Physician Discharge Summary  Patient ID: Jonathan Church MRN: [id number] DOB/AGE: 1945-06-29 69 y.o.  Admit date: 09/18/2014 Discharge date: 09/22/2014  Admission Diagnoses:  Patient Active Problem List   Diagnosis Date Noted  . Mitral regurgitation   . Preoperative cardiovascular examination   . Severe mitral regurgitation 05/16/2014  . Fatigue 05/08/2014  . Disturbance, sleep 05/08/2014  . Heart murmur 05/08/2014  . Vitamin D deficiency 05/08/2014   Discharge Diagnoses:   Patient Active Problem List   Diagnosis Date Noted  . S/P minimally invasive mitral valve repair 09/18/2014  . Mitral regurgitation   . Preoperative cardiovascular examination   . Severe mitral regurgitation 05/16/2014  . Fatigue 05/08/2014  . Disturbance, sleep 05/08/2014  . Heart murmur 05/08/2014  . Vitamin D deficiency 05/08/2014   Discharged Condition: good  History of Present Illness:  Jonathan Church is a 69 year old previously healthy male who was recently discovered to have a heart murmur on routine physical exam by his primary care physician. He was referred to Dr. Harl Bowie for cardiology consultation, and transthoracic and transesophageal echocardiograms revealed the presence of mitral valve prolapse with a large flail segment of the posterior leaflet and severe mitral regurgitation. The patient was referred to [facility] for elective surgical consultation. He was originally seen in consultation by Dr. Roxy Manns on 06/28/2014 and he was seen more recently on 08/12/2014. He reports no new problems or complaints over the past month. He does report a very slow gradual onset of mild exertional shortness of breath and fatigue over the last few years. He states that he only gets short of breath with very strenuous activity, but he has noted a slight decrease in his exercise tolerance. He denies any shortness of breath with ordinary activity. He has never had any chest discomfort or shortness of breath at rest. He has never  had any symptoms of PND, orthopnea, or lower extremity edema.  It was felt the patient would benefit from surgical intervention of his Mitral Valve.  The risks and benefits of the procedure were explained to the patient and he was agreeable to proceed.   Hospital Course:   He presented to [facility] on 09/18/2014.  He was taken to the operating room and underwent Minimally Invasive Mitral Valve Repair with annuloplasty utilizing a #32 Memo Rechord Ring, Complex Valvuloplasty with triangular resection of the posterior leaflet, and placement of artifical Gore-tex Neo Chords x 4.  He tolerated the procedure and was taken to the SICU in stable condition.  He was extubated the evening of surgery.  During his stay in the SICU the patient require use of Neo-synephrine for additional blood pressure support.  This drip was weaned as tolerated.  He has been maintaining NSR.  He was started on Coumadin at a low dose for his valve repair.  His last INR was 1.02. Per Dr. Roxy Manns, he is to remain on 2.5 mg of Coumadin for now.He was ambulating in the SICU and felt medically stable for transfer to the step down unit on POD #2.   EPW were removed on 08/27. Chest tube output continued to decrease and they were removed later on 08/27. Follow up CXR was stable. He was ambulating on 2 liters of oxygen via [place]. He was weaned to room air. He is tolerating a diet and has had a bowel movement. He is felt surgically stable for discharge today.  Significant Diagnostic Studies:  EXAM: CHEST 2 VIEW  COMPARISON: 09/20/2014  FINDINGS: Midline trachea. Mild cardiomegaly. Removal of  right-sided chest tubes. No apical pneumothorax identified. Trace right pleural fluid or thickening remains, only readily apparent on the lateral view. Right hemidiaphragm elevation with persistent right base atelectasis. Minimal left base subsegmental atelectasis.  IMPRESSION: Removal of right-sided chest tubes, without  pneumothorax.  Slight improvement in right base atelectasis with right hemidiaphragm elevation.  Developing left base subsegmental atelectasis.   Electronically Signed  By: Abigail Miyamoto M.D.  On: 09/22/2014 08:55  ECHO:   Left ventricle: The cavity size was normal. Systolic function was normal. The estimated ejection fraction was in the range of 60% to 65%. Wall motion was normal; there were no regional wall motion abnormalities. 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 Suggest TEE and consideration for repair if clinically indicated. - Left atrium: The atrium was mildly dilated. - Atrial septum: No defect or patent foramen ovale was identified  Treatments: surgery:    Minimally-Invasive Mitral Valve Repair Complex valvuloplasty including triangular resection of posterior leaflet Artificial Gore-tex neochord placement x4 Sorin Memo 3D Rechord Ring Annuloplasty (size 32 mm, catalog # [id number], serial # [id number])  Disposition: Stable condition and discharged to home.  Discharge Medications:   Medication List    TAKE these medications        amiodarone 200 MG tablet  Commonly known as:  PACERONE  Take 1 tablet (200 mg total) by mouth daily.     aspirin 81 MG EC tablet  Take 1 tablet (81 mg total) by mouth daily.     furosemide 40 MG tablet  Commonly known as:  LASIX  Take 1 tablet (40 mg total) by mouth daily. For 5 days then stop.     metoprolol tartrate 25 MG tablet  Commonly known as:  LOPRESSOR  Take 0.5 tablets (12.5 mg total) by mouth 2 (two) times daily.     OVER THE COUNTER MEDICATION  Take 80,000 Int'l Units by mouth daily. Serapeptase 80,000 IU daily     oxyCODONE 5 MG immediate release tablet  Commonly known as:  Oxy IR/ROXICODONE  Take 1-2 tablets (5-10 mg total) by mouth every 4 (four) hours as needed for severe pain.     potassium chloride SA 20 MEQ tablet   Commonly known as:  K-DUR,KLOR-CON  Take 1 tablet (20 mEq total) by mouth daily. For 5 days then stop.     warfarin 2.5 MG tablet  Commonly known as:  COUMADIN  Take 1 tablet (2.5 mg total) by mouth daily at 6 PM.       The patient has been discharged on:   1.Beta Blocker:  Yes [ x  ]                              No   [   ]                              If No, reason:  2.Ace Inhibitor/ARB: Yes [   ]                                     No  [ x   ]  If No, reason: Labile blood pressure  3.Statin:   Yes [   ]                  No  [ x  ]                  If No, reason: No  CAD  4.S[place]a [place]:  Yes  [ x  ]                  No   [   ]                  If No, reason:       Follow-up Information    Follow up with [name], MD On 10/07/2014.   Specialty:  Cardiothoracic Surgery   Why:  Appointment is at 11:30   Contact information:   [address] [place] [address] [phone]       Follow up with [facility] On 10/07/2014.   Why:  please get CXR at 11:00 on first floor of [facility] information:   [facility]       Follow up with Carlyle Dolly, MD.   Specialty:  Cardiology   Why:  Call to have a PT and INR (as is on Coumadin, INR 2-2.5) drawn on Monday    Contact information:   [place] [address] [phone]       Follow up with London Pepper, MD.   Specialty:  Family Medicine   Why:  Call for a follow up appointment regarding further surveillance of HGA1C 5.8 (pre diabetes)   Contact information:   [address] [place] [address] [phone]       Follow up with Carlyle Dolly, MD.   Specialty:  Cardiology   Why:  Call for a follow up appointment for 2 weeks   Contact information:   [place] [address] [phone]       Signed: Arnoldo Lenis 09/22/2014, 9:37 AM

## 2014-09-20 NOTE — Progress Notes (Signed)
Per Pt, he wears CPAP at night at home.  Pt states he does not want to wear CPAP in hospital presently. Pt is aware that if he changes his mind he can notify RN/RT.

## 2014-09-20 NOTE — Progress Notes (Signed)
[facility] BRIEF SICU PROGRESS NOTE  2 Days Post-Op  S/P Procedure(s) (LRB): MINIMALLY INVASIVE MITRAL VALVE REPAIR (MVR) (Right) TRANSESOPHAGEAL ECHOCARDIOGRAM (TEE) (N/A)   Stable day Maintaining NSR Awaiting bed on step down unit for transfer  Plan: Continue current plan  Jonathan Church 09/20/2014 7:57 PM

## 2014-09-20 NOTE — Progress Notes (Signed)
Attempted to call report and charge nurse says that the [facility] bed was not ready at this timea for transfer of patient [id number] will let charge nurse know

## 2014-09-21 LAB — PROTIME-INR
INR: 1.04 (ref 0.00–1.49)
Prothrombin Time: 13.8 seconds (ref 11.6–15.2)

## 2014-09-21 LAB — BASIC METABOLIC PANEL
Anion gap: 4 — ABNORMAL LOW (ref 5–15)
BUN: 18 mg/dL (ref 6–20)
CALCIUM: 7.9 mg/dL — AB (ref 8.9–10.3)
CO2: 35 mmol/L — AB (ref 22–32)
CREATININE: 1.5 mg/dL — AB (ref 0.61–1.24)
Chloride: 101 mmol/L (ref 101–111)
GFR calc Af Amer: 53 mL/min — ABNORMAL LOW (ref >60)
GFR calc non Af Amer: 46 mL/min — ABNORMAL LOW (ref >60)
GLUCOSE: 113 mg/dL — AB (ref 65–99)
Potassium: 3.8 mmol/L (ref 3.5–5.1)
Sodium: 140 mmol/L (ref 135–145)

## 2014-09-21 LAB — CBC
HEMATOCRIT: 35.3 % — AB (ref 39.0–52.0)
Hemoglobin: 11.5 g/dL — ABNORMAL LOW (ref 13.0–17.0)
MCH: 31.8 pg (ref 26.0–34.0)
MCHC: 32.6 g/dL (ref 30.0–36.0)
MCV: 97.5 fL (ref 78.0–100.0)
Platelets: 106 10*3/uL — ABNORMAL LOW (ref 150–400)
RBC: 3.62 MIL/uL — ABNORMAL LOW (ref 4.22–5.81)
RDW: 12.5 % (ref 11.5–15.5)
WBC: 9.4 10*3/uL (ref 4.0–10.5)

## 2014-09-21 MED ORDER — POTASSIUM CHLORIDE CRYS ER 20 MEQ PO TBCR
20.0000 meq | EXTENDED_RELEASE_TABLET | Freq: Once | ORAL | Status: AC
  Administered 2014-09-21: 20 meq via ORAL
  Filled 2014-09-21: qty 1

## 2014-09-21 MED ORDER — AMIODARONE HCL 200 MG PO TABS
200.0000 mg | ORAL_TABLET | Freq: Two times a day (BID) | ORAL | Status: DC
  Administered 2014-09-21 (×2): 200 mg via ORAL
  Filled 2014-09-21 (×4): qty 1

## 2014-09-21 MED ORDER — ASPIRIN EC 81 MG PO TBEC
81.0000 mg | DELAYED_RELEASE_TABLET | Freq: Every day | ORAL | Status: DC
  Administered 2014-09-21 – 2014-09-22 (×2): 81 mg via ORAL
  Filled 2014-09-21 (×3): qty 1

## 2014-09-21 MED ORDER — WARFARIN VIDEO: Freq: Once | Status: DC

## 2014-09-21 NOTE — Discharge Instructions (Signed)
Mitral Valve Repair, Care After Refer to this sheet in the next few weeks. These instructions provide you with information on caring for yourself after your procedure. Your health care provider may also give you specific instructions. Your treatment has been planned according to current medical practices, but problems sometimes occur. Call your health care provider if you have any problems or questions after your procedure.  HOME CARE INSTRUCTIONS   Take medicines only as directed by your health care provider.  Take your temperature every morning for the first 7 days after surgery. Write these down.  Weigh yourself every morning for at least 7 days after surgery. Write your weight down.  Wear elastic stockings during the day for at least 2 weeks after surgery or as directed by your health care provider. Use them longer if your ankles are swollen. The stockings help blood flow and help reduce swelling in the legs.  Take frequent naps or rest often throughout the day.  Avoid lifting more than 10 lb (4.5 kg) or pushing or pulling things with your arms for 6-8 weeks or as directed by your health care provider.  Avoid driving or airplane travel for 4-6 weeks after surgery or as directed. If you are riding in a car for an extended period, stop every 1-2 hours to stretch your legs.  Avoid crossing your legs.  Avoid climbing stairs and using the handrail to pull yourself up for the first 2-3 weeks after surgery.  Do not take baths for 2-4 weeks after surgery. Take showers once your health care provider approves. Pat incisions dry. Do not rub incisions with a washcloth or towel.  Return to work as directed by your health care provider.  Drink enough fluids to keep your urine clear or pale yellow.  Do not strain to have a bowel movement. Eat high-fiber foods if you become constipated. You may also take a medicine to help you have a bowel movement (laxative) as directed by your health care  provider.  Resume sexual activity as directed by your health care provider. SEEK MEDICAL CARE IF:   You develop a skin rash.   Your weight is increasing each day over 2-3 days.  Your weight increases by 2 or more pounds (1 kg) in a single day.  You have a fever. SEEK IMMEDIATE MEDICAL CARE IF:   You develop chest pain that is not coming from your incision.  You develop shortness of breath or difficulty breathing.  You have drainage, redness, swelling, or pain at your incision site.  You have pus coming from your incision.  You develop light-headedness. MAKE SURE YOU:  Understand these directions.  Will watch your condition.  Will get help right away if you are not doing well or get worse. Document Released: 07/31/2004 Document Revised: 05/28/2013 Document Reviewed: 06/13/2012 [facility] Patient Information 2015 [place], [place]. This information is not intended to replace advice given to you by your health care provider. Make sure you discuss any questions you have with your health care provider.  ---------------------------------------- Information on my medicine - Coumadin   (Warfarin)  This medication education was reviewed with me or my healthcare representative as part of my discharge preparation.  The pharmacist that spoke with me during my hospital stay was:  Dareen Piano, [facility]  Why was Coumadin prescribed for you? Coumadin was prescribed for you because you have a blood clot or a medical condition that can cause an increased risk of forming blood clots. Blood clots can cause serious  health problems by blocking the flow of blood to the heart, lung, or brain. Coumadin can prevent harmful blood clots from forming. As a reminder your indication for Coumadin is:   Blood Clot Prevention After Heart Valve Surgery  What test will check on my response to Coumadin? While on Coumadin (warfarin) you will need to have an INR test regularly to ensure that your dose is  keeping you in the desired range. The INR (international normalized ratio) number is calculated from the result of the laboratory test called prothrombin time (PT).  If an INR APPOINTMENT HAS NOT ALREADY BEEN MADE FOR YOU please schedule an appointment to have this lab work done by your health care provider within 7 days. Your INR goal is usually a number between:  2 to 3 or your provider may give you a more narrow range like 2-2.5.  Ask your health care provider during an office visit what your goal INR is.  What  do you need to  know  About  COUMADIN? Take Coumadin (warfarin) exactly as prescribed by your healthcare provider about the same time each day.  DO NOT stop taking without talking to the doctor who prescribed the medication.  Stopping without other blood clot prevention medication to take the place of Coumadin may increase your risk of developing a new clot or stroke.  Get refills before you run out.  What do you do if you miss a dose? If you miss a dose, take it as soon as you remember on the same day then continue your regularly scheduled regimen the next day.  Do not take two doses of Coumadin at the same time.  Important Safety Information A possible side effect of Coumadin (Warfarin) is an increased risk of bleeding. You should call your healthcare provider right away if you experience any of the following: ? Bleeding from an injury or your nose that does not stop. ? Unusual colored urine (red or dark brown) or unusual colored stools (red or black). ? Unusual bruising for unknown reasons. ? A serious fall or if you hit your head (even if there is no bleeding).  Some foods or medicines interact with Coumadin (warfarin) and might alter your response to warfarin. To help avoid this: ? Eat a balanced diet, maintaining a consistent amount of Vitamin K. ? Notify your provider about major diet changes you plan to make. ? Avoid alcohol or limit your intake to 1 drink for women and 2  drinks for men per day. (1 drink is 5 oz. wine, 12 oz. beer, or 1.5 oz. liquor.)  Make sure that ANY health care provider who prescribes medication for you knows that you are taking Coumadin (warfarin).  Also make sure the healthcare provider who is monitoring your Coumadin knows when you have started a new medication including herbals and non-prescription products.  Coumadin (Warfarin)  Major Drug Interactions  Increased Warfarin Effect Decreased Warfarin Effect  Alcohol (large quantities) Antibiotics (esp. Septra/Bactrim, Flagyl, Cipro) Amiodarone (Cordarone) Aspirin (ASA) Cimetidine (Tagamet) Megestrol (Megace) NSAIDs (ibuprofen, naproxen, etc.) Piroxicam (Feldene) Propafenone (Rythmol SR) Propranolol (Inderal) Isoniazid (INH) Posaconazole (Noxafil) Barbiturates (Phenobarbital) Carbamazepine (Tegretol) Chlordiazepoxide (Librium) Cholestyramine (Questran) Griseofulvin Oral Contraceptives Rifampin Sucralfate (Carafate) Vitamin K   Coumadin (Warfarin) Major Herbal Interactions  Increased Warfarin Effect Decreased Warfarin Effect  Garlic Ginseng Ginkgo biloba Coenzyme Q10 Green tea St. Johns wort    Coumadin (Warfarin) FOOD Interactions  Eat a consistent number of servings per week of foods HIGH in Vitamin K (1 serving =  cup)  Collards (cooked, or boiled & drained) Kale (cooked, or boiled & drained) Mustard greens (cooked, or boiled & drained) Parsley *serving size only =  cup Spinach (cooked, or boiled & drained) Swiss chard (cooked, or boiled & drained) Turnip greens (cooked, or boiled & drained)  Eat a consistent number of servings per week of foods MEDIUM-HIGH in Vitamin K (1 serving = 1 cup)  Asparagus (cooked, or boiled & drained) Broccoli (cooked, boiled & drained, or raw & chopped) Brussel sprouts (cooked, or boiled & drained) *serving size only =  cup Lettuce, raw (green leaf, endive, romaine) Spinach, raw Turnip greens, raw & chopped   These  websites have more information on Coumadin (warfarin):  FailFactory.se; VeganReport.com.au;

## 2014-09-21 NOTE — Progress Notes (Addendum)
CARDIAC REHAB PHASE I   PRE:  Rate/Rhythm: Sinus Rhythm 68  BP:  Supine: 104/50       SaO2: 94% 2l/min  MODE:  Ambulation: 500 ft   POST:  Rate/Rhythem: Sinus 71  BP:    Sitting: 104/60     SaO2: 94% 2l/min  0820-0900  Patient ambulated independently in the hallway using the rolling walker on 2l/min of oxygen. Chest tube intact. Patient assisted back to the recliner with call bell within reach. Oxygen decreased to 1l/min. Patient encouraged to use his incentive spirometer.  Whitaker, Christa See RN BSN

## 2014-09-21 NOTE — Progress Notes (Addendum)
      [address]       [place] [address]             [phone]        3 Days Post-Op Procedure(s) (LRB): MINIMALLY INVASIVE MITRAL VALVE REPAIR (MVR) (Right) TRANSESOPHAGEAL ECHOCARDIOGRAM (TEE) (N/A)  Subjective: Patient has pain "deep in chest at incision area"  Objective: Vital signs in last 24 hours: Temp:  [98.3 F (36.8 C)-98.6 F (37 C)] 98.6 F (37 C) (08/27 0527) Pulse Rate:  [63-70] 68 (08/27 0527) Cardiac Rhythm:  [-] Normal sinus rhythm (08/26 2200) Resp:  [11-20] 16 (08/27 0527) BP: (85-115)/(50-67) 101/54 mmHg (08/27 0527) SpO2:  [92 %-99 %] 95 % (08/27 0527) Weight:  [186 lb 11.2 oz (84.687 kg)] 186 lb 11.2 oz (84.687 kg) (08/27 0527)  Pre op weight 82 kg Current Weight  09/21/14 186 lb 11.2 oz (84.687 kg)       Intake/Output from previous day: 08/26 0701 - 08/27 0700 In: 882 [P.O.:832; IV Piggyback:50] Out: 2385 [Urine:2215; Chest Tube:170]   Physical Exam:  Cardiovascular: RRR, no murmur Pulmonary: Diminished right base. Abdomen: Soft, non tender, bowel sounds present. Extremities: Mild bilateral lower extremity edema. Wounds: Dressing is clean and dry.    Lab Results: CBC: Recent Labs  09/20/14 0230 09/21/14 0318  WBC 12.4* 9.4  HGB 12.4* 11.5*  HCT 38.2* 35.3*  PLT 103* 106*   BMET:  Recent Labs  09/20/14 0230 09/21/14 0318  NA 139 140  K 4.4 3.8  CL 101 101  CO2 33* 35*  GLUCOSE 110* 113*  BUN 13 18  CREATININE 1.69* 1.50*  CALCIUM 7.9* 7.9*    PT/INR:  Lab Results  Component Value Date   INR 1.04 09/21/2014   INR 1.08 09/20/2014   INR 1.37 09/18/2014   ABG:  INR: Will add last result for INR, ABG once components are confirmed Will add last 4 CBG results once components are confirmed  Assessment/Plan:  1. CV - SR in the 74' this am. On Lopressor 12.5 mg bid and Coumadin. INR 1.04. BP labile. Parameters for Lopressor. Will decrease ecasa to 81 mg daily. 2.  Pulmonary - Chest tubes with  170 cc last 24 hours. Will remove EPW and then chest tubes today. On 2 liters of oxygen via [place]. Wean to room air as tolerates. Encourage incentive spirometer. 3. Volume Overload - On Lasix 40 mg daily 4.  Acute blood loss anemia - H and H decreased to 11.5 and 35.3 5. Acute on chronic renal insufficiency-creatinine slightly decreased from 1.69 to 1.5. Monitor 6. Mild thrombocytopenia-platelets stable at 106,000 7. Supplement potassium  [name],[name] 09/21/2014,7:40 AM  I have seen and examined the patient and agree with the assessment and plan as outlined.  Doing well.  Resume low dose amiodarone for prophylaxis.  Possible d/c home 1-2 days.  [name] 09/21/2014 12:16 PM

## 2014-09-22 ENCOUNTER — Inpatient Hospital Stay (HOSPITAL_COMMUNITY): Payer: Medicare Other

## 2014-09-22 LAB — BASIC METABOLIC PANEL
Anion gap: 8 (ref 5–15)
BUN: 16 mg/dL (ref 6–20)
CHLORIDE: 97 mmol/L — AB (ref 101–111)
CO2: 33 mmol/L — ABNORMAL HIGH (ref 22–32)
Calcium: 8.4 mg/dL — ABNORMAL LOW (ref 8.9–10.3)
Creatinine, Ser: 1.52 mg/dL — ABNORMAL HIGH (ref 0.61–1.24)
GFR, EST AFRICAN AMERICAN: 53 mL/min — AB (ref >60)
GFR, EST NON AFRICAN AMERICAN: 45 mL/min — AB (ref >60)
Glucose, Bld: 122 mg/dL — ABNORMAL HIGH (ref 65–99)
POTASSIUM: 3.9 mmol/L (ref 3.5–5.1)
SODIUM: 138 mmol/L (ref 135–145)

## 2014-09-22 LAB — PROTIME-INR
INR: 1.02 (ref 0.00–1.49)
Prothrombin Time: 13.6 seconds (ref 11.6–15.2)

## 2014-09-22 MED ORDER — WARFARIN SODIUM 2.5 MG PO TABS: 2.5000 mg | ORAL_TABLET | Freq: Every day | ORAL | Status: DC

## 2014-09-22 MED ORDER — AMIODARONE HCL 200 MG PO TABS: 200.0000 mg | ORAL_TABLET | Freq: Every day | ORAL | Status: DC

## 2014-09-22 MED ORDER — METOPROLOL TARTRATE 25 MG PO TABS: 12.5000 mg | ORAL_TABLET | Freq: Two times a day (BID) | ORAL | Status: DC

## 2014-09-22 MED ORDER — POTASSIUM CHLORIDE CRYS ER 20 MEQ PO TBCR: 20.0000 meq | EXTENDED_RELEASE_TABLET | Freq: Every day | ORAL | Status: DC

## 2014-09-22 MED ORDER — METOPROLOL TARTRATE 25 MG PO TABS
12.5000 mg | ORAL_TABLET | Freq: Two times a day (BID) | ORAL | Status: DC
Start: 2014-09-22 — End: 2014-10-21

## 2014-09-22 MED ORDER — ASPIRIN 81 MG PO TBEC: 81.0000 mg | DELAYED_RELEASE_TABLET | Freq: Every day | ORAL | Status: DC

## 2014-09-22 MED ORDER — AMIODARONE HCL 200 MG PO TABS: 200.0000 mg | ORAL_TABLET | Freq: Two times a day (BID) | ORAL | Status: DC

## 2014-09-22 MED ORDER — OXYCODONE HCL 5 MG PO TABS: 5.0000 mg | ORAL_TABLET | ORAL | Status: DC | PRN

## 2014-09-22 MED ORDER — WARFARIN SODIUM 5 MG PO TABS: 5.0000 mg | ORAL_TABLET | Freq: Every day | ORAL | Status: DC

## 2014-09-22 MED ORDER — AMIODARONE HCL 200 MG PO TABS
200.0000 mg | ORAL_TABLET | Freq: Every day | ORAL | Status: DC
  Administered 2014-09-22: 200 mg via ORAL

## 2014-09-22 MED ORDER — POTASSIUM CHLORIDE CRYS ER 20 MEQ PO TBCR
20.0000 meq | EXTENDED_RELEASE_TABLET | Freq: Once | ORAL | Status: AC
  Filled 2014-09-22: qty 1

## 2014-09-22 MED ORDER — FUROSEMIDE 40 MG PO TABS: 40.0000 mg | ORAL_TABLET | Freq: Every day | ORAL | Status: DC

## 2014-09-22 NOTE — Progress Notes (Addendum)
      [address]       [place] [address]             [phone]        4 Days Post-Op Procedure(s) (LRB): MINIMALLY INVASIVE MITRAL VALVE REPAIR (MVR) (Right) TRANSESOPHAGEAL ECHOCARDIOGRAM (TEE) (N/A)  Subjective: Patient with less pain now that chest tubes removed. He had a small bowel movement this am. He wishes to go home.  Objective: Vital signs in last 24 hours: Temp:  [98.8 F (37.1 C)-99.2 F (37.3 C)] 99.2 F (37.3 C) (08/28 0428) Pulse Rate:  [64-72] 72 (08/28 0428) Cardiac Rhythm:  [-] Normal sinus rhythm (08/27 1950) Resp:  [18] 18 (08/28 0428) BP: (90-117)/(43-67) 98/58 mmHg (08/28 0428) SpO2:  [91 %-97 %] 91 % (08/28 0428) Weight:  [184 lb (83.462 kg)] 184 lb (83.462 kg) (08/28 0428)  Pre op weight 82 kg Current Weight  09/22/14 184 lb (83.462 kg)       Intake/Output from previous day: 08/27 0701 - 08/28 0700 In: 240 [P.O.:240] Out: 2215 [Urine:2075; Chest Tube:140]   Physical Exam:  Cardiovascular: RRR, no murmur Pulmonary: Diminished right base and left lung is clear Abdomen: Soft, non tender, bowel sounds present. Extremities: Trace bilateral lower extremity edema. Wounds: Clean and dry. No signs of infection  Lab Results: CBC:  Recent Labs  09/20/14 0230 09/21/14 0318  WBC 12.4* 9.4  HGB 12.4* 11.5*  HCT 38.2* 35.3*  PLT 103* 106*   BMET:   Recent Labs  09/21/14 0318 09/22/14 0433  NA 140 138  K 3.8 3.9  CL 101 97*  CO2 35* 33*  GLUCOSE 113* 122*  BUN 18 16  CREATININE 1.50* 1.52*  CALCIUM 7.9* 8.4*    PT/INR:  Lab Results  Component Value Date   INR 1.02 09/22/2014   INR 1.04 09/21/2014   INR 1.08 09/20/2014   ABG:  INR: Will add last result for INR, ABG once components are confirmed Will add last 4 CBG results once components are confirmed  Assessment/Plan:  1. CV - SR in the 60' this am. On Lopressor 12.5 mg bid and Coumadin. INR slightly decreased to 1.02 and is on Coumadin 2.5 mg.  Per Dr. Roxy Manns, continue with 2.5 mg daily. BP occasionally labile so continue with parameters for Lopressor. QTc around 480 so will decrease Amiodarone to 200 mg daily. 2.  Pulmonary - On room air. CXR shows elevation of right hemidiaphragm, bibasilar atelectasis, no pneumothorax. Encourage incentive spirometer. 3. Volume Overload - On Lasix 40 mg daily 4.  Acute blood loss anemia - Last H and H 11.5 and 35.3 5. Acute on chronic renal insufficiency-creatinine remains 1.5  6. Mild thrombocytopenia-platelets stable at 106,000 7. Supplement potassium 8. Low grade fever earlier this am. Likely atelectasis as no wound infection. 9. Discharge later today  [name],[name] 09/22/2014,7:38 AM  7:38 AM   I have seen and examined the patient and agree with the assessment and plan as outlined.  D/C home.  Instructions given.  [name] 09/22/2014 10:39 AM

## 2014-09-23 ENCOUNTER — Ambulatory Visit (INDEPENDENT_AMBULATORY_CARE_PROVIDER_SITE_OTHER): Payer: Medicare Other | Admitting: Pharmacist

## 2014-09-23 DIAGNOSIS — Z9889 Other specified postprocedural states: Secondary | ICD-10-CM

## 2014-09-23 DIAGNOSIS — I340 Nonrheumatic mitral (valve) insufficiency: Secondary | ICD-10-CM

## 2014-09-23 LAB — POCT INR: INR: 1

## 2014-09-24 ENCOUNTER — Telehealth (HOSPITAL_COMMUNITY): Payer: Self-pay | Admitting: *Deleted

## 2014-09-26 ENCOUNTER — Other Ambulatory Visit: Payer: Self-pay | Admitting: *Deleted

## 2014-10-01 ENCOUNTER — Ambulatory Visit (INDEPENDENT_AMBULATORY_CARE_PROVIDER_SITE_OTHER): Payer: Medicare Other | Admitting: *Deleted

## 2014-10-01 DIAGNOSIS — I340 Nonrheumatic mitral (valve) insufficiency: Secondary | ICD-10-CM

## 2014-10-01 DIAGNOSIS — Z9889 Other specified postprocedural states: Secondary | ICD-10-CM

## 2014-10-01 LAB — POCT INR: INR: 1.6

## 2014-10-03 ENCOUNTER — Other Ambulatory Visit: Payer: Self-pay | Admitting: Thoracic Surgery (Cardiothoracic Vascular Surgery)

## 2014-10-03 DIAGNOSIS — Z9889 Other specified postprocedural states: Secondary | ICD-10-CM

## 2014-10-04 ENCOUNTER — Ambulatory Visit (INDEPENDENT_AMBULATORY_CARE_PROVIDER_SITE_OTHER): Payer: Self-pay | Admitting: Thoracic Surgery (Cardiothoracic Vascular Surgery)

## 2014-10-04 ENCOUNTER — Encounter: Payer: Self-pay | Admitting: Thoracic Surgery (Cardiothoracic Vascular Surgery)

## 2014-10-04 ENCOUNTER — Ambulatory Visit
Admission: RE | Admit: 2014-10-04 | Discharge: 2014-10-04 | Disposition: A | Discharge status: Home / Self Care | Payer: Medicare Other | Source: Ambulatory Visit | Attending: Thoracic Surgery (Cardiothoracic Vascular Surgery) | Admitting: Thoracic Surgery (Cardiothoracic Vascular Surgery)

## 2014-10-04 VITALS — BP 97/68 | HR 77 | Resp 20 | Ht 67.5 in

## 2014-10-04 DIAGNOSIS — Z9889 Other specified postprocedural states: Secondary | ICD-10-CM

## 2014-10-04 DIAGNOSIS — I340 Nonrheumatic mitral (valve) insufficiency: Secondary | ICD-10-CM

## 2014-10-04 MED ORDER — FUROSEMIDE 20 MG PO TABS: 20.0000 mg | ORAL_TABLET | Freq: Every day | ORAL | Status: DC

## 2014-10-04 MED ORDER — POTASSIUM CHLORIDE CRYS ER 20 MEQ PO TBCR: 20.0000 meq | EXTENDED_RELEASE_TABLET | Freq: Every day | ORAL | Status: DC

## 2014-10-04 NOTE — Progress Notes (Signed)
[address]       [place] [address]             [phone]     CARDIOTHORACIC SURGERY OFFICE NOTE  Referring Provider is Arnoldo Lenis, MD PCP is London Pepper, MD   HPI:  Patient returns for follow-up status post minimally invasive mitral valve repair on 09/18/2014 for mitral valve prolapse with severe symptomatic mitral regurgitation.  His early postoperative recovery in the hospital was entirely uneventful and he was discharged home on the fourth postoperative day. The patient states that initially he did extremely well, but over the last week he is concerned that his exercise tolerance has not improved very quickly. Early after discharge he had one day where he experienced low-grade temperature of 99-101F. This resolved and over the last week he has had only occasional slight elevation of temperature to 42F. Appetite is good. He still gets fatigued and short of breath with activity but he is walking as much as half a mile at a time without much difficulty. His weight is below baseline. He states that he had a bad night last night with some difficulty sleeping. He took his last Lasix tablet and feels that it helped his breathing.  He thinks he needs a refill for Lasix.  He no longer has any significant soreness in his chest and he has not been taking any sort of pain relievers. He has not had palpitations or dizzy spells.  The patient had his prothrombin time checked earlier this week and he remains subtherapeutic with INR 1.6. His Coumadin dose has been adjusted.   Current Outpatient Prescriptions  Medication Sig Dispense Refill  . aspirin EC 81 MG EC tablet Take 1 tablet (81 mg total) by mouth daily.    . metoprolol tartrate (LOPRESSOR) 25 MG tablet Take 0.5 tablets (12.5 mg total) by mouth 2 (two) times daily. 30 tablet 1  . warfarin (COUMADIN) 2.5 MG tablet Take 1 tablet (2.5 mg total) by mouth daily at 6 PM. 30 tablet 1  . furosemide (LASIX) 20 MG  tablet Take 1 tablet (20 mg total) by mouth daily. 30 tablet 0  . potassium chloride SA (K-DUR,KLOR-CON) 20 MEQ tablet Take 1 tablet (20 mEq total) by mouth daily. With food 30 tablet 0   No current facility-administered medications for this visit.      Physical Exam:   BP 97/68 mmHg  Pulse 77  Resp 20  Ht 5' 7.5" (1.715 m)  SpO2 96%  General:  Well-appearing  Chest:   clear  CV:   Regular rate and rhythm without murmur  Incisions:  Healing nicely  Abdomen:  Soft and nontender  Extremities:  Warm and well-perfused with no lower extremity edema  Diagnostic Tests:  CHEST 2 VIEW  COMPARISON: PA and lateral chest x-ray of September 22, 2014  FINDINGS: The left lung is well-expanded and clear. On the right there is persistent subsegmental atelectasis. There is a small pleural effusion which has decreased in volume since the previous study. The cardiac silhouette is mildly enlarged. The pulmonary vascularity is normal. There is tortuosity of the descending thoracic aorta. The bony thorax is unremarkable.  IMPRESSION: Resolution of pulmonary interstitial edema and decreased size of the right pleural effusion since the previous study. There is persistent subsegmental atelectasis at the right lung base.   Electronically Signed  By: David Martinique M.D.  On: 10/04/2014 11:47    Impression:  Patient appears to be doing  very well just 2 weeks status post minimally invasive mitral valve repair. He appears euvolemic on exam and chest x-ray is clear.  He does not have a murmur on physical exam.   Plan:  I have encouraged the patient to continue to gradually increase his physical activity as tolerated with his primary limitation remaining that he refrain from heavy lifting or strenuous use of his arms or shoulders.  I think he may resume driving an automobile.  I have reminded the patient that it is anticipated that his exercise tolerance will be slow to recover and some  degree of exertional shortness of breath is expected. Per the patient's request, I have given him a refill for Lasix at a reduced dose, although I do not feel the patient appears to be fluid overloaded at this time and I have cautioned him that it is best to avoid dehydration.  The patient is scheduled to follow-up with Dr. Harl Bowie next week. He will return to our office for follow-up in 3 weeks.   Valentina Gu. Roxy Manns, MD 10/04/2014 1:13 PM

## 2014-10-04 NOTE — Patient Instructions (Addendum)
You may continue to gradually increase your physical activity as tolerated.  Refrain from any heavy lifting or strenuous use of your arms and shoulders until at least 8 weeks from the time of your surgery, and avoid activities that cause increased pain in your chest on the side of your surgical incision.  Otherwise you may continue to increase activities without any particular limitations.  Increase the intensity and duration of physical activity gradually.  You may return to driving an automobile as long as you are no longer requiring oral narcotic pain relievers during the daytime.  It would be wise to start driving only short distances during the daylight and gradually increase from there as you feel comfortable.  Take 1 lasix and 1 potassium tablet as needed for fluid overload.  Continue to monitor your weight on a daily basis.

## 2014-10-07 ENCOUNTER — Ambulatory Visit: Payer: Medicare Other | Admitting: Thoracic Surgery (Cardiothoracic Vascular Surgery)

## 2014-10-08 ENCOUNTER — Ambulatory Visit (INDEPENDENT_AMBULATORY_CARE_PROVIDER_SITE_OTHER): Payer: Medicare Other | Admitting: *Deleted

## 2014-10-08 ENCOUNTER — Encounter: Payer: Self-pay | Admitting: Cardiology

## 2014-10-08 ENCOUNTER — Ambulatory Visit (INDEPENDENT_AMBULATORY_CARE_PROVIDER_SITE_OTHER): Payer: Medicare Other | Admitting: Cardiology

## 2014-10-08 VITALS — BP 113/73 | HR 76 | Ht 67.0 in | Wt 180.0 lb

## 2014-10-08 DIAGNOSIS — Z9889 Other specified postprocedural states: Secondary | ICD-10-CM

## 2014-10-08 DIAGNOSIS — I340 Nonrheumatic mitral (valve) insufficiency: Secondary | ICD-10-CM | POA: Diagnosis not present

## 2014-10-08 DIAGNOSIS — R5383 Other fatigue: Secondary | ICD-10-CM | POA: Diagnosis not present

## 2014-10-08 LAB — POCT INR: INR: 2.1

## 2014-10-08 NOTE — Progress Notes (Signed)
Patient ID: Jonathan Church, male   DOB: [birth year], 69 y.o.   MRN: [id number]     Clinical Summary Jonathan Church is a 69 y.o.male seen today for follow up of the following medical problems.  1. Mitral regurgitation - history of severe MR, he is s/p MV repair by Dr Roxy Manns 08/2014. Seen in outpatient f/u 10/04/14 and was found to be doing well - notes some mild discomfort in chest, nonspecific fullness that comes on with certain positions - has had some generalized fatigue. No significant SOB or DOE.  - notes some orthostatic symptoms.  - he is not interested in cardiac rehab    Past Medical History  Diagnosis Date  . Sleep apnea     wears CPAP  . Heart murmur   . Head injury, closed   . Shortness of breath dyspnea     with exertion  . Asthma     years ago  . Hemorrhoids   . Severe mitral regurgitation 05/16/2014  . S/P minimally invasive mitral valve repair 09/18/2014    Complex valvuloplasty including triangular resection of flail segment of posterior leaflet, artificial Gore-tex neochord placement x4 and 32 mm Sorin Memo 3D Rechord ring annuloplasty via right mini thoracotomy approach     Allergies  Allergen Reactions  . Tape Rash    Rash from tape wrapped around arm after giving blood     Current Outpatient Prescriptions  Medication Sig Dispense Refill  . aspirin EC 81 MG EC tablet Take 1 tablet (81 mg total) by mouth daily.    . furosemide (LASIX) 20 MG tablet Take 1 tablet (20 mg total) by mouth daily. 30 tablet 0  . metoprolol tartrate (LOPRESSOR) 25 MG tablet Take 0.5 tablets (12.5 mg total) by mouth 2 (two) times daily. 30 tablet 1  . potassium chloride SA (K-DUR,KLOR-CON) 20 MEQ tablet Take 1 tablet (20 mEq total) by mouth daily. With food 30 tablet 0  . warfarin (COUMADIN) 2.5 MG tablet Take 1 tablet (2.5 mg total) by mouth daily at 6 PM. 30 tablet 1   No current facility-administered medications for this visit.     Past Surgical History  Procedure Laterality  Date  . Tee without cardioversion N/A 06/11/2014    Procedure: TRANSESOPHAGEAL ECHOCARDIOGRAM (TEE);  Surgeon: Arnoldo Lenis, MD;  [facility];  Service: Cardiology;  Laterality: N/A;  . Cardiac catheterization N/A 06/18/2014    Procedure: Right/Left Heart Cath and Coronary Angiography;  Surgeon: Jettie Booze, MD;  Location: [place] [facility];  Service: Cardiovascular;  Laterality: N/A;  . Nasal septrum  1970's  . Colonoscopy    . Mitral valve repair Right 09/18/2014    Procedure: MINIMALLY INVASIVE MITRAL VALVE REPAIR (MVR);  Surgeon: [name], MD;  Location: [place] [facility];  Service: Open Heart Surgery;  Laterality: Right;  . Tee without cardioversion N/A 09/18/2014    Procedure: TRANSESOPHAGEAL ECHOCARDIOGRAM (TEE);  Surgeon: [name], MD;  Location: [place];  Service: Open Heart Surgery;  Laterality: N/A;     Allergies  Allergen Reactions  . Tape Rash    Rash from tape wrapped around arm after giving blood      No family history on file.   Social History Jonathan Church reports that he has never smoked. He has never used smokeless tobacco. Jonathan Church reports that he does not drink alcohol.   Review of Systems CONSTITUTIONAL: No weight loss, fever, chills, weakness or fatigue.  HEENT: Eyes: No visual loss, blurred  vision, double vision or yellow sclerae.No hearing loss, sneezing, congestion, runny nose or sore throat.  SKIN: No rash or itching.  CARDIOVASCULAR: per HPI RESPIRATORY: No shortness of breath, cough or sputum.  GASTROINTESTINAL: No anorexia, nausea, vomiting or diarrhea. No abdominal pain or blood.  GENITOURINARY: No burning on urination, no polyuria NEUROLOGICAL: orthostatic dizziness MUSCULOSKELETAL: No muscle, back pain, joint pain or stiffness.  LYMPHATICS: No enlarged nodes. No history of splenectomy.  PSYCHIATRIC: No history of depression or anxiety.  ENDOCRINOLOGIC: No reports of sweating, cold or heat intolerance. No polyuria or  polydipsia.  Marland Kitchen   Physical Examination Filed Vitals:   10/08/14 1500  BP: 113/73  Pulse: 76   Filed Vitals:   10/08/14 1500  Height: 5\' 7"  (1.702 m)  Weight: 180 lb (81.647 kg)    Gen: resting comfortably, no acute distress HEENT: no scleral icterus, pupils equal round and reactive, no palptable cervical adenopathy,  CV: RRR, no m/r/g, no JVD Resp: Clear to auscultation bilaterally GI: abdomen is soft, non-tender, non-distended, normal bowel sounds, no hepatosplenomegaly MSK: extremities are warm, no edema.  Skin: warm, no rash Neuro:  no focal deficits Psych: appropriate affect   Diagnostic Studies 04/2014 echo Study Conclusions  - Left ventricle: The cavity size was normal. Systolic function was normal. The estimated ejection fraction was in the range of 60% to 65%. Wall motion was normal; there were no regional wall motion abnormalities. 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 Suggest TEE and consideration for repair if clinically indicated. - Left atrium: The atrium was mildly dilated. - Atrial septum: No defect or patent foramen ovale was identified.  05/2014 TEE Study Conclusions  - Left ventricle: The cavity size was normal. Wall thickness was normal. Systolic function was normal. The estimated ejection fraction was in the range of 60% to 65%. - Aortic valve: No evidence of vegetation. - Mitral valve: The prolapsing portion of the posterior leaflet creates two seperate jets.This is best seen in image 23 with TEE probe at 110 degree angle. There is a very eccentric anterior jet that runs along the atrial wall and is difficult to quantify. There is a more centrally directed jet with a vena contracta of 0.4 cm. There was severe regurgitation. - Left atrium: The atrium was dilated. No evidence of thrombus in the appendage. - Right ventricle: The cavity size was mildly dilated. - Right  atrium: No evidence of thrombus in the atrial cavity or appendage. - Atrial septum: No defect or patent foramen ovale was identified. Echo contrast study showed no right-to-left atrial level shunt, following an increase in RA pressure induced by provocative maneuvers. - Tricuspid valve: There was moderate regurgitation. The TR vena contracta is 0.4 cm.  Impressions:  - Severe eccentric MR. Eccentricity limits quantification, however regurgitation appears to be severe.   05/2014 Cath  No significant coronary artery disease.  Mild pulmonary artery hypertension.  Prominent V waves noted on wedge tracing. Mildly elevated LVEDP.  Pulmonary artery saturation 65%. Aortic saturation 96%. Cardiac output 4.2 L/m. Cardiac index 2.1.  Further plans per Dr. Harl Bowie regarding mitral valve repair. The patient will be aggressively hydrated due to some mild renal insufficiency. Cardiology f/u with Dr. Harl Bowie.    Assessment and Plan  1. Mitral regurgitation - s/p mitral valve repair - overall doing well since surgery. Describes some genealized fatigue and orthostatic dizziness I suspect is related to his lopressor which was started at discharge. We will discontinue lorpessor and follow his  symptoms - continue coumadin and ASA per CT surgery, would anticipate at least 3 months of coumadin   F/u 3 months      Arnoldo Lenis, M.D

## 2014-10-08 NOTE — Patient Instructions (Signed)
Your physician recommends that you schedule a follow-up appointment in: 3 months with Dr. Harl Bowie  Your physician has recommended you make the following change in your medication:   STOP METOPROLOL   Thank you for choosing [facility]!!

## 2014-10-21 ENCOUNTER — Ambulatory Visit (INDEPENDENT_AMBULATORY_CARE_PROVIDER_SITE_OTHER): Payer: Self-pay | Admitting: Thoracic Surgery (Cardiothoracic Vascular Surgery)

## 2014-10-21 ENCOUNTER — Other Ambulatory Visit: Payer: Self-pay | Admitting: *Deleted

## 2014-10-21 ENCOUNTER — Encounter: Payer: Self-pay | Admitting: Thoracic Surgery (Cardiothoracic Vascular Surgery)

## 2014-10-21 ENCOUNTER — Encounter (HOSPITAL_COMMUNITY): Payer: Self-pay | Admitting: Emergency Medicine

## 2014-10-21 VITALS — BP 110/84 | HR 83 | Resp 16 | Ht 67.5 in | Wt 170.0 lb

## 2014-10-21 DIAGNOSIS — Z87828 Personal history of other (healed) physical injury and trauma: Secondary | ICD-10-CM | POA: Diagnosis not present

## 2014-10-21 DIAGNOSIS — G4730 Sleep apnea, unspecified: Secondary | ICD-10-CM | POA: Insufficient documentation

## 2014-10-21 DIAGNOSIS — Z9981 Dependence on supplemental oxygen: Secondary | ICD-10-CM | POA: Insufficient documentation

## 2014-10-21 DIAGNOSIS — Z7901 Long term (current) use of anticoagulants: Secondary | ICD-10-CM | POA: Diagnosis not present

## 2014-10-21 DIAGNOSIS — J45909 Unspecified asthma, uncomplicated: Secondary | ICD-10-CM | POA: Insufficient documentation

## 2014-10-21 DIAGNOSIS — R011 Cardiac murmur, unspecified: Secondary | ICD-10-CM | POA: Insufficient documentation

## 2014-10-21 DIAGNOSIS — Z9889 Other specified postprocedural states: Secondary | ICD-10-CM

## 2014-10-21 DIAGNOSIS — I340 Nonrheumatic mitral (valve) insufficiency: Secondary | ICD-10-CM

## 2014-10-21 DIAGNOSIS — Z7982 Long term (current) use of aspirin: Secondary | ICD-10-CM | POA: Diagnosis not present

## 2014-10-21 DIAGNOSIS — K8020 Calculus of gallbladder without cholecystitis without obstruction: Secondary | ICD-10-CM | POA: Diagnosis not present

## 2014-10-21 DIAGNOSIS — R1010 Upper abdominal pain, unspecified: Secondary | ICD-10-CM | POA: Diagnosis present

## 2014-10-21 NOTE — Patient Instructions (Addendum)
You may continue to gradually increase your physical activity as tolerated.  Refrain from any heavy lifting or strenuous use of your arms and shoulders until at least 8 weeks from the time of your surgery, and avoid activities that cause increased pain in your chest on the side of your surgical incision.  Otherwise you may continue to increase activities without any particular limitations.  Increase the intensity and duration of physical activity gradually.  Continue all previous medications without any changes at this time

## 2014-10-21 NOTE — Progress Notes (Signed)
      [address]       [place] [address]             [phone]     CARDIOTHORACIC SURGERY OFFICE NOTE  Referring Provider is Arnoldo Lenis, MD PCP is London Pepper, MD   HPI:  Patient returns for follow-up status post minimally invasive mitral valve repair on 09/18/2014 for mitral valve prolapse with severe symptom at mitral regurgitation. He was last seen here in our office on 10/04/2014. At that time he was complaining that his exercise tolerance was not improving and he requested a refill of Lasix because of concerns that he may have significant fluid retention. Brief course of Lasix did not improve his symptoms. He was seen in follow-up by Dr. Harl Bowie on 10/08/2014 and metoprolol was discontinued at that time. He now returns to our office for further follow-up. He notes that his resting pulse is noticeably higher, typically ranging between 90 and 100 bpm. He complaints that he still does not have the exercise tolerance that he thought he should experience at this point. However, he denies any exertional shortness of breath. He states that he just gets tired quickly. He has not had resting shortness of breath, PND, orthopnea, or lower extremity edema. He has minimal residual soreness in his chest. He has not had any dizzy spells, palpitations, or syncope.   Current Outpatient Prescriptions  Medication Sig Dispense Refill  . aspirin EC 81 MG EC tablet Take 1 tablet (81 mg total) by mouth daily.    Marland Kitchen warfarin (COUMADIN) 2.5 MG tablet Take 1 tablet (2.5 mg total) by mouth daily at 6 PM. 30 tablet 1   No current facility-administered medications for this visit.      Physical Exam:   BP 110/84 mmHg  Pulse 83  Resp 16  Ht 5' 7.5" (1.715 m)  Wt 170 lb (77.111 kg)  BMI 26.22 kg/m2  SpO2 93%  General:  Well-appearing  Chest:   clear  CV:   Regular rate and rhythm without murmur  Incisions:  Healing nicely  Abdomen:  Soft and nontender  Extremities:  Warm  and well-perfused  Diagnostic Tests:  n/a   Impression:  Patient appears to be doing very well approximately one month status post minimally invasive mitral valve repair. The patient's resting pulse is increased since metoprolol was stopped. He does not have a murmur on physical exam.  Overall clinically he looks quite well, although he complains that he still gets tired fairly easily.  Plan:  We will obtain routine follow-up echocardiogram to reassess the patient's mitral valve repair and left ventricular systolic function. I've explained to the patient the possibility that it is not unusual to develop some degree of mild mitral stenosis and/or significant drop in his left ventricular ejection fraction following mitral valve repair.  I have encouraged patient to continue gradually increase his physical activity and suggested that participation in the cardiac rehabilitation program might be of benefit. All of his questions have been answered. He will return in 4 weeks to review the results of his echo. Depending on findings we may consider adding an ACE inhibitor if his blood pressure will tolerate.  He does not wish to try adding an ACE inhibitor at this time.    Valentina Gu. Roxy Manns, MD 10/21/2014 11:42 AM

## 2014-10-21 NOTE — ED Notes (Signed)
Pt. reports generalized abdominal pain onset this evening , denies nausea or emesis , feels like " gas" , no fever or chills.

## 2014-10-22 ENCOUNTER — Emergency Department (HOSPITAL_COMMUNITY): Payer: Medicare Other

## 2014-10-22 ENCOUNTER — Emergency Department (HOSPITAL_COMMUNITY)
Admission: EM | Admit: 2014-10-22 | Discharge: 2014-10-22 | Disposition: A | Discharge status: Home / Self Care | Payer: Medicare Other | Attending: Emergency Medicine | Admitting: Emergency Medicine

## 2014-10-22 DIAGNOSIS — R1010 Upper abdominal pain, unspecified: Secondary | ICD-10-CM

## 2014-10-22 DIAGNOSIS — K8020 Calculus of gallbladder without cholecystitis without obstruction: Secondary | ICD-10-CM

## 2014-10-22 LAB — COMPREHENSIVE METABOLIC PANEL
ALK PHOS: 110 U/L (ref 38–126)
ALT: 13 U/L — ABNORMAL LOW (ref 17–63)
ANION GAP: 9 (ref 5–15)
AST: 25 U/L (ref 15–41)
Albumin: 2.9 g/dL — ABNORMAL LOW (ref 3.5–5.0)
BUN: 18 mg/dL (ref 6–20)
CALCIUM: 8.6 mg/dL — AB (ref 8.9–10.3)
CO2: 27 mmol/L (ref 22–32)
Chloride: 101 mmol/L (ref 101–111)
Creatinine, Ser: 1.74 mg/dL — ABNORMAL HIGH (ref 0.61–1.24)
GFR, EST AFRICAN AMERICAN: 45 mL/min — AB (ref >60)
GFR, EST NON AFRICAN AMERICAN: 39 mL/min — AB (ref >60)
GLUCOSE: 136 mg/dL — AB (ref 65–99)
POTASSIUM: 4.2 mmol/L (ref 3.5–5.1)
Sodium: 137 mmol/L (ref 135–145)
TOTAL PROTEIN: 7.8 g/dL (ref 6.5–8.1)
Total Bilirubin: 0.9 mg/dL (ref 0.3–1.2)

## 2014-10-22 LAB — CBC
HCT: 37.6 % — ABNORMAL LOW (ref 39.0–52.0)
HEMOGLOBIN: 12.2 g/dL — AB (ref 13.0–17.0)
MCH: 31.3 pg (ref 26.0–34.0)
MCHC: 32.4 g/dL (ref 30.0–36.0)
MCV: 96.4 fL (ref 78.0–100.0)
Platelets: 304 10*3/uL (ref 150–400)
RBC: 3.9 MIL/uL — AB (ref 4.22–5.81)
RDW: 12.5 % (ref 11.5–15.5)
WBC: 11 10*3/uL — AB (ref 4.0–10.5)

## 2014-10-22 LAB — I-STAT TROPONIN, ED: TROPONIN I, POC: 0 ng/mL (ref 0.00–0.08)

## 2014-10-22 LAB — LIPASE, BLOOD: Lipase: 28 U/L (ref 22–51)

## 2014-10-22 MED ORDER — ONDANSETRON HCL 4 MG/2ML IJ SOLN
4.0000 mg | Freq: Once | INTRAMUSCULAR | Status: AC
  Administered 2014-10-22: 4 mg via INTRAVENOUS
  Filled 2014-10-22: qty 2

## 2014-10-22 MED ORDER — HYDROMORPHONE HCL 1 MG/ML IJ SOLN
1.0000 mg | Freq: Once | INTRAMUSCULAR | Status: AC
  Administered 2014-10-22: 1 mg via INTRAVENOUS
  Filled 2014-10-22: qty 1

## 2014-10-22 MED ORDER — IOHEXOL 300 MG/ML  SOLN
80.0000 mL | Freq: Once | INTRAMUSCULAR | Status: AC | PRN
  Administered 2014-10-22: 100 mL via INTRAVENOUS

## 2014-10-22 MED ORDER — ONDANSETRON 8 MG PO TBDP: 8.0000 mg | ORAL_TABLET | Freq: Three times a day (TID) | ORAL | Status: DC | PRN

## 2014-10-22 NOTE — ED Notes (Signed)
Dr. Campos at bedside   

## 2014-10-22 NOTE — ED Provider Notes (Signed)
CSN: [id number]     Arrival date & time 10/21/14  2334 History  By signing my name below, I, Jonathan Church, attest that this documentation has been prepared under the direction and in the presence of Jonathan Schmidt, MD. Electronically Signed: Irene Church, ED Scribe. 10/22/2014. 1:13 AM.   Chief Complaint  Patient presents with  . Abdominal Pain   The history is provided by the patient. No language interpreter was used.  HPI Comments: Jonathan Church is a 69 y.o. male who wears CPAP who presents to the Emergency Department complaining of sudden onset, constant, generalized abdominal pain onset 3 hours ago. Pt states that it "feels like gas," but denies hx of GERD. Currently rates pain 6/10. Pt states that he had mitral valve repair on 09/18/14 and wife states that he has had trouble with his stomach since the surgery. Pt states that he has tried to make himself throw up to see if it would make him feel better. States he has been passing gas and has burped a few times. Last BM was this morning. Pt states that he has been having intermittent fevers since the surgery, last one being last night tmax 100 F, but currently does not have a fever. Denies nausea, emesis, chest pain, SOB, or chills. Denies hx of abdominal surgery, gastric surgery, drinking daily or hx of smoking.   Past Medical History  Diagnosis Date  . Sleep apnea     wears CPAP  . Heart murmur   . Head injury, closed   . Shortness of breath dyspnea     with exertion  . Asthma     years ago  . Hemorrhoids   . Severe mitral regurgitation 05/16/2014  . S/P minimally invasive mitral valve repair 09/18/2014    Complex valvuloplasty including triangular resection of flail segment of posterior leaflet, artificial Gore-tex neochord placement x4 and 32 mm Sorin Memo 3D Rechord ring annuloplasty via right mini thoracotomy approach   Past Surgical History  Procedure Laterality Date  . Tee without cardioversion N/A 06/11/2014    Procedure:  TRANSESOPHAGEAL ECHOCARDIOGRAM (TEE);  Surgeon: Arnoldo Lenis, MD;  [facility];  Service: Cardiology;  Laterality: N/A;  . Cardiac catheterization N/A 06/18/2014    Procedure: Right/Left Heart Cath and Coronary Angiography;  Surgeon: Jettie Booze, MD;  Location: [place] [facility];  Service: Cardiovascular;  Laterality: N/A;  . Nasal septrum  1970's  . Colonoscopy    . Mitral valve repair Right 09/18/2014    Procedure: MINIMALLY INVASIVE MITRAL VALVE REPAIR (MVR);  Surgeon: [name], MD;  Location: [place];  Service: Open Heart Surgery;  Laterality: Right;  . Tee without cardioversion N/A 09/18/2014    Procedure: TRANSESOPHAGEAL ECHOCARDIOGRAM (TEE);  Surgeon: [name], MD;  Location: [place];  Service: Open Heart Surgery;  Laterality: N/A;   Family History  Problem Relation Age of Onset  . Heart failure Father   . Heart failure Paternal Grandmother    Social History  Substance Use Topics  . Smoking status: Never Smoker   . Smokeless tobacco: Never Used  . Alcohol Use: No    Review of Systems  Constitutional: Negative for fever and chills.  Respiratory: Negative for shortness of breath.   Cardiovascular: Negative for chest pain.  Gastrointestinal: Positive for abdominal pain. Negative for nausea and vomiting.  All other systems reviewed and are negative.  Allergies  Tape  Home Medications   Prior to Admission medications   Medication Sig  Start Date End Date Taking? Authorizing Provider  aspirin EC 81 MG EC tablet Take 1 tablet (81 mg total) by mouth daily. 09/22/14   Donielle Liston Alba, PA-C  warfarin (COUMADIN) 2.5 MG tablet Take 1 tablet (2.5 mg total) by mouth daily at 6 PM. 09/22/14   Donielle Liston Alba, PA-C   BP 116/71 mmHg  Pulse 89  Temp(Src) 98.8 F (37.1 C) (Oral)  Resp 16  SpO2 96%  Physical Exam  Constitutional: He is oriented to person, place, and time. He appears well-developed and well-nourished.  HENT:  Head:  Normocephalic and atraumatic.  Eyes: EOM are normal.  Neck: Normal range of motion.  Cardiovascular: Normal rate, regular rhythm, normal heart sounds and intact distal pulses.   Pulmonary/Chest: Effort normal and breath sounds normal. No respiratory distress.  Abdominal: Soft. He exhibits no distension. There is tenderness in the epigastric area.  Mild epigastric tenderness  Musculoskeletal: Normal range of motion.  Neurological: He is alert and oriented to person, place, and time.  Skin: Skin is warm and dry.  Psychiatric: He has a normal mood and affect. Judgment normal.  Nursing note and vitals reviewed.   ED Course  Procedures (including critical care time) DIAGNOSTIC STUDIES: Oxygen Saturation is 96% on RA, normal by my interpretation.    COORDINATION OF CARE: .   Labs Review Labs Reviewed  COMPREHENSIVE METABOLIC PANEL - Abnormal; Notable for the following:    Glucose, Bld 136 (*)    Creatinine, Ser 1.74 (*)    Calcium 8.6 (*)    Albumin 2.9 (*)    ALT 13 (*)    GFR calc non Af Amer 39 (*)    GFR calc Af Amer 45 (*)    All other components within normal limits  CBC - Abnormal; Notable for the following:    WBC 11.0 (*)    RBC 3.90 (*)    Hemoglobin 12.2 (*)    HCT 37.6 (*)    All other components within normal limits  LIPASE, BLOOD  I-STAT TROPOININ, ED   Imaging Review Ct Abdomen Pelvis W Contrast  10/22/2014   CLINICAL DATA:  Severe acute onset upper abdominal pain with nausea.  EXAM: CT ABDOMEN AND PELVIS WITH CONTRAST  TECHNIQUE: Multidetector CT imaging of the abdomen and pelvis was performed using the standard protocol following bolus administration of intravenous contrast.  CONTRAST:  140mL OMNIPAQUE IOHEXOL 300 MG/ML  SOLN  COMPARISON:  07/02/2014  FINDINGS: Lower chest and abdominal wall: Interval mitral valve annuloplasty with changes from cardiopulmonary bypass. There is a moderate pericardial effusion which is water density and without notable serosal  enhancement. Small left pleural effusion and mild bibasilar atelectasis.  Hepatobiliary: No focal liver abnormality.Cholelithiasis with stone in the gallbladder neck. The gallbladder is full but there is no inflammatory changes to suggest acute cholecystitis.  Pancreas: Unremarkable.  Spleen: Unremarkable.  Adrenals/Urinary Tract: Negative adrenals. No hydronephrosis or stone. 3 cm right renal cyst. Unremarkable bladder.  Reproductive:Enlarged prostate, projecting into the bladder.  Stomach/Bowel:  No obstruction. No appendicitis.  Vascular/Lymphatic: Interval surgery in the right groin, over the common femoral artery and vein, with 37 mm diameter nonenhancing fluid over these vessels. No early venous opacification. No mass or adenopathy.  Peritoneal: No ascites or pneumoperitoneum.  Musculoskeletal: No acute abnormalities. Spondylosis with bulky spurring at L4-5 and L5-S1. Incidental T9 body hemangioma.  IMPRESSION: 1. Moderate pericardial and small left pleural effusions with bibasilar atelectasis. 2. No acute intra-abdominal finding. 3. Stone in the gallbladder neck.  No indication of acute cholecystitis. 4. Right groin surgery with 37 mm seroma or thrombosed pseudoaneurysm.   Electronically Signed   By: Monte Fantasia M.D.   On: 10/22/2014 04:16   Dg Abd Acute W/chest  10/22/2014   CLINICAL DATA:  Upper abdominal pain and nausea. Status post mitral valve repair 1 month ago.  EXAM: DG ABDOMEN ACUTE W/ 1V CHEST  COMPARISON:  Chest radiograph October 04, 2014 and CT chest, abdomen and pelvis July 02, 2014  FINDINGS: The cardiac silhouette is upper limits of normal in size. Mediastinal silhouette is nonsuspicious. Status post cardiac valve replacement. RIGHT lung base strandy densities. Similarly elevated RIGHT hemidiaphragm. Scarring LEFT lung base. No pleural effusion focal consolidation.  Bowel gas pattern is nondilated and nonobstructive. Moderate amount of retained large bowel stool. No intra-abdominal mass  effect, scratch or free air. Small lamellated calcification projects and RIGHT upper quadrant. Soft tissue planes and included osseous structures are non-suspicious. Phleboliths project in the pelvis.  IMPRESSION: Borderline cardiomegaly.  RIGHT lung base atelectasis.  Moderate amount of retained large bowel stool without bowel obstruction.  Gallstone.   Electronically Signed   By: Elon Alas M.D.   On: 10/22/2014 01:10  I personally reviewed the imaging tests through PACS system I reviewed available ER/hospitalization records through the EMR    EKG Interpretation   Date/Time:  Monday October 21 2014 23:36:02 EDT Ventricular Rate:  99 PR Interval:  178 QRS Duration: 150 QT Interval:  396 QTC Calculation: 508 R Axis:   -73 Text Interpretation:  Normal sinus rhythm Possible Left atrial enlargement  Left axis deviation Right bundle branch block Inferior infarct , age  undetermined Abnormal ECG No significant change was found Confirmed by  Nasim Habeeb  MD, Lennette Bihari ([id number]) on 10/22/2014 4:00:34 AM      MDM   Final diagnoses:  Pain of upper abdomen  Gallstone    5:13 AM Patient feels much better this time.  No right upper quadrant abdominal pain.  Discharge home in good condition.  Outpatient general surgery follow-up.  LFTs and lipase are normal.  Patient understands to return the emergency department for new or worsening symptoms.  He understands to return for fever, increasing pain, discoloration of the skin (jaundice)  I personally performed the services described in this documentation, which was scribed in my presence. The recorded information has been reviewed and is accurate.      Jonathan Schmidt, MD 10/22/14 [phone]

## 2014-10-22 NOTE — ED Notes (Signed)
Patient transported to CT 

## 2014-10-22 NOTE — Discharge Instructions (Signed)
Biliary Colic  °Biliary colic is a steady or irregular pain in the upper abdomen. It is usually under the right side of the rib cage. It happens when gallstones interfere with the normal flow of bile from the gallbladder. Bile is a liquid that helps to digest fats. Bile is made in the liver and stored in the gallbladder. When you eat a meal, bile passes from the gallbladder through the cystic duct and the common bile duct into the small intestine. There, it mixes with partially digested food. If a gallstone blocks either of these ducts, the normal flow of bile is blocked. The muscle cells in the bile duct contract forcefully to try to move the stone. This causes the pain of biliary colic.  °SYMPTOMS  °· A person with biliary colic usually complains of pain in the upper abdomen. This pain can be: °¨ In the center of the upper abdomen just below the breastbone. °¨ In the upper-right part of the abdomen, near the gallbladder and liver. °¨ Spread back toward the right shoulder blade. °· Nausea and vomiting. °· The pain usually occurs after eating. °· Biliary colic is usually triggered by the digestive system's demand for bile. The demand for bile is high after fatty meals. Symptoms can also occur when a person who has been fasting suddenly eats a very large meal. Most episodes of biliary colic pass after 1 to 5 hours. After the most intense pain passes, your abdomen may continue to ache mildly for about 24 hours. °DIAGNOSIS  °After you describe your symptoms, your caregiver will perform a physical exam. He or she will pay attention to the upper right portion of your belly (abdomen). This is the area of your liver and gallbladder. An ultrasound will help your caregiver look for gallstones. Specialized scans of the gallbladder may also be done. Blood tests may be done, especially if you have fever or if your pain persists. °PREVENTION  °Biliary colic can be prevented by controlling the risk factors for gallstones. Some of  these risk factors, such as heredity, increasing age, and pregnancy are a normal part of life. Obesity and a high-fat diet are risk factors you can change through a healthy lifestyle. Women going through menopause who take hormone replacement therapy (estrogen) are also more likely to develop biliary colic. °TREATMENT  °· Pain medication may be prescribed. °· You may be encouraged to eat a fat-free diet. °· If the first episode of biliary colic is severe, or episodes of colic keep retuning, surgery to remove the gallbladder (cholecystectomy) is usually recommended. This procedure can be done through small incisions using an instrument called a laparoscope. The procedure often requires a brief stay in the hospital. Some people can leave the hospital the same day. It is the most widely used treatment in people troubled by painful gallstones. It is effective and safe, with no complications in more than 90% of cases. °· If surgery cannot be done, medication that dissolves gallstones may be used. This medication is expensive and can take months or years to work. Only small stones will dissolve. °· Rarely, medication to dissolve gallstones is combined with a procedure called shock-wave lithotripsy. This procedure uses carefully aimed shock waves to break up gallstones. In many people treated with this procedure, gallstones form again within a few years. °PROGNOSIS  °If gallstones block your cystic duct or common bile duct, you are at risk for repeated episodes of biliary colic. There is also a 25% chance that you will develop   a gallbladder infection(acute cholecystitis), or some other complication of gallstones within 10 to 20 years. If you have surgery, schedule it at a time that is convenient for you and at a time when you are not sick. °HOME CARE INSTRUCTIONS  °· Drink plenty of clear fluids. °· Avoid fatty, greasy or fried foods, or any foods that make your pain worse. °· Take medications as directed. °SEEK MEDICAL  CARE IF:  °· You develop a fever over 100.5° F (38.1° C). °· Your pain gets worse over time. °· You develop nausea that prevents you from eating and drinking. °· You develop vomiting. °SEEK IMMEDIATE MEDICAL CARE IF:  °· You have continuous or severe belly (abdominal) pain which is not relieved with medications. °· You develop nausea and vomiting which is not relieved with medications. °· You have symptoms of biliary colic and you suddenly develop a fever and shaking chills. This may signal cholecystitis. Call your caregiver immediately. °· You develop a yellow color to your skin or the white part of your eyes (jaundice). °Document Released: 06/14/2005 Document Revised: 04/05/2011 Document Reviewed: 08/24/2007 °[employer]® Patient Information ©2015 [employer]. This information is not intended to replace advice given to you by your health care provider. Make sure you discuss any questions you have with your health care provider. ° °

## 2014-10-23 ENCOUNTER — Ambulatory Visit (INDEPENDENT_AMBULATORY_CARE_PROVIDER_SITE_OTHER): Payer: Medicare Other | Admitting: *Deleted

## 2014-10-23 DIAGNOSIS — Z9889 Other specified postprocedural states: Secondary | ICD-10-CM

## 2014-10-23 DIAGNOSIS — I340 Nonrheumatic mitral (valve) insufficiency: Secondary | ICD-10-CM

## 2014-10-23 LAB — POCT INR: INR: 1.4

## 2014-10-29 ENCOUNTER — Ambulatory Visit (INDEPENDENT_AMBULATORY_CARE_PROVIDER_SITE_OTHER): Payer: Medicare Other | Admitting: *Deleted

## 2014-10-29 ENCOUNTER — Ambulatory Visit (HOSPITAL_COMMUNITY)
Admission: RE | Admit: 2014-10-29 | Discharge: 2014-10-29 | Disposition: A | Discharge status: Home / Self Care | Payer: Medicare Other | Source: Ambulatory Visit | Attending: Thoracic Surgery (Cardiothoracic Vascular Surgery) | Admitting: Thoracic Surgery (Cardiothoracic Vascular Surgery)

## 2014-10-29 DIAGNOSIS — I371 Nonrheumatic pulmonary valve insufficiency: Secondary | ICD-10-CM | POA: Diagnosis not present

## 2014-10-29 DIAGNOSIS — I340 Nonrheumatic mitral (valve) insufficiency: Secondary | ICD-10-CM

## 2014-10-29 DIAGNOSIS — I77810 Thoracic aortic ectasia: Secondary | ICD-10-CM | POA: Diagnosis not present

## 2014-10-29 DIAGNOSIS — I071 Rheumatic tricuspid insufficiency: Secondary | ICD-10-CM | POA: Insufficient documentation

## 2014-10-29 DIAGNOSIS — Z9889 Other specified postprocedural states: Secondary | ICD-10-CM | POA: Diagnosis not present

## 2014-10-29 LAB — POCT INR: INR: 1.7

## 2014-10-29 NOTE — Progress Notes (Signed)
  Echocardiogram 2D Echocardiogram has been performed.  Jonathan Church 10/29/2014, 11:12 AM

## 2014-11-07 ENCOUNTER — Telehealth: Payer: Self-pay | Admitting: *Deleted

## 2014-11-07 ENCOUNTER — Ambulatory Visit (INDEPENDENT_AMBULATORY_CARE_PROVIDER_SITE_OTHER): Payer: Medicare Other | Admitting: *Deleted

## 2014-11-07 DIAGNOSIS — I340 Nonrheumatic mitral (valve) insufficiency: Secondary | ICD-10-CM | POA: Diagnosis not present

## 2014-11-07 DIAGNOSIS — Z9889 Other specified postprocedural states: Secondary | ICD-10-CM

## 2014-11-07 LAB — POCT INR: INR: 1.6

## 2014-11-07 MED ORDER — WARFARIN SODIUM 2.5 MG PO TABS: 2.5000 mg | ORAL_TABLET | ORAL | Status: DC

## 2014-11-07 NOTE — Telephone Encounter (Signed)
ERRONEOUS ENCOUNTER

## 2014-11-18 ENCOUNTER — Ambulatory Visit (INDEPENDENT_AMBULATORY_CARE_PROVIDER_SITE_OTHER): Payer: Self-pay | Admitting: Thoracic Surgery (Cardiothoracic Vascular Surgery)

## 2014-11-18 ENCOUNTER — Encounter: Payer: Self-pay | Admitting: Thoracic Surgery (Cardiothoracic Vascular Surgery)

## 2014-11-18 ENCOUNTER — Ambulatory Visit (INDEPENDENT_AMBULATORY_CARE_PROVIDER_SITE_OTHER): Payer: Medicare Other

## 2014-11-18 VITALS — BP 130/84 | HR 84 | Resp 16 | Ht 67.5 in | Wt 173.2 lb

## 2014-11-18 DIAGNOSIS — Z9889 Other specified postprocedural states: Secondary | ICD-10-CM | POA: Diagnosis not present

## 2014-11-18 DIAGNOSIS — I340 Nonrheumatic mitral (valve) insufficiency: Secondary | ICD-10-CM

## 2014-11-18 DIAGNOSIS — T8189XA Other complications of procedures, not elsewhere classified, initial encounter: Secondary | ICD-10-CM

## 2014-11-18 DIAGNOSIS — I898 Other specified noninfective disorders of lymphatic vessels and lymph nodes: Secondary | ICD-10-CM | POA: Diagnosis present

## 2014-11-18 HISTORY — DX: Other specified noninfective disorders of lymphatic vessels and lymph nodes: I89.8

## 2014-11-18 HISTORY — DX: Other complications of procedures, not elsewhere classified, initial encounter: T81.89XA

## 2014-11-18 LAB — POCT INR: INR: 1.6

## 2014-11-18 NOTE — Patient Instructions (Addendum)
Patient may stop taking Coumadin (warfarin) in 1 month and resume taking aspirin daily.  You may resume unrestricted physical activity without any particular limitations at this time.   Endocarditis is a potentially serious infection of heart valves or inside lining of the heart.  It occurs more commonly in patients with diseased heart valves (such as patient's with aortic or mitral valve disease) and in patients who have undergone heart valve repair or replacement.  Certain surgical and dental procedures may put you at risk, such as dental cleaning, other dental procedures, or any surgery involving the respiratory, urinary, gastrointestinal tract, gallbladder or prostate gland.   To minimize your chances for develooping endocarditis, maintain good oral health and seek prompt medical attention for any infections involving the mouth, teeth, gums, skin or urinary tract.    Always notify your doctor or dentist about your underlying heart valve condition before having any invasive procedures. You will need to take antibiotics before certain procedures, including all routine dental cleanings or other dental procedures.  Your cardiologist or dentist should prescribe these antibiotics for you to be taken ahead of time.

## 2014-11-18 NOTE — Progress Notes (Signed)
[address]       [place], [address]             [phone]     CARDIOTHORACIC SURGERY OFFICE NOTE  Referring Provider is Arnoldo Lenis, MD PCP is London Pepper, MD   HPI:  The patient returns for follow-up status post minimally invasive mitral valve repair on 09/18/2014 for mitral valve prolapse with severe symptomatic mitral regurgitation. He was last seen here in our office on 10/21/2014 at which time he was doing well. Since then he underwent routine follow-up echocardiogram. He returns to our office for follow-up today. He states that he continues to feel better every day. His exercise tolerance is gradually improving. He has had no other significant problems or complaints. He has not had any problems with Coumadin therapy.   Current Outpatient Prescriptions  Medication Sig Dispense Refill  . aspirin EC 81 MG EC tablet Take 1 tablet (81 mg total) by mouth daily.    . ondansetron (ZOFRAN ODT) 8 MG disintegrating tablet Take 1 tablet (8 mg total) by mouth every 8 (eight) hours as needed for nausea or vomiting. 12 tablet 0  . warfarin (COUMADIN) 2.5 MG tablet Take 1 tablet (2.5 mg total) by mouth as directed. 45 tablet 1   No current facility-administered medications for this visit.      Physical Exam:   BP 130/84 mmHg  Pulse 84  Resp 16  Ht 5' 7.5" (1.715 m)  Wt 78.563 kg (173 lb 3.2 oz)  BMI 26.71 kg/m2  SpO2 96%  General:  Well-appearing  Chest:   Clear  CV:   Regular rate and rhythm without murmur  Incisions:  Healing nicely. Patient has soft tissue swelling in the right groin consistent with a small lymphocele  Abdomen:  Soft and nontender  Extremities:  Warm and well-perfused  Diagnostic Tests:  Transthoracic Echocardiography  Patient:  Jonathan Church, Jonathan Church MR #:    [id number] Study Date: 10/29/2014 Gender:   M Age:    70 Height:   170.2 cm Weight:   77.1 kg BSA:    1.92 m^2 Pt. Status: Room:  ATTENDING   Darylene Price, M.D. ORDERING   Darylene Price, M.D. REFERRING  Darylene Price, M.D. SONOGRAPHER Donata Clay PERFORMING  [facility], Outpatient  cc:  ------------------------------------------------------------------- LV EF: 50% -  55%  ------------------------------------------------------------------- History:  PMH: Fatigue. Sleep disturbance. S/P mitral valve repair. Mitral valve insufficiency 424.0/134.  ------------------------------------------------------------------- Study Conclusions  - Left ventricle: The cavity size was normal. Systolic function was normal. The estimated ejection fraction was in the range of 50% to 55%. Wall motion was normal; there were no regional wall motion abnormalities. There was an increased relative contribution of atrial contraction to ventricular filling. Doppler parameters are consistent with abnormal left ventricular relaxation (grade 1 diastolic dysfunction). Doppler parameters are consistent with high ventricular filling pressure. - Ventricular septum: Septal motion showed paradox. These changes are consistent with intraventricular conduction delay. - Aorta: Aortic root dimension: 38 mm (ED). 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 - Mitral valve: S/P MV repair with annuloplasty ring. There was trivial regurgitation. - Tricuspid valve: There was mild regurgitation. - Pulmonic valve: There was trivial regurgitation. - Pulmonary arteries: PA peak pressure: 32 mm Hg (S).  Transthoracic echocardiography. M-mode, complete 2D, spectral Doppler, and color Doppler. Birthdate: Patient birthdate: 1945/09/17. Age: Patient is 69 yr old. Sex: Gender: male. BMI: 26.6 kg/m^2. Blood pressure:   110/84 Patient status: Inpatient. Study  date: Study date: 10/29/2014. Study time:  10:20 AM.  -------------------------------------------------------------------  ------------------------------------------------------------------- Left ventricle: The cavity size was normal. Systolic function was normal. The estimated ejection fraction was in the range of 50% to 55%. Wall motion was normal; there were no regional wall motion abnormalities. There was an increased relative contribution of atrial contraction to ventricular filling. Doppler parameters are consistent with abnormal left ventricular relaxation (grade 1 diastolic dysfunction). Doppler parameters are consistent with high ventricular filling pressure.  ------------------------------------------------------------------- Aortic valve:  Trileaflet; normal thickness leaflets. Mobility was not restricted. Doppler: Transvalvular velocity was within the normal range. There was no stenosis. There was no regurgitation.  ------------------------------------------------------------------- Aorta: Aortic root: The aortic root was mildly dilated.  ------------------------------------------------------------------- Mitral valve: S/P MV repair with annuloplasty ring. Structurally normal valve.  Mobility was not restricted. Doppler: Transvalvular velocity was within the normal range. There was no evidence for stenosis. There was trivial regurgitation.  Mean gradient (D): 4 mm Hg. Peak gradient (D): 6 mm Hg.  ------------------------------------------------------------------- Left atrium: The atrium was normal in size.  ------------------------------------------------------------------- Right ventricle: The cavity size was normal. Wall thickness was normal. Systolic function was normal.  ------------------------------------------------------------------- Ventricular septum:  Septal motion showed paradox. These changes are consistent with intraventricular conduction  delay.  ------------------------------------------------------------------- Pulmonic valve:  Structurally normal valve.  Cusp separation was normal. Doppler: Transvalvular velocity was within the normal range. There was no evidence for stenosis. There was trivial regurgitation.  ------------------------------------------------------------------- Tricuspid valve:  Structurally normal valve.  Doppler: Transvalvular velocity was within the normal range. There was mild regurgitation.  ------------------------------------------------------------------- Pulmonary artery:  The main pulmonary artery was normal-sized. Systolic pressure was within the normal range.  ------------------------------------------------------------------- Right atrium: The atrium was normal in size.  ------------------------------------------------------------------- Pericardium: There was no pericardial effusion.  ------------------------------------------------------------------- Systemic veins: Inferior vena cava: The vessel was normal in size.  ------------------------------------------------------------------- Measurements  Left ventricle              Value    Reference LV ID, ED, PLAX chordal         47  mm   43 - 52 LV ID, ES, PLAX chordal         34.4 mm   23 - 38 LV fx shortening, PLAX chordal  (L)   27  %   >=29 LV PW thickness, ED           10  mm   --------- IVS/LV PW ratio, ED           0.82     <=1.3 LV ejection fraction, 1-p A4C      51  %   --------- LV end-diastolic volume, 2-p       135  ml   --------- LV end-systolic volume, 2-p       62  ml   --------- LV ejection fraction, 2-p        54  %   --------- Stroke volume, 2-p            73  ml   --------- LV end-diastolic volume/bsa, 2-p     70  ml/m^2 --------- LV  end-systolic volume/bsa, 2-p     32  ml/m^2 --------- Stroke volume/bsa, 2-p          38  ml/m^2 --------- LV e&', lateral              5.77 cm/s  --------- LV E/e&', lateral             20.62    ---------  LV e&', medial              6.42 cm/s  --------- LV E/e&', medial             18.54    --------- LV e&', average              6.1  cm/s  --------- LV E/e&', average             19.52    ---------  Ventricular septum            Value    Reference IVS thickness, ED            8.24 mm   ---------  LVOT                   Value    Reference LVOT ID, S                22  mm   --------- LVOT area                3.8  cm^2  ---------  Aorta                  Value    Reference Aortic root ID, ED            38  mm   ---------  Left atrium               Value    Reference LA ID, A-P, ES              29  mm   --------- LA ID/bsa, A-P              1.51 cm/m^2 <=2.2 LA volume, S               52.4 ml   --------- LA volume/bsa, S             27.2 ml/m^2 --------- LA volume, ES, 1-p A4C          47.8 ml   --------- LA volume/bsa, ES, 1-p A4C        24.8 ml/m^2 --------- LA volume, ES, 1-p A2C          55.4 ml   --------- LA volume/bsa, ES, 1-p A2C        28.8 ml/m^2 ---------  Mitral valve               Value    Reference Mitral E-wave peak velocity       119  cm/s  --------- Mitral A-wave peak velocity       159  cm/s  --------- Mitral mean velocity, D         93.3 cm/s  --------- Mitral deceleration time     (H)   254  ms   150 - 230 Mitral mean gradient,  D         4   mm Hg --------- Mitral peak gradient, D         6   mm Hg --------- Mitral E/A ratio, peak          0.7     --------- Mitral annulus VTI, D          52.4 cm   ---------  Pulmonary arteries            Value    Reference PA pressure, S, DP        (H)  32  mm Hg <=30  Tricuspid valve             Value    Reference Tricuspid regurg peak velocity      267  cm/s  --------- Tricuspid peak RV-RA gradient      29  mm Hg ---------  Systemic veins              Value    Reference Estimated CVP              3   mm Hg ---------  Right ventricle             Value    Reference TAPSE                  12.9 mm   --------- RV pressure, S, DP        (H)   32  mm Hg <=30 RV s&', lateral, S            8.27 cm/s  ---------  Legend: (L) and (H) mark values outside specified reference range.  ------------------------------------------------------------------- Prepared and Electronically Authenticated by  Fransico Him, MD 2016-10-04T16:51:00   Impression:  Patient is doing well approximately 2 months status post minimally invasive mitral valve repair. Follow-up echocardiogram looks good with intact mitral valve repair and preserved left ventricular systolic function.  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    Plan:  I've encouraged patient to continue to gradually increase his activity as tolerated without any particular limitations. I've recommended that he remain on Coumadin for another month. He is eager to stop all medications if possible. I suggested that he should take an aspirin every day once he stops taking Coumadin. He has been reminded regarding the lifelong need for antibiotic  prophylaxis for all dental cleanings and related procedures. We will plan to see him back for routine follow-up neck September, approximately 1 year following his original surgery. During the interim he will call and return to see [place] as needed.   Valentina Gu. Roxy Manns, MD 11/18/2014 1:50 PM

## 2014-12-02 ENCOUNTER — Ambulatory Visit (INDEPENDENT_AMBULATORY_CARE_PROVIDER_SITE_OTHER): Payer: Medicare Other | Admitting: *Deleted

## 2014-12-02 DIAGNOSIS — I340 Nonrheumatic mitral (valve) insufficiency: Secondary | ICD-10-CM

## 2014-12-02 DIAGNOSIS — Z9889 Other specified postprocedural states: Secondary | ICD-10-CM

## 2014-12-02 LAB — POCT INR: INR: 1.7

## 2014-12-02 MED ORDER — WARFARIN SODIUM 2.5 MG PO TABS: 2.5000 mg | ORAL_TABLET | ORAL | Status: DC

## 2014-12-12 ENCOUNTER — Ambulatory Visit (INDEPENDENT_AMBULATORY_CARE_PROVIDER_SITE_OTHER): Payer: Medicare Other | Admitting: Pharmacist

## 2014-12-12 DIAGNOSIS — Z9889 Other specified postprocedural states: Secondary | ICD-10-CM | POA: Diagnosis not present

## 2014-12-12 DIAGNOSIS — I340 Nonrheumatic mitral (valve) insufficiency: Secondary | ICD-10-CM | POA: Diagnosis not present

## 2014-12-12 LAB — POCT INR: INR: 1.5

## 2014-12-21 ENCOUNTER — Inpatient Hospital Stay (HOSPITAL_COMMUNITY)
Admission: EM | Admit: 2014-12-21 | Discharge: 2014-12-26 | DRG: 857 | Disposition: A | Discharge status: Home Health | Payer: Medicare Other | Attending: Internal Medicine | Admitting: Internal Medicine

## 2014-12-21 ENCOUNTER — Emergency Department (HOSPITAL_COMMUNITY): Payer: Medicare Other

## 2014-12-21 ENCOUNTER — Encounter (HOSPITAL_COMMUNITY): Payer: Self-pay | Admitting: *Deleted

## 2014-12-21 DIAGNOSIS — N183 Chronic kidney disease, stage 3 (moderate): Secondary | ICD-10-CM | POA: Diagnosis present

## 2014-12-21 DIAGNOSIS — T814XXA Infection following a procedure, initial encounter: Principal | ICD-10-CM | POA: Diagnosis present

## 2014-12-21 DIAGNOSIS — R7303 Prediabetes: Secondary | ICD-10-CM | POA: Diagnosis present

## 2014-12-21 DIAGNOSIS — R509 Fever, unspecified: Secondary | ICD-10-CM | POA: Diagnosis not present

## 2014-12-21 DIAGNOSIS — Z9889 Other specified postprocedural states: Secondary | ICD-10-CM

## 2014-12-21 DIAGNOSIS — I898 Other specified noninfective disorders of lymphatic vessels and lymph nodes: Secondary | ICD-10-CM | POA: Diagnosis present

## 2014-12-21 DIAGNOSIS — R1909 Other intra-abdominal and pelvic swelling, mass and lump: Secondary | ICD-10-CM

## 2014-12-21 DIAGNOSIS — K5900 Constipation, unspecified: Secondary | ICD-10-CM | POA: Diagnosis present

## 2014-12-21 DIAGNOSIS — N179 Acute kidney failure, unspecified: Secondary | ICD-10-CM | POA: Diagnosis not present

## 2014-12-21 DIAGNOSIS — Y838 Other surgical procedures as the cause of abnormal reaction of the patient, or of later complication, without mention of misadventure at the time of the procedure: Secondary | ICD-10-CM | POA: Diagnosis present

## 2014-12-21 DIAGNOSIS — G4733 Obstructive sleep apnea (adult) (pediatric): Secondary | ICD-10-CM | POA: Diagnosis present

## 2014-12-21 DIAGNOSIS — J449 Chronic obstructive pulmonary disease, unspecified: Secondary | ICD-10-CM | POA: Diagnosis present

## 2014-12-21 DIAGNOSIS — Z7982 Long term (current) use of aspirin: Secondary | ICD-10-CM

## 2014-12-21 DIAGNOSIS — Z8249 Family history of ischemic heart disease and other diseases of the circulatory system: Secondary | ICD-10-CM

## 2014-12-21 HISTORY — DX: Other specified noninfective disorders of lymphatic vessels and lymph nodes: I89.8

## 2014-12-21 LAB — CBC WITH DIFFERENTIAL/PLATELET
Basophils Absolute: 0.1 10*3/uL (ref 0.0–0.1)
Basophils Relative: 0 %
Eosinophils Absolute: 0 10*3/uL (ref 0.0–0.7)
Eosinophils Relative: 0 %
HCT: 43.7 % (ref 39.0–52.0)
Hemoglobin: 14.5 g/dL (ref 13.0–17.0)
Lymphocytes Relative: 9 %
Lymphs Abs: 1.5 10*3/uL (ref 0.7–4.0)
MCH: 31.9 pg (ref 26.0–34.0)
MCHC: 33.2 g/dL (ref 30.0–36.0)
MCV: 96 fL (ref 78.0–100.0)
Monocytes Absolute: 2.2 10*3/uL — ABNORMAL HIGH (ref 0.1–1.0)
Monocytes Relative: 13 %
Neutro Abs: 12.6 10*3/uL — ABNORMAL HIGH (ref 1.7–7.7)
Neutrophils Relative %: 78 %
Platelets: 199 10*3/uL (ref 150–400)
RBC: 4.55 MIL/uL (ref 4.22–5.81)
RDW: 13.1 % (ref 11.5–15.5)
WBC: 16.4 10*3/uL — ABNORMAL HIGH (ref 4.0–10.5)

## 2014-12-21 LAB — I-STAT CG4 LACTIC ACID, ED: LACTIC ACID, VENOUS: 1.53 mmol/L (ref 0.5–2.0)

## 2014-12-21 LAB — URINALYSIS, ROUTINE W REFLEX MICROSCOPIC
Glucose, UA: NEGATIVE mg/dL
Hgb urine dipstick: NEGATIVE
KETONES UR: 40 mg/dL — AB
LEUKOCYTES UA: NEGATIVE
NITRITE: NEGATIVE
PH: 6 (ref 5.0–8.0)
Protein, ur: NEGATIVE mg/dL
Specific Gravity, Urine: 1.023 (ref 1.005–1.030)

## 2014-12-21 LAB — BASIC METABOLIC PANEL
Anion gap: 9 (ref 5–15)
BUN: 18 mg/dL (ref 6–20)
CO2: 27 mmol/L (ref 22–32)
Calcium: 8.8 mg/dL — ABNORMAL LOW (ref 8.9–10.3)
Chloride: 100 mmol/L — ABNORMAL LOW (ref 101–111)
Creatinine, Ser: 1.63 mg/dL — ABNORMAL HIGH (ref 0.61–1.24)
GFR calc Af Amer: 48 mL/min — ABNORMAL LOW (ref >60)
GFR calc non Af Amer: 42 mL/min — ABNORMAL LOW (ref >60)
Glucose, Bld: 102 mg/dL — ABNORMAL HIGH (ref 65–99)
Potassium: 4.3 mmol/L (ref 3.5–5.1)
Sodium: 136 mmol/L (ref 135–145)

## 2014-12-21 LAB — PROTIME-INR
INR: 1.18 (ref 0.00–1.49)
Prothrombin Time: 15.2 seconds (ref 11.6–15.2)

## 2014-12-21 MED ORDER — SODIUM CHLORIDE 0.9 % IV BOLUS (SEPSIS)
1000.0000 mL | Freq: Once | INTRAVENOUS | Status: AC
  Administered 2014-12-21: 1000 mL via INTRAVENOUS

## 2014-12-21 NOTE — ED Provider Notes (Signed)
CSN: [id number]     Arrival date & time 12/21/14  1916 History   First MD Initiated Contact with Patient 12/21/14 1946     Chief Complaint  Patient presents with  . Fever     (Consider location/radiation/quality/duration/timing/severity/associated sxs/prior Treatment) HPI patient is a 69 year old male with past medical history significant for MR s/p minimally invasive mitral valve repair (09/18/14 through R groin), who presents to the emergency department with fevers. Reports a 4 day history of fever, Tmax 102. Reports generalized fatigue and weakness. Denies headache, cough, congestion, rhinorrhea, chest pain, shortness of breath, nausea, vomiting, diarrhea, dysuria, abdominal pain. Reports that he has had a "lump" at his right groin since the mitral valve repair, states that it was decreasing in size until yesterday. Reports that it is now back to the same size as immediately following surgery. Denies history of hernias. Denies radiation into his scrotum.  Past Medical History  Diagnosis Date  . Sleep apnea     wears CPAP  . Heart murmur   . Head injury, closed   . Shortness of breath dyspnea     with exertion  . Asthma     years ago  . Hemorrhoids   . Severe mitral regurgitation 05/16/2014  . S/P minimally invasive mitral valve repair 09/18/2014    Complex valvuloplasty including triangular resection of flail segment of posterior leaflet, artificial Gore-tex neochord placement x4 and 32 mm Sorin Memo 3D Rechord ring annuloplasty via right mini thoracotomy approach   Past Surgical History  Procedure Laterality Date  . Tee without cardioversion N/A 06/11/2014    Procedure: TRANSESOPHAGEAL ECHOCARDIOGRAM (TEE);  Surgeon: Arnoldo Lenis, MD;  [facility];  Service: Cardiology;  Laterality: N/A;  . Cardiac catheterization N/A 06/18/2014    Procedure: Right/Left Heart Cath and Coronary Angiography;  Surgeon: Jettie Booze, MD;  Location: [place] [facility];  Service:  Cardiovascular;  Laterality: N/A;  . Nasal septrum  1970's  . Colonoscopy    . Mitral valve repair Right 09/18/2014    Procedure: MINIMALLY INVASIVE MITRAL VALVE REPAIR (MVR);  Surgeon: [name], MD;  Location: [place];  Service: Open Heart Surgery;  Laterality: Right;  . Tee without cardioversion N/A 09/18/2014    Procedure: TRANSESOPHAGEAL ECHOCARDIOGRAM (TEE);  Surgeon: [name], MD;  Location: [place];  Service: Open Heart Surgery;  Laterality: N/A;   Family History  Problem Relation Age of Onset  . Heart failure Father   . Heart failure Paternal Grandmother    Social History  Substance Use Topics  . Smoking status: Never Smoker   . Smokeless tobacco: Never Used  . Alcohol Use: No    Review of Systems  Constitutional: Positive for fatigue. Negative for appetite change.  HENT: Negative for trouble swallowing.   Eyes: Negative for visual disturbance.  Respiratory: Negative for chest tightness and shortness of breath.   Cardiovascular: Negative for palpitations.  Gastrointestinal: Negative for abdominal pain and blood in stool.  Genitourinary: Negative for hematuria, flank pain, decreased urine volume, scrotal swelling and testicular pain.  Musculoskeletal: Negative for back pain and joint swelling.  Neurological: Negative for dizziness, seizures, weakness and light-headedness.  Psychiatric/Behavioral: Negative for behavioral problems.      Allergies  Peanut-containing drug products and Tape  Home Medications   Prior to Admission medications   Medication Sig Start Date End Date Taking? Authorizing Provider  acetaminophen (TYLENOL) 500 MG tablet Take 1,000 mg by mouth every 6 (six) hours as needed for  fever.   Yes Historical Provider, MD  aspirin EC 81 MG EC tablet Take 1 tablet (81 mg total) by mouth daily. Patient taking differently: Take 81 mg by mouth at bedtime.  09/22/14  Yes Donielle Liston Alba, PA-C  OVER THE COUNTER MEDICATION Take 80,000-240,000 Units by  mouth 3 (three) times a week. Serra peptase (1 capsule 80,000 units)  - protein destroying enzymes for inflammation and cholesterol   Yes Historical Provider, MD  ondansetron (ZOFRAN ODT) 8 MG disintegrating tablet Take 1 tablet (8 mg total) by mouth every 8 (eight) hours as needed for nausea or vomiting. Patient not taking: Reported on 12/21/2014 10/22/14   [name], MD   BP 113/74 mmHg  Pulse 114  Temp(Src) 99.4 F (37.4 C) (Oral)  Resp 18  Ht 5\' 8"  (1.727 m)  Wt 74.435 kg  BMI 24.96 kg/m2  SpO2 93% Physical Exam  Constitutional: He is oriented to person, place, and time. He appears well-developed and well-nourished.  HENT:  Head: Normocephalic and atraumatic.  Mouth/Throat: Oropharynx is clear and moist.  Eyes: Conjunctivae and EOM are normal. Pupils are equal, round, and reactive to light.  Neck: Normal range of motion. No JVD present. No tracheal deviation present.  Cardiovascular: Normal rate, regular rhythm, normal heart sounds and intact distal pulses.   Pulmonary/Chest: Effort normal and breath sounds normal. No respiratory distress. He has no wheezes. He exhibits no tenderness.  Abdominal: Soft. He exhibits no distension. There is no tenderness. There is no rigidity, no rebound, no guarding, no CVA tenderness, no tenderness at McBurney's point and negative Murphy's sign. No hernia.    Musculoskeletal: Normal range of motion.  Neurological: He is alert and oriented to person, place, and time.  Skin: Skin is warm. No rash noted.  Psychiatric: He has a normal mood and affect.  Nursing note and vitals reviewed.   ED Course  Procedures (including critical care time) Labs Review Labs Reviewed  CBC WITH DIFFERENTIAL/PLATELET - Abnormal; Notable for the following:    WBC 16.4 (*)    Neutro Abs 12.6 (*)    Monocytes Absolute 2.2 (*)    All other components within normal limits  BASIC METABOLIC PANEL - Abnormal; Notable for the following:    Chloride 100 (*)    Glucose,  Bld 102 (*)    Creatinine, Ser 1.63 (*)    Calcium 8.8 (*)    GFR calc non Af Amer 42 (*)    GFR calc Af Amer 48 (*)    All other components within normal limits  URINALYSIS, ROUTINE W REFLEX MICROSCOPIC (NOT AT [facility]) - Abnormal; Notable for the following:    Bilirubin Urine SMALL (*)    Ketones, ur 40 (*)    All other components within normal limits  BASIC METABOLIC PANEL - Abnormal; Notable for the following:    Glucose, Bld 113 (*)    Creatinine, Ser 1.67 (*)    Calcium 8.4 (*)    GFR calc non Af Amer 40 (*)    GFR calc Af Amer 47 (*)    All other components within normal limits  CBC - Abnormal; Notable for the following:    WBC 18.6 (*)    All other components within normal limits  CULTURE, BLOOD (ROUTINE X 2)  CULTURE, BLOOD (ROUTINE X 2)  PROTIME-INR  INFLUENZA PANEL BY PCR (TYPE A & B, H1N1)  I-STAT CG4 LACTIC ACID, ED  I-STAT CG4 LACTIC ACID, ED    Imaging Review Dg Chest 2  View  12/21/2014  CLINICAL DATA:  Chills and fever starting 2 days ago. Swelling to the right groin. EXAM: CHEST  2 VIEW COMPARISON:  10/22/2014 FINDINGS: Mild cardiac enlargement without vascular congestion. Probable cardiac valve prosthesis. Elevation of the right hemidiaphragm with atelectasis in the right lung base. Appearance is similar to previous study. No developing consolidation or edema. No blunting of costophrenic angles. No pneumothorax. Degenerative changes in the spine. IMPRESSION: Elevation of right hemidiaphragm with atelectasis in the right lung base similar to prior study. Electronically Signed   By: Lucienne Capers M.D.   On: 12/21/2014 20:29   US Pelvis Limited  12/21/2014  CLINICAL DATA:  Chronic right inguinal swelling since surgery in August 2016, acutely worsened. Fever and chills for 2 days. Initial encounter. EXAM: LIMITED ULTRASOUND OF PELVIS TECHNIQUE: Limited transabdominal ultrasound examination of the pelvis was performed. COMPARISON:  CT of the abdomen and pelvis from  10/22/2014 FINDINGS: There is a mildly complex collection of fluid and debris at the right inguinal region, measuring 4.7 x 4.4 x 3.5 cm. No associated blood flow is seen on limited Doppler evaluation, and this is not directly adjacent to a blood vessel. This may reflect a postoperative seroma. There is no evidence for pseudoaneurysm. IMPRESSION: Mildly complex collection of fluid and debris noted at the right inguinal region, measuring 4.7 cm, likely reflecting a postoperative seroma. No associated blood flow seen. Electronically Signed   By: Garald Balding M.D.   On: 12/21/2014 23:27   I have personally reviewed and evaluated these images and lab results as part of my medical decision-making.   EKG Interpretation   Date/Time:  Saturday December 21 2014 19:46:02 EST Ventricular Rate:  110 PR Interval:  160 QRS Duration: 148 QT Interval:  366 QTC Calculation: 495 R Axis:   -81 Text Interpretation:  Sinus tachycardia Left axis deviation Right bundle  branch block Abnormal ECG Aside from tachycardia, no significant change  when compared to prior EKG Confirmed by LIU MD, DANA [phone]) on 12/21/2014  7:57:53 PM      MDM   Final diagnoses:  Groin swelling   Patient is a 69 year old male with past medical history significant for COPD, recent mitral valve repair, who presents to the emergency department with a four-day history of fever, chills, fatigue, generalized weakness. On arrival no acute distress, not ill appearing. Tmax prior to arrival, 102. No fever on arrival, but tachycardic to the 110s, hemodynamically stable, SpO2 > 92% on RA. No respiratory distress.  Benign abdominal exam. No signs of rashes. No sacral ulcers appreciated.  Basic labs, CXR, UA obtained to evaluate for source of infection. Found to have a leukocytosis of 16.4. No lactic acidosis. Influenza negative. CXR showed no signs of pneumonia. UA showed no signs of urinary tract infection. Blood cultures obtained.  Given the  patient's recent mitral valve replacement and non-localizing infectious symptoms, concerns for endocarditis vs bacteremia.   Patient also complaining of right groin mass. Presents following mitral valve replacement, acutely increased in size. We will obtain an ultrasound soft tissue as an inpatient. Differential diagnosis includes pseudoaneurysm, seroma, lymphocele, hematoma, abscess. No overlying erythema, warmth, purulent drainage, doubt that this is the source of infection.  Patient admitted to medicine for further infectious workup. Patient's vital signs remained hemodynamically stable but tachycardic. Patient stable for the floor. Transferred to the floor in stable condition. Do not feel that antibiotics is necessary at this time.    Nathaniel Man, MD 12/22/14 [id number]  Forde Dandy,  MD 12/22/14 1554

## 2014-12-21 NOTE — ED Notes (Signed)
Patient presents stating 2 days ago he got a chill and then noticed a fever.  States he has swelling to the right groin  Had heart surgery in August  Denies nausea

## 2014-12-22 ENCOUNTER — Encounter (HOSPITAL_COMMUNITY): Payer: Self-pay | Admitting: General Practice

## 2014-12-22 DIAGNOSIS — T814XXA Infection following a procedure, initial encounter: Secondary | ICD-10-CM | POA: Diagnosis present

## 2014-12-22 DIAGNOSIS — K5900 Constipation, unspecified: Secondary | ICD-10-CM | POA: Diagnosis present

## 2014-12-22 DIAGNOSIS — N179 Acute kidney failure, unspecified: Secondary | ICD-10-CM | POA: Diagnosis not present

## 2014-12-22 DIAGNOSIS — R7303 Prediabetes: Secondary | ICD-10-CM | POA: Diagnosis present

## 2014-12-22 DIAGNOSIS — Z8249 Family history of ischemic heart disease and other diseases of the circulatory system: Secondary | ICD-10-CM | POA: Diagnosis not present

## 2014-12-22 DIAGNOSIS — I361 Nonrheumatic tricuspid (valve) insufficiency: Secondary | ICD-10-CM | POA: Diagnosis not present

## 2014-12-22 DIAGNOSIS — G4733 Obstructive sleep apnea (adult) (pediatric): Secondary | ICD-10-CM | POA: Diagnosis present

## 2014-12-22 DIAGNOSIS — J449 Chronic obstructive pulmonary disease, unspecified: Secondary | ICD-10-CM | POA: Diagnosis present

## 2014-12-22 DIAGNOSIS — Z7982 Long term (current) use of aspirin: Secondary | ICD-10-CM | POA: Diagnosis not present

## 2014-12-22 DIAGNOSIS — R509 Fever, unspecified: Secondary | ICD-10-CM | POA: Diagnosis present

## 2014-12-22 DIAGNOSIS — R1900 Intra-abdominal and pelvic swelling, mass and lump, unspecified site: Secondary | ICD-10-CM | POA: Diagnosis not present

## 2014-12-22 DIAGNOSIS — I898 Other specified noninfective disorders of lymphatic vessels and lymph nodes: Secondary | ICD-10-CM | POA: Diagnosis present

## 2014-12-22 DIAGNOSIS — N183 Chronic kidney disease, stage 3 (moderate): Secondary | ICD-10-CM | POA: Diagnosis present

## 2014-12-22 DIAGNOSIS — R1909 Other intra-abdominal and pelvic swelling, mass and lump: Secondary | ICD-10-CM | POA: Insufficient documentation

## 2014-12-22 DIAGNOSIS — Z9889 Other specified postprocedural states: Secondary | ICD-10-CM | POA: Diagnosis not present

## 2014-12-22 DIAGNOSIS — Y838 Other surgical procedures as the cause of abnormal reaction of the patient, or of later complication, without mention of misadventure at the time of the procedure: Secondary | ICD-10-CM | POA: Diagnosis present

## 2014-12-22 DIAGNOSIS — I9789 Other postprocedural complications and disorders of the circulatory system, not elsewhere classified: Secondary | ICD-10-CM | POA: Diagnosis not present

## 2014-12-22 LAB — BASIC METABOLIC PANEL
Anion gap: 8 (ref 5–15)
BUN: 18 mg/dL (ref 6–20)
CHLORIDE: 101 mmol/L (ref 101–111)
CO2: 26 mmol/L (ref 22–32)
CREATININE: 1.67 mg/dL — AB (ref 0.61–1.24)
Calcium: 8.4 mg/dL — ABNORMAL LOW (ref 8.9–10.3)
GFR calc Af Amer: 47 mL/min — ABNORMAL LOW (ref >60)
GFR calc non Af Amer: 40 mL/min — ABNORMAL LOW (ref >60)
Glucose, Bld: 113 mg/dL — ABNORMAL HIGH (ref 65–99)
Potassium: 4.5 mmol/L (ref 3.5–5.1)
SODIUM: 135 mmol/L (ref 135–145)

## 2014-12-22 LAB — CBC
HCT: 41.6 % (ref 39.0–52.0)
Hemoglobin: 13.7 g/dL (ref 13.0–17.0)
MCH: 31.6 pg (ref 26.0–34.0)
MCHC: 32.9 g/dL (ref 30.0–36.0)
MCV: 96.1 fL (ref 78.0–100.0)
PLATELETS: 208 10*3/uL (ref 150–400)
RBC: 4.33 MIL/uL (ref 4.22–5.81)
RDW: 13 % (ref 11.5–15.5)
WBC: 18.6 10*3/uL — AB (ref 4.0–10.5)

## 2014-12-22 LAB — INFLUENZA PANEL BY PCR (TYPE A & B)
H1N1FLUPCR: NOT DETECTED
INFLBPCR: NEGATIVE
Influenza A By PCR: NEGATIVE

## 2014-12-22 LAB — I-STAT CG4 LACTIC ACID, ED: Lactic Acid, Venous: 0.94 mmol/L (ref 0.5–2.0)

## 2014-12-22 MED ORDER — SENNOSIDES-DOCUSATE SODIUM 8.6-50 MG PO TABS: 1.0000 | ORAL_TABLET | Freq: Every evening | ORAL | Status: DC | PRN

## 2014-12-22 MED ORDER — LIDOCAINE HCL (PF) 1 % IJ SOLN
10.0000 mL | Freq: Once | INTRAMUSCULAR | Status: AC
  Administered 2014-12-22: 10 mL
  Filled 2014-12-22: qty 30

## 2014-12-22 MED ORDER — VANCOMYCIN HCL 10 G IV SOLR
1250.0000 mg | INTRAVENOUS | Status: DC
  Administered 2014-12-22 – 2014-12-24 (×4): 1250 mg via INTRAVENOUS
  Filled 2014-12-22 (×3): qty 1250

## 2014-12-22 MED ORDER — PIPERACILLIN-TAZOBACTAM 3.375 G IVPB
3.3750 g | Freq: Three times a day (TID) | INTRAVENOUS | Status: DC
  Administered 2014-12-22 – 2014-12-25 (×10): 3.375 g via INTRAVENOUS
  Filled 2014-12-22 (×12): qty 50

## 2014-12-22 MED ORDER — SODIUM CHLORIDE 0.9 % IV SOLN
INTRAVENOUS | Status: AC
  Administered 2014-12-22: 04:00:00 via INTRAVENOUS

## 2014-12-22 MED ORDER — ENOXAPARIN SODIUM 40 MG/0.4ML ~~LOC~~ SOLN
40.0000 mg | SUBCUTANEOUS | Status: DC
  Filled 2014-12-22: qty 0.4

## 2014-12-22 MED ORDER — PROMETHAZINE HCL 25 MG PO TABS: 12.5000 mg | ORAL_TABLET | Freq: Four times a day (QID) | ORAL | Status: DC | PRN

## 2014-12-22 MED ORDER — ASPIRIN EC 81 MG PO TBEC
81.0000 mg | DELAYED_RELEASE_TABLET | Freq: Every day | ORAL | Status: DC
  Administered 2014-12-22 – 2014-12-24 (×3): 81 mg via ORAL
  Filled 2014-12-22 (×5): qty 1

## 2014-12-22 MED ORDER — OXYCODONE HCL 5 MG PO TABS: 5.0000 mg | ORAL_TABLET | ORAL | Status: DC | PRN

## 2014-12-22 NOTE — Consult Note (Signed)
[address]       Jonathan Church, [id number]             [phone]        [name] Medical Record [id number] Date of Birth: 26-Mar-1945  Referring: Jonathan Church Primary Care: Jonathan Pepper, MD  Chief Complaint:    Chief Complaint  Patient presents with  . Fever    History of Present Illness:      Jonathan Church is a 69 yo white male well known to [facility].  He is S/P Minimally Invasive Mitral Valve repair performed on 09/18/2014.  He done well post operatively and at his last visit with Dr. 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  No intervention was needed at that time.  However, the patient presented to the ED today with complaints of several day history of fevers, chills, fevers as high as 102, and enlargement of his right groin region.  He has no other symptoms and denies chest pain, shortness of breath, cough, and lower extremity edema.  CXR obtained did not show evidence of pneumonia.  An Ultrasound was performed on his right groin which confirmed the presence of a 4.7 cm fluid collection felt to be seroma.  There was no blood flow to this area.  Currently, the patient is stable in his room without significant complaints.  Current Activity/ Functional Status: Patient is independent with mobility/ambulation, transfers, ADL's, IADL's.            Past Medical History  Diagnosis Date  . Sleep apnea     wears CPAP  . Heart murmur   . Head injury, closed   . Shortness of breath dyspnea     with exertion  . Asthma     years ago  . Hemorrhoids   . Severe mitral regurgitation 05/16/2014  . S/P minimally invasive mitral valve repair 09/18/2014    Complex valvuloplasty including triangular resection of flail segment of posterior leaflet, artificial Gore-tex neochord placement x4 and 32 mm Sorin Memo 3D Rechord ring annuloplasty via right mini thoracotomy approach    Past Surgical History  Procedure Laterality Date  .  Tee without cardioversion N/A 06/11/2014    Procedure: TRANSESOPHAGEAL ECHOCARDIOGRAM (TEE);  Surgeon: Jonathan Lenis, MD;  [facility];  Service: Cardiology;  Laterality: N/A;  . Cardiac catheterization N/A 06/18/2014    Procedure: Right/Left Heart Cath and Coronary Angiography;  Surgeon: Jonathan Booze, MD;  Location: [place] [facility];  Service: Cardiovascular;  Laterality: N/A;  . Nasal septrum  1970's  . Colonoscopy    . Mitral valve repair Right 09/18/2014    Procedure: MINIMALLY INVASIVE MITRAL VALVE REPAIR (MVR);  Surgeon: Jonathan Alberts, MD;  Location: [place];  Service: Open Heart Surgery;  Laterality: Right;  . Tee without cardioversion N/A 09/18/2014    Procedure: TRANSESOPHAGEAL ECHOCARDIOGRAM (TEE);  Surgeon: Jonathan Alberts, MD;  Location: [place];  Service: Open Heart Surgery;  Laterality: N/A;    History  Smoking status  . Never Smoker   Smokeless tobacco  . Never Used    History  Alcohol Use No    Social History   Social History  . Marital Status: Married    Spouse Name: N/A  . Number of Children: N/A  . Years of Education: N/A   Occupational History  . Not on file.   Social History Main Topics  . Smoking  status: Never Smoker   . Smokeless tobacco: Never Used  . Alcohol Use: No  . Drug Use: No  . Sexual Activity: Not on file   Other Topics Concern  . Not on file   Social History Narrative    Allergies  Allergen Reactions  . Peanut-Containing Drug Products Hives and Itching  . Tape Rash    Rash from tape wrapped around arm after giving blood    Current Facility-Administered Medications  Medication Dose Route Frequency Provider Last Rate Last Dose  . aspirin EC tablet 81 mg  81 mg Oral Daily Sid Falcon, MD   81 mg at 12/22/14 1053  . enoxaparin (LOVENOX) injection 40 mg  40 mg Subcutaneous Q24H Sid Falcon, MD   40 mg at 12/22/14 1054  . oxyCODONE (Oxy IR/ROXICODONE) immediate release tablet 5 mg  5 mg Oral Q4H PRN Sid Falcon, MD      . piperacillin-tazobactam (ZOSYN) IVPB 3.375 g  3.375 g Intravenous 3 times per day Jonathan Church, RPH   3.375 g at 12/22/14 1052  . promethazine (PHENERGAN) tablet 12.5 mg  12.5 mg Oral Q6H PRN Sid Falcon, MD      . senna-docusate (Senokot-S) tablet 1 tablet  1 tablet Oral QHS PRN Sid Falcon, MD      . vancomycin (VANCOCIN) 1,250 mg in sodium chloride 0.9 % 250 mL IVPB  1,250 mg Intravenous Q24H Jonathan Church, RPH   1,250 mg at 12/22/14 0330    Prescriptions prior to admission  Medication Sig Dispense Refill Last Dose  . acetaminophen (TYLENOL) 500 MG tablet Take 1,000 mg by mouth every 6 (six) hours as needed for fever.   12/21/2014 at lunch  . aspirin EC 81 MG EC tablet Take 1 tablet (81 mg total) by mouth daily. (Patient taking differently: Take 81 mg by mouth at bedtime. )   12/19/2014 at Unknown time  . OVER THE COUNTER MEDICATION Take 80,000-240,000 Units by mouth 3 (three) times a week. Serra peptase (1 capsule 80,000 units)  - protein destroying enzymes for inflammation and cholesterol   couple days ago  . ondansetron (ZOFRAN ODT) 8 MG disintegrating tablet Take 1 tablet (8 mg total) by mouth every 8 (eight) hours as needed for nausea or vomiting. (Patient not taking: Reported on 12/21/2014) 12 tablet 0 Not Taking at Unknown time    Family History  Problem Relation Age of Onset  . Heart failure Father   . Heart failure Paternal Grandmother    Review of Systems:  Constitutional: positive for chills, fevers and sweats Respiratory: negative for cough and dyspnea on exertion Cardiovascular: negative for chest pain, dyspnea, exertional chest pressure/discomfort and irregular heart beat Gastrointestinal: negative for diarrhea and nausea Hematologic/lymphatic: positive for right groin swelling Neurological: negative     Cardiac Review of Systems: Y or N  Chest Pain [ n  ]  Resting SOB [ n ] Exertional SOB  [ n]  Orthopnea [  ]   Pedal Edema [   ]      Palpitations [  ] Syncope  [  ]   Presyncope [   ]  General Review of Systems: [Y] = yes [  ]=no Constitional: recent weight change [  ]; anorexia [  ]; fatigue [  ]; nausea [  ]; night sweats Blue.Reese ]; fever Blue.Reese ]; or chills Blue.Reese ]  Dental: poor dentition[  ]; Last Dentist visit:   Eye : blurred vision [  ]; diplopia [   ]; vision changes [  ];  Amaurosis fugax[  ]; Resp: cough n ];  wheezing[  ];  hemoptysis[  ]; shortness of breath[n ]; paroxysmal nocturnal dyspnea[  ]; dyspnea on exertion[  ]; or orthopnea[  ];  GI:  gallstones[  ], vomiting[  ];  dysphagia[  ]; melena[  ];  hematochezia [  ]; heartburn[  ];   Hx of  Colonoscopy[  ]; GU: kidney stones [  ]; hematuria[  ];   dysuria [  ];  nocturia[  ];  history of     obstruction [  ]; urinary frequency [  ]             Skin: rash, swelling[ y];, hair loss[  ];  peripheral edema[  ];  or itching[  ]; Musculosketetal: myalgias[  ];  joint swelling[  ];  joint erythema[  ];  joint pain[  ];  back pain[  ];  Heme/Lymph: bruising[  ];  bleeding[  ];  anemia[  ];  Neuro: TIA[  ];  headaches[  ];  stroke[  ];  vertigo[  ];  seizures[  ];   paresthesias[  ];  difficulty walking[  ];  Psych:depression[  ]; anxiety[  ];  Endocrine: diabetes[  ];  thyroid dysfunction[  ];  Immunizations: Flu [  ]; Pneumococcal[  ];  Other:  Physical Exam: BP 112/66 mmHg  Pulse 105  Temp(Src) 98.7 F (37.1 C) (Oral)  Resp 18  Ht 5\' 8"  (1.727 m)  Wt 164 lb 1.6 oz (74.435 kg)  BMI 24.96 kg/m2  SpO2 92%   General appearance: alert, cooperative and no distress Head: Normocephalic, without obvious abnormality, atraumatic Resp: clear to auscultation bilaterally Cardio: regular rate and rhythm GI: soft, non-tender; bowel sounds normal; no masses,  no organomegaly Extremities: No LE edema, Right Groin is swollen with contained area of what appears to be seroma, minimal erythema present Neurologic: Grossly  normal  Diagnostic Studies & Laboratory data:     Recent Radiology Findings:   Dg Chest 2 View  12/21/2014  CLINICAL DATA:  Chills and fever starting 2 days ago. Swelling to the right groin. EXAM: CHEST  2 VIEW COMPARISON:  10/22/2014 FINDINGS: Mild cardiac enlargement without vascular congestion. Probable cardiac valve prosthesis. Elevation of the right hemidiaphragm with atelectasis in the right lung base. Appearance is similar to previous study. No developing consolidation or edema. No blunting of costophrenic angles. No pneumothorax. Degenerative changes in the spine. IMPRESSION: Elevation of right hemidiaphragm with atelectasis in the right lung base similar to prior study. Electronically Signed   By: Lucienne Capers M.D.   On: 12/21/2014 20:29   US Pelvis Limited  12/21/2014  CLINICAL DATA:  Chronic right inguinal swelling since surgery in August 2016, acutely worsened. Fever and chills for 2 days. Initial encounter. EXAM: LIMITED ULTRASOUND OF PELVIS TECHNIQUE: Limited transabdominal ultrasound examination of the pelvis was performed. COMPARISON:  CT of the abdomen and pelvis from 10/22/2014 FINDINGS: There is a mildly complex collection of fluid and debris at the right inguinal region, measuring 4.7 x 4.4 x 3.5 cm. No associated blood flow is seen on limited Doppler evaluation, and this is not directly adjacent to a blood vessel. This may reflect a postoperative seroma. There is no evidence for pseudoaneurysm. IMPRESSION: Mildly complex collection of fluid and debris noted at the right inguinal region,  measuring 4.7 cm, likely reflecting a postoperative seroma. No associated blood flow seen. Electronically Signed   By: Garald Balding M.D.   On: 12/21/2014 23:27     I have independently reviewed the above radiologic studies.  Recent Lab Findings: Lab Results  Component Value Date   WBC 18.6* 12/22/2014   HGB 13.7 12/22/2014   HCT 41.6 12/22/2014   PLT 208 12/22/2014   GLUCOSE 113*  12/22/2014   ALT 13* 10/22/2014   AST 25 10/22/2014   NA 135 12/22/2014   K 4.5 12/22/2014   CL 101 12/22/2014   CREATININE 1.67* 12/22/2014   BUN 18 12/22/2014   CO2 26 12/22/2014   INR 1.18 12/21/2014   HGBA1C 5.8* 09/16/2014   Assessment / Plan:     1. S/P Minimally Invasive MVR 2. Right Groin seroma-attempted aspiration, minimal fluid removed, sent what was obtained for culture, continue ABX 3. Dispo-  Dr. Servando Snare is aware of patient, however currently in OR with emergency surgery, Dr. Roxy Church will be back tomorrow, continue current care we will follow with you  I  spent 55 minutes counseling the patient face to face and 50% or more the  time was spent in counseling and coordination of care. The total time spent in the appointment was 80 minutes.  Jeron Grahn PA-C 12/22/2014 2:28 PM

## 2014-12-22 NOTE — Progress Notes (Signed)
[facility] PROGRESS NOTE  Jonathan Church [id number] DOB: 1945-12-02 DOA: 12/21/2014 PCP: London Pepper, MD  Assessment/Plan: 1. Possible infected postoperative seroma. -[place] presenting with a 4 day history of fevers, chills, malaise, having a temperature 102 at home, with lab work revealing white count of 18,600. -He has noted increasing swelling over right groin region, which was ultrasound and revealed findings suggestive of postoperative seroma. -Given other presenting symptoms and lab work there as concern for underlying infectious process, particularly infected seroma. -Cardiothoracic surgery was consulted. -Will continue empiric IV antibiotic therapy with Vancomycin and Zosyn -Chest x-ray did not show evidence of pneumonia, urine looked clean, blood culture showing no growth after overnight Patient. -Will await further recommendations from surgery.  2.  History of severe mitral regurgitation -Patient was taken to the operating room on 09/18/2014 undergoing minimally invasive mitral valve repair, procedure was performed by Dr. Roxy Manns of cardiothoracic surgery. -He denies shortness of breath, chest pain, extremity edema -He had a transthoracic echocardiogram done on 10/29/2014 that showed an EF of 50-55%, trivial regurgitation involving mitral valve, status post mitral valve repair with annuloplasty ring.  3. Stage III chronic kidney disease.  -Baseline creatinine near 1.5, presenting lab work showing creatinine of 1.63. Will continue to monitor.   Code Status: Full code Family Communication:  Disposition Plan: Cards thoracic surgery consulted   Consultants:  CT surgery  Antibiotics:  Vancomycin  Zosyn  HPI/Subjective: Patient is a 69 year old gentleman with a past medical history of mitral valve prolapse with severe symptoms, undergoing minimally invasive mitral valve repair on 09/18/2014, procedure was performed by Dr. Roxy Manns of cardiothoracic surgery.  Patient tolerated procedure well there are no immediate complications. He presented to the emergency department overnight with a 4 day history of fevers, chills, having a temperature 102 at home, noticing increase swelling involving his right growing. This site was accessed for his surgery back in August. Ultrasound revealed findings concerning for postoperative seroma. He was started on empiric IV antibiotic therapy with Vancomycin and Zosyn. Cardiothoracic surgery was consulted.  Objective: Filed Vitals:   12/22/14 0645 12/22/14 0838  BP: 105/63 112/66  Pulse: 96 105  Temp: 99.3 F (37.4 C) 98.7 F (37.1 C)  Resp: 16 18    Intake/Output Summary (Last 24 hours) at 12/22/14 1358 Last data filed at 12/22/14 0839  Gross per 24 hour  Intake    240 ml  Output      0 ml  Net    240 ml   Filed Weights   12/21/14 1942 12/22/14 0137  Weight: 78.926 kg (174 lb) 74.435 kg (164 lb 1.6 oz)    Exam:   General:  Patient is awake and alert, mentating well, nontoxic appearing  Cardiovascular: Regular rate and rhythm normal S1-S2  Respiratory: Normal respiratory effort, lungs clear to auscultation bilaterally  Abdomen: Soft nontender nondistended  Musculoskeletal: Inguinal region there is mass, mildly tender to palpation, did not appreciate surrounding erythema, fluctuance, discharge  Data Reviewed: Basic Metabolic Panel:  Recent Labs Lab 12/21/14 2010 12/22/14 0343  NA 136 135  K 4.3 4.5  CL 100* 101  CO2 27 26  GLUCOSE 102* 113*  BUN 18 18  CREATININE 1.63* 1.67*  CALCIUM 8.8* 8.4*   Liver Function Tests: No results for input(s): AST, ALT, ALKPHOS, BILITOT, PROT, ALBUMIN in the last 168 hours. No results for input(s): LIPASE, AMYLASE in the last 168 hours. No results for input(s): AMMONIA in the last 168 hours. CBC:  Recent Labs Lab 12/21/14 2010  12/22/14 0343  WBC 16.4* 18.6*  NEUTROABS 12.6*  --   HGB 14.5 13.7  HCT 43.7 41.6  MCV 96.0 96.1  PLT 199 208    Cardiac Enzymes: No results for input(s): CKTOTAL, CKMB, CKMBINDEX, TROPONINI in the last 168 hours. BNP (last 3 results) No results for input(s): BNP in the last 8760 hours.  ProBNP (last 3 results) No results for input(s): PROBNP in the last 8760 hours.  CBG: No results for input(s): GLUCAP in the last 168 hours.  Recent Results (from the past 240 hour(s))  Blood culture (routine x 2)     Status: None (Preliminary result)   Collection Time: 12/21/14  9:00 PM  Result Value Ref Range Status   Specimen Description BLOOD RIGHT ARM  Final   Special Requests BOTTLES DRAWN AEROBIC AND ANAEROBIC 5CC  Final   Culture NO GROWTH < 12 HOURS  Final   Report Status PENDING  Incomplete  Blood culture (routine x 2)     Status: None (Preliminary result)   Collection Time: 12/21/14  9:06 PM  Result Value Ref Range Status   Specimen Description BLOOD RIGHT HAND  Final   Special Requests BOTTLES DRAWN AEROBIC AND ANAEROBIC 5CC  Final   Culture NO GROWTH < 12 HOURS  Final   Report Status PENDING  Incomplete     Studies: Dg Chest 2 View  12/21/2014  CLINICAL DATA:  Chills and fever starting 2 days ago. Swelling to the right groin. EXAM: CHEST  2 VIEW COMPARISON:  10/22/2014 FINDINGS: Mild cardiac enlargement without vascular congestion. Probable cardiac valve prosthesis. Elevation of the right hemidiaphragm with atelectasis in the right lung base. Appearance is similar to previous study. No developing consolidation or edema. No blunting of costophrenic angles. No pneumothorax. Degenerative changes in the spine. IMPRESSION: Elevation of right hemidiaphragm with atelectasis in the right lung base similar to prior study. Electronically Signed   By: Lucienne Capers M.D.   On: 12/21/2014 20:29   US Pelvis Limited  12/21/2014  CLINICAL DATA:  Chronic right inguinal swelling since surgery in August 2016, acutely worsened. Fever and chills for 2 days. Initial encounter. EXAM: LIMITED ULTRASOUND OF PELVIS  TECHNIQUE: Limited transabdominal ultrasound examination of the pelvis was performed. COMPARISON:  CT of the abdomen and pelvis from 10/22/2014 FINDINGS: There is a mildly complex collection of fluid and debris at the right inguinal region, measuring 4.7 x 4.4 x 3.5 cm. No associated blood flow is seen on limited Doppler evaluation, and this is not directly adjacent to a blood vessel. This may reflect a postoperative seroma. There is no evidence for pseudoaneurysm. IMPRESSION: Mildly complex collection of fluid and debris noted at the right inguinal region, measuring 4.7 cm, likely reflecting a postoperative seroma. No associated blood flow seen. Electronically Signed   By: Garald Balding M.D.   On: 12/21/2014 23:27    Scheduled Meds: . aspirin EC  81 mg Oral Daily  . enoxaparin (LOVENOX) injection  40 mg Subcutaneous Q24H  . piperacillin-tazobactam (ZOSYN)  IV  3.375 g Intravenous 3 times per day  . vancomycin  1,250 mg Intravenous Q24H   Continuous Infusions:   Active Problems:   S/P MVR (mitral valve repair)   Fever and chills    Time spent: 35 min    Kelvin Cellar  [facility] Pager [phone]. If 7PM-7AM, please contact night-coverage at [email or web address], password [facility] 12/22/2014, 1:58 PM  LOS: 0 days

## 2014-12-22 NOTE — ED Provider Notes (Addendum)
I saw and evaluated the patient, reviewed the resident's note and I agree with the findings and plan.   EKG Interpretation   Date/Time:  Saturday December 21 2014 19:46:02 EST Ventricular Rate:  110 PR Interval:  160 QRS Duration: 148 QT Interval:  366 QTC Calculation: 495 R Axis:   -81 Text Interpretation:  Sinus tachycardia Left axis deviation Right bundle  branch block Abnormal ECG Aside from tachycardia, no significant change  when compared to prior EKG Confirmed by Allona Gondek MD, Haylea Schlichting [phone]) on 12/21/2014  7:57:53 PM        70 year old male with history of recent mitral valve repair in August and COPD who presents with 3-4 days of fever, chills, and fatigue. He has also noted some right groin swelling. No other upper respiratory or GI symptoms. No urinary complaints. On exam has a small non-mobile mass in the right groin, without overlying skin changes and no significant tenderness. Mass is nonpulsatile. Remainder of exam is unremarkable. Has had fever at home of [place]. Is afebrile here, but tachycardic. Is normotensive in no respiratory distress. Infectious workup is pursued, and chest x-ray and urinalysis does not show suggest infection. Blood cultures are also obtained. Ultrasound of his right groin masses consistent with seroma. Does not appear to be any overlying skin changes, and seems less likely infected, but given that this is newly recurrent may be source of his fever. Also given his recent mitral valve repairer, concern for possible endocarditis although has no other stigmata of this currently. Patient will be admitted for rule out of endocarditis vs infection of seroma.  Forde Dandy, MD 12/22/14 [id number]  Forde Dandy, MD 12/22/14 [phone]

## 2014-12-22 NOTE — Progress Notes (Signed)
ANTIBIOTIC CONSULT NOTE - INITIAL  Pharmacy Consult for Vancomycin/Zosyn  Indication: Wound infection  Allergies  Allergen Reactions  . Peanut-Containing Drug Products Hives and Itching  . Tape Rash    Rash from tape wrapped around arm after giving blood   Patient Measurements: Height: 5\' 8"  (172.7 cm) Weight: 164 lb 1.6 oz (74.435 kg) IBW/kg (Calculated) : 68.4  Vital Signs: Temp: 99.4 F (37.4 C) (11/27 0137) Temp Source: Oral (11/27 0137) BP: 113/74 mmHg (11/27 0137) Pulse Rate: 114 (11/27 0137)  Labs:  Recent Labs  12/21/14 2010  WBC 16.4*  HGB 14.5  PLT 199  CREATININE 1.63*   Estimated Creatinine Clearance: 42 mL/min (by C-G formula based on Cr of 1.63).  Medical History: Past Medical History  Diagnosis Date  . Sleep apnea     wears CPAP  . Heart murmur   . Head injury, closed   . Shortness of breath dyspnea     with exertion  . Asthma     years ago  . Hemorrhoids   . Severe mitral regurgitation 05/16/2014  . S/P minimally invasive mitral valve repair 09/18/2014    Complex valvuloplasty including triangular resection of flail segment of posterior leaflet, artificial Gore-tex neochord placement x4 and 32 mm Sorin Memo 3D Rechord ring annuloplasty via right mini thoracotomy approach   Assessment: 69 y/o M with fever and chills, swelling in groin. Recent MVR. U/S consistent with seroma in groin. Concern for infection. Starting broad spectrum anti-biotics. WBC is elevated, noted renal dysfunction, other labs/meds reviewed.   Goal of Therapy:  Vancomycin trough level 15-20 mcg/ml  Plan:  -Vancomycin 1250 mg IV q24h -Zosyn 3.375G IV q8h to be infused over 4 hours -Trend WBC, temp, renal function  -Drug levels as indicated   Narda Bonds 12/22/2014,1:49 AM

## 2014-12-22 NOTE — H&P (Signed)
[facility] History and Physical  Jonathan Church [id number] DOB: [birth year] DOA: 12/21/2014  Referring physician: ED physician PCP: London Pepper, MD   Chief Complaint: fever, chills  HPI:  Jonathan Church is a 69yo man with PMH of OSA, MR s/p repair in August of this year, CKD and apparently pre-DM who presents with fever, chills X 4 days and groin swelling.  Jonathan Church reports that about 4 days ago he started feeling poorly.  He noticed chills and sweating and took tylenol for it.  His Tmax at home was 102.  Today, he had shaking chills all day and figured he should come in to the hospital.  He also has had diaphoresis.  Today he noticed increased swelling in the groin which was at the site of a previous vascular access for his surgery to replace his mitral valve.  There is also redness over the site and warmth.  He denies any cough, sputum production.  He is constipated and reports occasional dizziness, but he has no other symptoms.  He has had no chest pain.  In the ED, an ultrasound was done of the groin and found a likely seroma.  CXR showed no obvious pneumonia.  BC X 2 were drawn.   Assessment and Plan: Fever and chills - Most concerning sites of infection would be endocarditis and infected seroma.  Feasibly his groin swelling could be a lymph node, however, it is not mobile and ultrasound was consistent with seroma - Start Abx with Vanc/Zosyn tonight - Re culture for any fever, no tylenol to allow fever to occur - BC X 2 sent in the ED - Consider CT scan of seroma if this will allow better visualization - TTE - Based on blood culture results, consider ID consult - He is hemodynamically stable and lactic acid is normal, will go to med-surg bed    S/P MVR (mitral valve repair) - See above, otherwise doing well, denies chest pain or SOB - Infectious work up as noted  CKD - Stable compared to baseline in August - IVF with NS at 100cc/hr overnight  Prediabetes - Check  A1C  Diet: Regular  DVT PPx: Lovenox      Radiological Exams on Admission: Dg Chest 2 View  12/21/2014  CLINICAL DATA:  Chills and fever starting 2 days ago. Swelling to the right groin. EXAM: CHEST  2 VIEW COMPARISON:  10/22/2014 FINDINGS: Mild cardiac enlargement without vascular congestion. Probable cardiac valve prosthesis. Elevation of the right hemidiaphragm with atelectasis in the right lung base. Appearance is similar to previous study. No developing consolidation or edema. No blunting of costophrenic angles. No pneumothorax. Degenerative changes in the spine. IMPRESSION: Elevation of right hemidiaphragm with atelectasis in the right lung base similar to prior study. Electronically Signed   By: Lucienne Capers M.D.   On: 12/21/2014 20:29   US Pelvis Limited  12/21/2014  CLINICAL DATA:  Chronic right inguinal swelling since surgery in August 2016, acutely worsened. Fever and chills for 2 days. Initial encounter. EXAM: LIMITED ULTRASOUND OF PELVIS TECHNIQUE: Limited transabdominal ultrasound examination of the pelvis was performed. COMPARISON:  CT of the abdomen and pelvis from 10/22/2014 FINDINGS: There is a mildly complex collection of fluid and debris at the right inguinal region, measuring 4.7 x 4.4 x 3.5 cm. No associated blood flow is seen on limited Doppler evaluation, and this is not directly adjacent to a blood vessel. This may reflect a postoperative seroma. There is no evidence for pseudoaneurysm. IMPRESSION: Mildly complex  collection of fluid and debris noted at the right inguinal region, measuring 4.7 cm, likely reflecting a postoperative seroma. No associated blood flow seen. Electronically Signed   By: Garald Balding M.D.   On: 12/21/2014 23:27   Code Status: Full Family Communication: Pt at bedside Disposition Plan: Admit for further evaluation    Gilles Chiquito, MD [phone]   Review of Systems:  Constitutional: + for fever, chills, malaise, diaphoresis.  Denies neck  stiffness, headache  HENT: Negative for hearing loss, ear pain Eyes: Negative for blurred vision, double vision Respiratory: Negative for cough, sputum production, shortness of breath Cardiovascular: Negative for chest pain, palpitations, orthopnea Gastrointestinal: Negative for nausea, vomiting and abdominal pain Genitourinary: + for swelling in right groin Negative for dysuria, urgency, frequency, hematuria Musculoskeletal: Negative for myalgias, back pain, joint pain and falls.  Skin: Negative for itching and rash.  Neurological: + for occasional dizziness Negative for  weakness. Negative for tingling, tremors, sensory change and headaches.  Endo/Heme/Allergies: Negative for environmental allergies and polydipsia. Does not bruise/bleed easily.     Past Medical History  Diagnosis Date  . Sleep apnea     wears CPAP  . Heart murmur   . Head injury, closed   . Shortness of breath dyspnea     with exertion  . Asthma     years ago  . Hemorrhoids   . Severe mitral regurgitation 05/16/2014  . S/P minimally invasive mitral valve repair 09/18/2014    Complex valvuloplasty including triangular resection of flail segment of posterior leaflet, artificial Gore-tex neochord placement x4 and 32 mm Sorin Memo 3D Rechord ring annuloplasty via right mini thoracotomy approach    Past Surgical History  Procedure Laterality Date  . Tee without cardioversion N/A 06/11/2014    Procedure: TRANSESOPHAGEAL ECHOCARDIOGRAM (TEE);  Surgeon: Arnoldo Lenis, MD;  Location: [facility];  Service: Cardiology;  Laterality: N/A;  . Cardiac catheterization N/A 06/18/2014    Procedure: Right/Left Heart Cath and Coronary Angiography;  Surgeon: Jettie Booze, MD;  Location: [place] [facility];  Service: Cardiovascular;  Laterality: N/A;  . Nasal septrum  1970's  . Colonoscopy    . Mitral valve repair Right 09/18/2014    Procedure: MINIMALLY INVASIVE MITRAL VALVE REPAIR (MVR);  Surgeon: [name], MD;   Location: [place];  Service: Open Heart Surgery;  Laterality: Right;  . Tee without cardioversion N/A 09/18/2014    Procedure: TRANSESOPHAGEAL ECHOCARDIOGRAM (TEE);  Surgeon: [name], MD;  Location: [place];  Service: Open Heart Surgery;  Laterality: N/A;    Social History:  reports that he has never smoked. He has never used smokeless tobacco. He reports that he does not drink alcohol or use illicit drugs.  Allergies  Allergen Reactions  . Peanut-Containing Drug Products Hives and Itching  . Tape Rash    Rash from tape wrapped around arm after giving blood    Family History  Problem Relation Age of Onset  . Heart failure Father   . Heart failure Paternal Grandmother     Prior to Admission medications   Medication Sig Start Date End Date Taking? Authorizing Provider  acetaminophen (TYLENOL) 500 MG tablet Take 1,000 mg by mouth every 6 (six) hours as needed for fever.   Yes Historical Provider, MD  aspirin EC 81 MG EC tablet Take 1 tablet (81 mg total) by mouth daily. Patient taking differently: Take 81 mg by mouth at bedtime.  09/22/14  Yes Donielle Liston Alba, PA-C  OVER THE  COUNTER MEDICATION Take 80,000-240,000 Units by mouth 3 (three) times a week. Serra peptase (1 capsule 80,000 units)  - protein destroying enzymes for inflammation and cholesterol   Yes Historical Provider, MD  ondansetron (ZOFRAN ODT) 8 MG disintegrating tablet Take 1 tablet (8 mg total) by mouth every 8 (eight) hours as needed for nausea or vomiting. Patient not taking: Reported on 12/21/2014 10/22/14   [name], MD    Physical Exam: Filed Vitals:   12/21/14 2200 12/21/14 2215 12/21/14 2230 12/21/14 2245  BP: 117/77 114/71 125/80 113/72  Pulse: 109 110 100 110  Temp:      TempSrc:      Resp: 22 20 21 19   Height:      Weight:      SpO2: 92% 93% 94% 91%    Physical Exam  Constitutional: Appears well-developed and well-nourished. No distress.  HENT: Normocephalic.  Oropharynx is clear and moist.   Eyes: Conjunctivae are normal. PERRLA, no scleral icterus.  Neck: Normal ROM. Neck supple.  CVS: RR, NR, S1/S2 +, no new murmurs noted Pulmonary: Effort and breath sounds normal, no rhonchi, wheezes, rales.  Abdominal: Soft. BS +,  no distension, tenderness Musculoskeletal: No edema and no tenderness.  GU: Swollen site at right groin, firm to palpation, mildly tender to palpation, erythema overlying Neuro: Alert. Normal  muscle tone coordination Skin: Skin is warm and dry. No rash noted. Erythema as noted above.  Psychiatric: Normal mood and affect. Behavior, judgment, thought content normal.   Labs on Admission:  Basic Metabolic Panel:  Recent Labs Lab 12/21/14 2010  NA 136  K 4.3  CL 100*  CO2 27  GLUCOSE 102*  BUN 18  CREATININE 1.63*  CALCIUM 8.8*   CBC:  Recent Labs Lab 12/21/14 2010  WBC 16.4*  NEUTROABS 12.6*  HGB 14.5  HCT 43.7  MCV 96.0  PLT 199    EKG: Normal sinus rhythm, RBBB, unchanged from previous   If 7PM-7AM, please contact night-coverage [email or web address] Password [email or web address] 12/22/2014, 1:17 AM

## 2014-12-23 ENCOUNTER — Inpatient Hospital Stay (HOSPITAL_COMMUNITY): Payer: Medicare Other | Admitting: Certified Registered Nurse Anesthetist

## 2014-12-23 ENCOUNTER — Encounter (HOSPITAL_COMMUNITY)
Admission: EM | Disposition: A | Discharge status: Home Health | Payer: Self-pay | Source: Home / Self Care | Attending: Internal Medicine

## 2014-12-23 ENCOUNTER — Encounter (HOSPITAL_COMMUNITY): Payer: Self-pay | Admitting: Thoracic Surgery (Cardiothoracic Vascular Surgery)

## 2014-12-23 ENCOUNTER — Inpatient Hospital Stay (HOSPITAL_COMMUNITY): Payer: Medicare Other

## 2014-12-23 DIAGNOSIS — R509 Fever, unspecified: Secondary | ICD-10-CM

## 2014-12-23 DIAGNOSIS — I9789 Other postprocedural complications and disorders of the circulatory system, not elsewhere classified: Secondary | ICD-10-CM

## 2014-12-23 HISTORY — PX: GROIN DEBRIDEMENT: SHX5159

## 2014-12-23 LAB — GLUCOSE, CAPILLARY
GLUCOSE-CAPILLARY: 79 mg/dL (ref 65–99)
Glucose-Capillary: 86 mg/dL (ref 65–99)

## 2014-12-23 SURGERY — DEBRIDEMENT, INGUINAL REGION
Anesthesia: Monitor Anesthesia Care | Laterality: Right

## 2014-12-23 MED ORDER — SODIUM CHLORIDE 0.9 % IV SOLN: 250.0000 mL | INTRAVENOUS | Status: DC | PRN

## 2014-12-23 MED ORDER — SODIUM CHLORIDE 0.9 % IJ SOLN
3.0000 mL | Freq: Two times a day (BID) | INTRAMUSCULAR | Status: DC
  Administered 2014-12-23 – 2014-12-25 (×4): 3 mL via INTRAVENOUS

## 2014-12-23 MED ORDER — SODIUM CHLORIDE 0.9 % IR SOLN
Status: DC | PRN
  Administered 2014-12-23: 1000 mL

## 2014-12-23 MED ORDER — PROPOFOL 10 MG/ML IV BOLUS
INTRAVENOUS | Status: AC
  Filled 2014-12-23: qty 20

## 2014-12-23 MED ORDER — FENTANYL CITRATE (PF) 100 MCG/2ML IJ SOLN
INTRAMUSCULAR | Status: DC | PRN
  Administered 2014-12-23 (×2): 25 ug via INTRAVENOUS

## 2014-12-23 MED ORDER — LIDOCAINE HCL (PF) 1 % IJ SOLN
INTRAMUSCULAR | Status: DC | PRN
  Administered 2014-12-23: 30 mL

## 2014-12-23 MED ORDER — SODIUM CHLORIDE 0.9 % IV SOLN
INTRAVENOUS | Status: DC | PRN
  Administered 2014-12-23: 11:00:00 via INTRAVENOUS

## 2014-12-23 MED ORDER — FENTANYL CITRATE (PF) 100 MCG/2ML IJ SOLN
25.0000 ug | INTRAMUSCULAR | Status: DC | PRN
Start: 2014-12-23 — End: 2014-12-23

## 2014-12-23 MED ORDER — ACETAMINOPHEN 650 MG RE SUPP: 650.0000 mg | RECTAL | Status: DC | PRN

## 2014-12-23 MED ORDER — SODIUM CHLORIDE 0.9 % IJ SOLN: 3.0000 mL | INTRAMUSCULAR | Status: DC | PRN

## 2014-12-23 MED ORDER — LACTATED RINGERS IV SOLN: INTRAVENOUS | Status: DC

## 2014-12-23 MED ORDER — DEXAMETHASONE SODIUM PHOSPHATE 4 MG/ML IJ SOLN
INTRAMUSCULAR | Status: AC
  Filled 2014-12-23: qty 1

## 2014-12-23 MED ORDER — LIDOCAINE HCL (PF) 1 % IJ SOLN
INTRAMUSCULAR | Status: AC
  Filled 2014-12-23: qty 30

## 2014-12-23 MED ORDER — OXYCODONE HCL 5 MG PO TABS: 5.0000 mg | ORAL_TABLET | ORAL | Status: DC | PRN

## 2014-12-23 MED ORDER — MEPERIDINE HCL 25 MG/ML IJ SOLN: 6.2500 mg | INTRAMUSCULAR | Status: DC | PRN

## 2014-12-23 MED ORDER — MORPHINE SULFATE (PF) 2 MG/ML IV SOLN: 2.0000 mg | INTRAVENOUS | Status: DC | PRN

## 2014-12-23 MED ORDER — PROPOFOL 500 MG/50ML IV EMUL
INTRAVENOUS | Status: DC | PRN
  Administered 2014-12-23: 50 ug/kg/min via INTRAVENOUS

## 2014-12-23 MED ORDER — MIDAZOLAM HCL 5 MG/5ML IJ SOLN
INTRAMUSCULAR | Status: DC | PRN
  Administered 2014-12-23: 2 mg via INTRAVENOUS

## 2014-12-23 MED ORDER — FENTANYL CITRATE (PF) 100 MCG/2ML IJ SOLN: 25.0000 ug | INTRAMUSCULAR | Status: DC | PRN

## 2014-12-23 MED ORDER — ONDANSETRON HCL 4 MG/2ML IJ SOLN
INTRAMUSCULAR | Status: AC
  Filled 2014-12-23: qty 2

## 2014-12-23 MED ORDER — FENTANYL CITRATE (PF) 250 MCG/5ML IJ SOLN
INTRAMUSCULAR | Status: AC
  Filled 2014-12-23: qty 5

## 2014-12-23 MED ORDER — PROMETHAZINE HCL 25 MG/ML IJ SOLN: 6.2500 mg | INTRAMUSCULAR | Status: DC | PRN

## 2014-12-23 MED ORDER — DOCUSATE SODIUM 100 MG PO CAPS
200.0000 mg | ORAL_CAPSULE | Freq: Every day | ORAL | Status: DC
  Administered 2014-12-23 – 2014-12-24 (×2): 200 mg via ORAL
  Filled 2014-12-23 (×3): qty 2

## 2014-12-23 MED ORDER — ACETAMINOPHEN 325 MG PO TABS: 650.0000 mg | ORAL_TABLET | ORAL | Status: DC | PRN

## 2014-12-23 MED ORDER — ENOXAPARIN SODIUM 30 MG/0.3ML ~~LOC~~ SOLN
30.0000 mg | SUBCUTANEOUS | Status: DC
  Filled 2014-12-23: qty 0.3

## 2014-12-23 MED ORDER — DEXAMETHASONE SODIUM PHOSPHATE 4 MG/ML IJ SOLN
INTRAMUSCULAR | Status: DC | PRN
  Administered 2014-12-23: 4 mg via INTRAVENOUS

## 2014-12-23 MED ORDER — MIDAZOLAM HCL 2 MG/2ML IJ SOLN
INTRAMUSCULAR | Status: AC
  Filled 2014-12-23: qty 2

## 2014-12-23 MED ORDER — ONDANSETRON HCL 4 MG/2ML IJ SOLN
INTRAMUSCULAR | Status: DC | PRN
  Administered 2014-12-23: 4 mg via INTRAVENOUS

## 2014-12-23 MED ORDER — DEXAMETHASONE SODIUM PHOSPHATE 4 MG/ML IJ SOLN
INTRAMUSCULAR | Status: AC
  Filled 2014-12-23: qty 2

## 2014-12-23 MED ORDER — PROPOFOL 10 MG/ML IV BOLUS
INTRAVENOUS | Status: DC | PRN
  Administered 2014-12-23: 20 mg via INTRAVENOUS

## 2014-12-23 SURGICAL SUPPLY — 63 items
ATTRACTOMAT 16X20 MAGNETIC DRP (DRAPES) ×2 IMPLANT
BAG DECANTER FOR FLEXI CONT (MISCELLANEOUS) ×2 IMPLANT
BENZOIN TINCTURE PRP APPL 2/3 (GAUZE/BANDAGES/DRESSINGS) IMPLANT
BLADE SURG 10 STRL SS (BLADE) ×4 IMPLANT
BNDG GAUZE ELAST 4 BULKY (GAUZE/BANDAGES/DRESSINGS) IMPLANT
CANISTER SUCTION 2500CC (MISCELLANEOUS) ×2 IMPLANT
CATH THORACIC 28FR RT ANG (CATHETERS) IMPLANT
CATH THORACIC 36FR (CATHETERS) IMPLANT
CATH THORACIC 36FR RT ANG (CATHETERS) IMPLANT
CLIP TI WIDE RED SMALL 24 (CLIP) IMPLANT
CONN Y 3/8X3/8X3/8  BEN (MISCELLANEOUS) ×1
CONN Y 3/8X3/8X3/8 BEN (MISCELLANEOUS) ×1 IMPLANT
CONT SPEC 4OZ CLIKSEAL STRL BL (MISCELLANEOUS) IMPLANT
COVER SURGICAL LIGHT HANDLE (MISCELLANEOUS) ×2 IMPLANT
DRAPE INCISE IOBAN 66X45 STRL (DRAPES) ×2 IMPLANT
DRAPE LAPAROSCOPIC ABDOMINAL (DRAPES) ×2 IMPLANT
DRSG PAD ABDOMINAL 8X10 ST (GAUZE/BANDAGES/DRESSINGS) ×6 IMPLANT
DRSG VAC ATS SM SENSATRAC (GAUZE/BANDAGES/DRESSINGS) ×2 IMPLANT
ELECT REM PT RETURN 9FT ADLT (ELECTROSURGICAL) ×2
ELECTRODE REM PT RTRN 9FT ADLT (ELECTROSURGICAL) ×1 IMPLANT
GAUZE SPONGE 4X4 12PLY STRL (GAUZE/BANDAGES/DRESSINGS) ×2 IMPLANT
GAUZE XEROFORM 5X9 LF (GAUZE/BANDAGES/DRESSINGS) IMPLANT
GLOVE NEODERM STER SZ 7 (GLOVE) ×2 IMPLANT
GOWN STRL REUS W/ TWL LRG LVL3 (GOWN DISPOSABLE) ×4 IMPLANT
GOWN STRL REUS W/TWL LRG LVL3 (GOWN DISPOSABLE) ×8
HANDPIECE INTERPULSE COAX TIP (DISPOSABLE)
HEMOSTAT POWDER SURGIFOAM 1G (HEMOSTASIS) IMPLANT
HEMOSTAT SURGICEL 2X14 (HEMOSTASIS) ×2 IMPLANT
KIT BASIN OR (CUSTOM PROCEDURE TRAY) ×2 IMPLANT
KIT ROOM TURNOVER OR (KITS) ×2 IMPLANT
KIT SUCTION CATH 14FR (SUCTIONS) IMPLANT
NEEDLE 18GX1X1/2 (RX/OR ONLY) (NEEDLE) ×2 IMPLANT
NEEDLE HYPO 25GX1X1/2 BEV (NEEDLE) ×2 IMPLANT
NS IRRIG 1000ML POUR BTL (IV SOLUTION) ×2 IMPLANT
PACK CHEST (CUSTOM PROCEDURE TRAY) ×2 IMPLANT
PAD ARMBOARD 7.5X6 YLW CONV (MISCELLANEOUS) ×4 IMPLANT
PIN SAFETY STERILE (MISCELLANEOUS) IMPLANT
RUBBERBAND STERILE (MISCELLANEOUS) IMPLANT
SET HNDPC FAN SPRY TIP SCT (DISPOSABLE) IMPLANT
SOLUTION BETADINE 4OZ (MISCELLANEOUS) IMPLANT
SPONGE LAP 18X18 X RAY DECT (DISPOSABLE) ×2 IMPLANT
STAPLER VISISTAT 35W (STAPLE) IMPLANT
STRAP MONTGOMERY 1.25X11-1/8 (MISCELLANEOUS) IMPLANT
SUT ETHILON 3 0 FSL (SUTURE) IMPLANT
SUT MNCRL AB 4-0 PS2 18 (SUTURE) IMPLANT
SUT PDS AB 1 CTX 36 (SUTURE) ×4 IMPLANT
SUT STEEL 6MS V (SUTURE) IMPLANT
SUT STEEL STERNAL CCS#1 18IN (SUTURE) IMPLANT
SUT STEEL SZ 6 DBL 3X14 BALL (SUTURE) IMPLANT
SUT VIC AB 1 CTX 36 (SUTURE)
SUT VIC AB 1 CTX36XBRD ANBCTR (SUTURE) IMPLANT
SUT VIC AB 2-0 CTX 27 (SUTURE) IMPLANT
SUT VIC AB 3-0 X1 27 (SUTURE) IMPLANT
SWAB COLLECTION DEVICE MRSA (MISCELLANEOUS) ×2 IMPLANT
SWAB CULTURE ESWAB REG 1ML (MISCELLANEOUS) ×2 IMPLANT
SYR 20CC LL (SYRINGE) ×2 IMPLANT
SYR CONTROL 10ML LL (SYRINGE) ×2 IMPLANT
SYSTEM SAHARA CHEST DRAIN ATS (WOUND CARE) ×2 IMPLANT
TOWEL OR 17X24 6PK STRL BLUE (TOWEL DISPOSABLE) ×2 IMPLANT
TOWEL OR 17X26 10 PK STRL BLUE (TOWEL DISPOSABLE) ×2 IMPLANT
TRAP SPECIMEN MUCOUS 40CC (MISCELLANEOUS) ×2 IMPLANT
TUBE ANAEROBIC SPECIMEN COL (MISCELLANEOUS) IMPLANT
WATER STERILE IRR 1000ML POUR (IV SOLUTION) ×2 IMPLANT

## 2014-12-23 NOTE — Progress Notes (Signed)
Orders placed for Pt to have Right groin debridement around 0822. Pt went down to radiology for Echo around 0902. Short Stay nurse called about pt. RN stated that pt was off unit and consent and pre-op checklist not completed. Short stay nurse stated that report should be called and to call when pt arrived back on unit. RN called report. Pt arrived back on unit around 1002. RN and NT gave pt CHG bath, and RN obtained consent. Transporter arrived about 10 mins after to take pt to OR.    Gavin Potters

## 2014-12-23 NOTE — Progress Notes (Signed)
  Echocardiogram 2D Echocardiogram has been performed.  Kenora Spayd 12/23/2014, 10:01 AM

## 2014-12-23 NOTE — Transfer of Care (Signed)
Immediate Anesthesia Transfer of Care Note  Patient: Jonathan Church  Procedure(s) Performed: Procedure(s): GROIN DEBRIDEMENT with wound VAC application (Right)  Patient Location: PACU  Anesthesia Type:MAC  Level of Consciousness: awake, alert , oriented and patient cooperative  Airway & Oxygen Therapy: Patient Spontanous Breathing and Patient connected to nasal cannula oxygen  Post-op Assessment: Report given to RN, Post -op Vital signs reviewed and stable and Patient moving all extremities X 4  Post vital signs: Reviewed and stable  Last Vitals:  Filed Vitals:   12/22/14 2100 12/23/14 0454  BP: 105/69 113/68  Pulse: 99 98  Temp: 38.1 C 37.1 C  Resp: 16 16    Complications: No apparent anesthesia complications

## 2014-12-23 NOTE — Op Note (Signed)
CARDIOTHORACIC SURGERY OPERATIVE NOTE  Date of Procedure:   12/23/2014  Preoperative Diagnosis:  Infected Right Groin Lymphocele  Postoperative Diagnosis:  same  Procedure:      Incisional Debridement Right Groin Wound  Application of Wound Vacuum Assisted Closure Device  Surgeon:    Valentina Gu. Roxy Manns, MD  Assistant:    n/a  Anesthesia:    1% lidocaine local with MAC  Operative Findings:   Moderate-sized right groin lymphocele with thin yellow serous fluid  Moderate surrounding inflammation     BRIEF CLINICAL NOTE AND INDICATIONS FOR SURGERY  Patient is a 69 year old male who underwent minimally invasive mitral valve repair on 09/18/2014 for mitral valve prolapse with severe symptomatic primary mitral regurgitation. The patient's postoperative recovery was uncomplicated. On routine follow-up in October patient was noted to have a small lymphocele in the right groin on physical exam. The patient was asymptomatic at that time. Observation was recommended. The patient was admitted to the hospital acutely on 12/22/2014 with several day history of fever, chills, and increased swelling and tenderness of the right groin. He was noted to have leukocytosis on admission blood work. Needle aspiration of the patient's right groin lymphocele was attempted at the bedside but unsuccessful.  Options for management were discussed. The patient is brought to the operating room for surgical incision and drainage for definitive management. The patient understands and accept all associated risks and desires to proceed as described.     DETAILS OF THE OPERATIVE PROCEDURE  The patient is brought to the operating room on the above mentioned date and placed in the supine position on the operating table.  Intravenous sedation is administered by the anesthesia team. Intravenous boxer administered. The patient's right groin was prepared and draped in a sterile manner. 1% lidocaine solution is utilized to  anesthetize his skin and subcutaneous tissues immediately overlying the patient's palpable mass in the right groin. An 18-gauge needle was used to aspirate the lymphocele. Approximately 20 mL of thin serous fluid is evacuated and sent for routine culture and sensitivity.  A #10 blade knife was utilized to make an oblique incision through the patient's previous scar in the right groin. The incision is completed through the subcutaneous tissues with electrocautery. There is dense acute inflammation present. Ultimately the cavity is encountered with smooth lining and thin serous fluid. The cavity is opened widely and wound swab culture obtained. The wound is irrigated with saline solution. No purulence is encountered. The wound measures approximately 5 cm in length by 5 cm in depth by 2 cm in width. A- wound VAC (negative pressure vacuum assisted wound closure) sponge is trimmed to an appropriate length. A total of 2 sponges is utilized to fill the deep portion of the wound.  The device is connected and negative pressure wound care therapy initiated. The patient tolerated the procedure well and was transported to the postanesthesia care unit in stable condition. Estimated blood loss was trivial. There were no intraoperative complications.     Valentina Gu. Roxy Manns MD 12/23/2014 11:44 AM

## 2014-12-23 NOTE — Anesthesia Procedure Notes (Signed)
Procedure Name: MAC Date/Time: 12/23/2014 11:00 AM Performed by: Layla Maw Pre-anesthesia Checklist: Patient identified, Emergency Drugs available, Suction available and Patient being monitored Patient Re-evaluated:Patient Re-evaluated prior to inductionOxygen Delivery Method: Simple face mask Preoxygenation: Pre-oxygenation with 100% oxygen Number of attempts: 1

## 2014-12-23 NOTE — Progress Notes (Signed)
Utilization review completed.  

## 2014-12-23 NOTE — Anesthesia Postprocedure Evaluation (Signed)
Anesthesia Post Note  Patient: KARL BUHRMAN  Procedure(s) Performed: Procedure(s) (LRB): GROIN DEBRIDEMENT with wound VAC application (Right)  Patient location during evaluation: PACU Anesthesia Type: MAC Level of consciousness: awake and alert Pain management: pain level controlled Vital Signs Assessment: post-procedure vital signs reviewed and stable Respiratory status: spontaneous breathing, nonlabored ventilation, respiratory function stable and patient connected to nasal cannula oxygen Cardiovascular status: stable and blood pressure returned to baseline Anesthetic complications: no    Last Vitals:  Filed Vitals:   12/23/14 1330 12/23/14 1400  BP: 109/73 113/73  Pulse: 75 88  Temp: 36.7 C 36.4 C  Resp: 10 18    Last Pain:  Filed Vitals:   12/23/14 1407  PainSc: 0-No pain                 Effie Berkshire

## 2014-12-23 NOTE — Anesthesia Preprocedure Evaluation (Addendum)
Anesthesia Evaluation  Patient identified by MRN, date of birth, ID band Patient awake    Reviewed: Allergy & Precautions, NPO status , Patient's Chart, lab work & pertinent test results  Airway Mallampati: II  TM Distance: >3 FB Neck ROM: Full    Dental  (+) Teeth Intact   Pulmonary asthma , sleep apnea and Continuous Positive Airway Pressure Ventilation ,    breath sounds clear to auscultation       Cardiovascular negative cardio ROS   Rhythm:Regular Rate:Normal     Neuro/Psych negative neurological ROS  negative psych ROS   GI/Hepatic negative GI ROS, Neg liver ROS,   Endo/Other  negative endocrine ROS  Renal/GU negative Renal ROS  negative genitourinary   Musculoskeletal negative musculoskeletal ROS (+)   Abdominal   Peds negative pediatric ROS (+)  Hematology negative hematology ROS (+)   Anesthesia Other Findings   Reproductive/Obstetrics negative OB ROS                            Lab Results  Component Value Date   WBC 18.6* 12/22/2014   HGB 13.7 12/22/2014   HCT 41.6 12/22/2014   MCV 96.1 12/22/2014   PLT 208 12/22/2014   Lab Results  Component Value Date   CREATININE 1.67* 12/22/2014   BUN 18 12/22/2014   NA 135 12/22/2014   K 4.5 12/22/2014   CL 101 12/22/2014   CO2 26 12/22/2014   Lab Results  Component Value Date   INR 1.18 12/21/2014   INR 1.5 12/12/2014   INR 1.7 12/02/2014   EKG: sinus tachycardia, RBBB.  Echo (10/29/14)  - Left ventricle: The cavity size was normal. Systolic function was normal. The estimated ejection fraction was in the range of 50% to 55%. Wall motion was normal; there were no regional wall motion abnormalities. There was an increased relative contribution of atrial contraction to ventricular filling. Doppler parameters are consistent with abnormal left ventricular relaxation (grade 1 diastolic dysfunction). Doppler  parameters are consistent with high ventricular filling pressure. - Ventricular septum: Septal motion showed paradox. These changes are consistent with intraventricular conduction delay. - Aorta: Aortic root dimension: 38 mm (ED). 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 - Mitral valve: S/P MV repair with annuloplasty ring. There was trivial regurgitation. - Tricuspid valve: There was mild regurgitation. - Pulmonic valve: There was trivial regurgitation. - Pulmonary arteries: PA peak pressure: 32 mm Hg (S).   Anesthesia Physical Anesthesia Plan  ASA: III  Anesthesia Plan: MAC   Post-op Pain Management:    Induction: Intravenous  Airway Management Planned: Natural Airway and Simple Face Mask  Additional Equipment:   Intra-op Plan:   Post-operative Plan: Extubation in OR  Informed Consent: I have reviewed the patients History and Physical, chart, labs and discussed the procedure including the risks, benefits and alternatives for the proposed anesthesia with the patient or authorized representative who has indicated his/her understanding and acceptance.   Dental advisory given  Plan Discussed with: CRNA  Anesthesia Plan Comments: (Possible LMA. )       Anesthesia Quick Evaluation

## 2014-12-23 NOTE — Consult Note (Addendum)
WOC wound consult note Reason for Consult: Consult requested for wound Vac.  Pt went to the OR today for Vac placement by Dr Roxy Manns and EMR indicates he will discharge with Vac machine and home health needs. This should be arranged by the care manager on [place]. First post-op dressing change scheduled for Wed; will assist with dressing change as desired. Julien Girt MSN, RN, [place], [place], [place]

## 2014-12-23 NOTE — Progress Notes (Addendum)
[facility] PROGRESS NOTE  Jonathan Church [id number] DOB: Jan 18, 1946 DOA: 12/21/2014 PCP: London Pepper, MD  Assessment/Plan: 1. Postoperative lymphocele -Hirth presenting with a 4 day history of fevers, chills, malaise, having a temperature 102 at home, with lab work revealing white count of 18,600. -He has noted increasing swelling over right groin region, which was ultrasound and revealed findings suggestive of postoperative seroma. -Given other presenting symptoms and lab work there as concern for underlying infectious process, particularly infected seroma. -Cardiothoracic surgery was consulted. -Will continue empiric IV antibiotic therapy with Vancomycin and Zosyn -Chest x-ray did not show evidence of pneumonia, urine looked clean, blood culture showing no growth after overnight incubation -Cardiothoracic surgery performing incisional debridement of right groin wound with application of wound vacuum, procedure performed on 12/23/2014 by Dr. Roxy Manns. Patient tolerated procedure well. He remains afebrile, hemodynamically stable. Will continue empiric IV antibiotic therapy with vancomycin and Zosyn as wound cultures are pending. Blood culture showing no growth to date.  2.  History of severe mitral regurgitation -Patient was taken to the operating room on 09/18/2014 undergoing minimally invasive mitral valve repair, procedure was performed by Dr. Roxy Manns of cardiothoracic surgery. -He denies shortness of breath, chest pain, extremity edema -He had a transthoracic echocardiogram done on 10/29/2014 that showed an EF of 50-55%, trivial regurgitation involving mitral valve, status post mitral valve repair with annuloplasty ring.  3. Stage III chronic kidney disease.  -Baseline creatinine near 1.5, presenting lab work showing creatinine of 1.63. Will continue to monitor. -On 12/15/2014 creatinine stable at 1.67   Code Status: Full code Family Communication:  Disposition Plan: Cards  thoracic surgery consulted   Consultants:  CT surgery  Antibiotics:  Vancomycin  Zosyn  HPI/Subjective: Patient is a 69 year old gentleman with a past medical history of mitral valve prolapse with severe symptoms, undergoing minimally invasive mitral valve repair on 09/18/2014, procedure was performed by Dr. Roxy Manns of cardiothoracic surgery. Patient tolerated procedure well there are no immediate complications. He presented to the emergency department overnight with a 4 day history of fevers, chills, having a temperature 102 at home, noticing increase swelling involving his right growing. This site was accessed for his surgery back in August. Ultrasound revealed findings concerning for postoperative seroma. He was started on empiric IV antibiotic therapy with Vancomycin and Zosyn. Cardiothoracic surgery was consulted.  Objective: Filed Vitals:   12/23/14 1330 12/23/14 1400  BP: 109/73 113/73  Pulse: 75 88  Temp: 98 F (36.7 C) 97.5 F (36.4 C)  Resp: 10 18    Intake/Output Summary (Last 24 hours) at 12/23/14 1757 Last data filed at 12/23/14 1550  Gross per 24 hour  Intake    340 ml  Output   1462 ml  Net  -1122 ml   Filed Weights   12/21/14 1942 12/22/14 0137 12/22/14 2100  Weight: 78.926 kg (174 lb) 74.435 kg (164 lb 1.6 oz) 74.5 kg (164 lb 3.9 oz)    Exam:   General:  Patient is awake and alert, mentating well, nontoxic appearing  Cardiovascular: Regular rate and rhythm normal S1-S2  Respiratory: Normal respiratory effort, lungs clear to auscultation bilaterally  Abdomen: Soft nontender nondistended  Musculoskeletal: Inguinal region there is mass, mildly tender to palpation, did not appreciate surrounding erythema, fluctuance, discharge  Data Reviewed: Basic Metabolic Panel:  Recent Labs Lab 12/21/14 2010 12/22/14 0343  NA 136 135  K 4.3 4.5  CL 100* 101  CO2 27 26  GLUCOSE 102* 113*  BUN 18 18  CREATININE 1.63*  1.67*  CALCIUM 8.8* 8.4*   Liver  Function Tests: No results for input(s): AST, ALT, ALKPHOS, BILITOT, PROT, ALBUMIN in the last 168 hours. No results for input(s): LIPASE, AMYLASE in the last 168 hours. No results for input(s): AMMONIA in the last 168 hours. CBC:  Recent Labs Lab 12/21/14 2010 12/22/14 0343  WBC 16.4* 18.6*  NEUTROABS 12.6*  --   HGB 14.5 13.7  HCT 43.7 41.6  MCV 96.0 96.1  PLT 199 208   Cardiac Enzymes: No results for input(s): CKTOTAL, CKMB, CKMBINDEX, TROPONINI in the last 168 hours. BNP (last 3 results) No results for input(s): BNP in the last 8760 hours.  ProBNP (last 3 results) No results for input(s): PROBNP in the last 8760 hours.  CBG:  Recent Labs Lab 12/23/14 0755 12/23/14 1054  GLUCAP 79 86    Recent Results (from the past 240 hour(s))  Blood culture (routine x 2)     Status: None (Preliminary result)   Collection Time: 12/21/14  9:00 PM  Result Value Ref Range Status   Specimen Description BLOOD RIGHT ARM  Final   Special Requests BOTTLES DRAWN AEROBIC AND ANAEROBIC 5CC  Final   Culture NO GROWTH 2 DAYS  Final   Report Status PENDING  Incomplete  Blood culture (routine x 2)     Status: None (Preliminary result)   Collection Time: 12/21/14  9:06 PM  Result Value Ref Range Status   Specimen Description BLOOD RIGHT HAND  Final   Special Requests BOTTLES DRAWN AEROBIC AND ANAEROBIC 5CC  Final   Culture NO GROWTH 2 DAYS  Final   Report Status PENDING  Incomplete  Body fluid culture     Status: None (Preliminary result)   Collection Time: 12/22/14  4:15 PM  Result Value Ref Range Status   Specimen Description FLUID RIGHT GROIN  Final   Special Requests Normal  Final   Gram Stain   Final    MODERATE WBC PRESENT,BOTH PMN AND MONONUCLEAR NO ORGANISMS SEEN    Culture NO GROWTH < 24 HOURS  Final   Report Status PENDING  Incomplete     Studies: Dg Chest 2 View  12/21/2014  CLINICAL DATA:  Chills and fever starting 2 days ago. Swelling to the right groin. EXAM: CHEST   2 VIEW COMPARISON:  10/22/2014 FINDINGS: Mild cardiac enlargement without vascular congestion. Probable cardiac valve prosthesis. Elevation of the right hemidiaphragm with atelectasis in the right lung base. Appearance is similar to previous study. No developing consolidation or edema. No blunting of costophrenic angles. No pneumothorax. Degenerative changes in the spine. IMPRESSION: Elevation of right hemidiaphragm with atelectasis in the right lung base similar to prior study. Electronically Signed   By: Lucienne Capers M.D.   On: 12/21/2014 20:29   US Pelvis Limited  12/21/2014  CLINICAL DATA:  Chronic right inguinal swelling since surgery in August 2016, acutely worsened. Fever and chills for 2 days. Initial encounter. EXAM: LIMITED ULTRASOUND OF PELVIS TECHNIQUE: Limited transabdominal ultrasound examination of the pelvis was performed. COMPARISON:  CT of the abdomen and pelvis from 10/22/2014 FINDINGS: There is a mildly complex collection of fluid and debris at the right inguinal region, measuring 4.7 x 4.4 x 3.5 cm. No associated blood flow is seen on limited Doppler evaluation, and this is not directly adjacent to a blood vessel. This may reflect a postoperative seroma. There is no evidence for pseudoaneurysm. IMPRESSION: Mildly complex collection of fluid and debris noted at the right inguinal region, measuring 4.7 cm,  likely reflecting a postoperative seroma. No associated blood flow seen. Electronically Signed   By: Garald Balding M.D.   On: 12/21/2014 23:27    Scheduled Meds: . aspirin EC  81 mg Oral Daily  . docusate sodium  200 mg Oral Daily  . [START ON 12/24/2014] enoxaparin (LOVENOX) injection  30 mg Subcutaneous Q24H  . piperacillin-tazobactam (ZOSYN)  IV  3.375 g Intravenous 3 times per day  . sodium chloride  3 mL Intravenous Q12H  . vancomycin  1,250 mg Intravenous Q24H   Continuous Infusions:   Active Problems:   S/P MVR (mitral valve repair)   Fever and chills   Lymphocele  after surgical procedure    Time spent: 25 min    Kelvin Cellar  [facility] Pager [phone]. If 7PM-7AM, please contact night-coverage at [email or web address], password [facility] 12/23/2014, 5:57 PM  LOS: 1 day

## 2014-12-23 NOTE — Care Management (Signed)
Please complete and sign prescription for [facility], located in hard/shadow chart at [place] desk.  Thank you.

## 2014-12-24 ENCOUNTER — Encounter (HOSPITAL_COMMUNITY): Payer: Self-pay | Admitting: Thoracic Surgery (Cardiothoracic Vascular Surgery)

## 2014-12-24 DIAGNOSIS — N189 Chronic kidney disease, unspecified: Secondary | ICD-10-CM

## 2014-12-24 DIAGNOSIS — N179 Acute kidney failure, unspecified: Secondary | ICD-10-CM

## 2014-12-24 LAB — URINALYSIS, ROUTINE W REFLEX MICROSCOPIC
Bilirubin Urine: NEGATIVE
Glucose, UA: NEGATIVE mg/dL
Hgb urine dipstick: NEGATIVE
Ketones, ur: NEGATIVE mg/dL
Leukocytes, UA: NEGATIVE
Nitrite: NEGATIVE
Protein, ur: NEGATIVE mg/dL
Specific Gravity, Urine: 1.02 (ref 1.005–1.030)
pH: 5 (ref 5.0–8.0)

## 2014-12-24 LAB — BASIC METABOLIC PANEL WITH GFR
Anion gap: 6 (ref 5–15)
BUN: 24 mg/dL — ABNORMAL HIGH (ref 6–20)
CO2: 31 mmol/L (ref 22–32)
Calcium: 8.9 mg/dL (ref 8.9–10.3)
Chloride: 105 mmol/L (ref 101–111)
Creatinine, Ser: 2.35 mg/dL — ABNORMAL HIGH (ref 0.61–1.24)
GFR calc Af Amer: 31 mL/min — ABNORMAL LOW
GFR calc non Af Amer: 27 mL/min — ABNORMAL LOW
Glucose, Bld: 155 mg/dL — ABNORMAL HIGH (ref 65–99)
Potassium: 4.6 mmol/L (ref 3.5–5.1)
Sodium: 142 mmol/L (ref 135–145)

## 2014-12-24 LAB — CBC
HEMATOCRIT: 42.2 % (ref 39.0–52.0)
HEMOGLOBIN: 13.9 g/dL (ref 13.0–17.0)
MCH: 31.5 pg (ref 26.0–34.0)
MCHC: 32.9 g/dL (ref 30.0–36.0)
MCV: 95.7 fL (ref 78.0–100.0)
Platelets: 264 10*3/uL (ref 150–400)
RBC: 4.41 MIL/uL (ref 4.22–5.81)
RDW: 12.8 % (ref 11.5–15.5)
WBC: 12.8 10*3/uL — ABNORMAL HIGH (ref 4.0–10.5)

## 2014-12-24 LAB — VANCOMYCIN, RANDOM: VANCOMYCIN RM: 15 ug/mL

## 2014-12-24 MED ORDER — VANCOMYCIN HCL 10 G IV SOLR
1250.0000 mg | INTRAVENOUS | Status: DC
  Filled 2014-12-24: qty 1250

## 2014-12-24 MED ORDER — SODIUM CHLORIDE 0.9 % IV SOLN
INTRAVENOUS | Status: DC
  Administered 2014-12-24: 1000 mL via INTRAVENOUS
  Administered 2014-12-24: 12:00:00 via INTRAVENOUS
  Administered 2014-12-24: 1000 mL via INTRAVENOUS
  Administered 2014-12-26: 05:00:00 via INTRAVENOUS

## 2014-12-24 MED ORDER — SODIUM CHLORIDE 0.9 % IV BOLUS (SEPSIS)
500.0000 mL | Freq: Once | INTRAVENOUS | Status: AC
  Administered 2014-12-24: 500 mL via INTRAVENOUS

## 2014-12-24 NOTE — Progress Notes (Signed)
    [facility] has been requested to perform a transesophageal echocardiogram on 12/25/14 for possible SBE.  After careful review of history and examination, the risks and benefits of transesophageal echocardiogram have been explained including risks of esophageal damage, perforation (1:10,000 risk), bleeding, pharyngeal hematoma as well as other potential complications associated with conscious sedation including aspiration, arrhythmia, respiratory failure and death. Alternatives to treatment were discussed, questions were answered. Patient is willing to proceed.   Eileen Stanford, PA-C 12/24/2014 2:51 PM

## 2014-12-24 NOTE — Progress Notes (Signed)
ANTIBIOTIC CONSULT NOTE - FOLLOW UP  Pharmacy Consult for Vancomycin and Zosyn Indication: wound infection  Allergies  Allergen Reactions  . Peanut-Containing Drug Products Hives and Itching  . Tape Rash    Rash from tape wrapped around arm after giving blood    Patient Measurements: Height: 5\' 8"  (172.7 cm) Weight: 165 lb (74.844 kg) IBW/kg (Calculated) : 68.4  Vital Signs: Temp: 97.4 F (36.3 C) (11/29 2052) Temp Source: Oral (11/29 2052) BP: 104/74 mmHg (11/29 2052) Pulse Rate: 78 (11/29 2052) Intake/Output from previous day: 11/28 0701 - 11/29 0700 In: 62 [P.O.:720; I.V.:100] Out: 2475 [Urine:2450; Drains:15; Blood:10] Intake/Output from this shift: Total I/O In: -  Out: 300 [Urine:300]  Labs:  Recent Labs  12/22/14 0343 12/24/14 0518  WBC 18.6* 12.8*  HGB 13.7 13.9  PLT 208 264  CREATININE 1.67* 2.35*   Estimated Creatinine Clearance: 29.1 mL/min (by C-G formula based on Cr of 2.35).  Microbiology: Recent Results (from the past 720 hour(s))  Blood culture (routine x 2)     Status: None (Preliminary result)   Collection Time: 12/21/14  9:00 PM  Result Value Ref Range Status   Specimen Description BLOOD RIGHT ARM  Final   Special Requests BOTTLES DRAWN AEROBIC AND ANAEROBIC 5CC  Final   Culture NO GROWTH 3 DAYS  Final   Report Status PENDING  Incomplete  Blood culture (routine x 2)     Status: None (Preliminary result)   Collection Time: 12/21/14  9:06 PM  Result Value Ref Range Status   Specimen Description BLOOD RIGHT HAND  Final   Special Requests BOTTLES DRAWN AEROBIC AND ANAEROBIC 5CC  Final   Culture NO GROWTH 3 DAYS  Final   Report Status PENDING  Incomplete  Body fluid culture     Status: None (Preliminary result)   Collection Time: 12/22/14  4:15 PM  Result Value Ref Range Status   Specimen Description FLUID RIGHT GROIN  Final   Special Requests Normal  Final   Gram Stain   Final    MODERATE WBC PRESENT,BOTH PMN AND MONONUCLEAR NO  ORGANISMS SEEN    Culture NO GROWTH 2 DAYS  Final   Report Status PENDING  Incomplete  Culture, routine-abscess     Status: None (Preliminary result)   Collection Time: 12/23/14 11:26 AM  Result Value Ref Range Status   Specimen Description ABSCESS GROIN RIGHT  Final   Special Requests SPEC A PT ON ZOSYN AND VANCOMYCIN  Final   Gram Stain   Final    FEW WBC PRESENT, PREDOMINANTLY PMN NO SQUAMOUS EPITHELIAL CELLS SEEN NO ORGANISMS SEEN Performed at [facility]    Culture NO GROWTH Performed at [facility]   Final   Report Status PENDING  Incomplete  Anaerobic culture     Status: None (Preliminary result)   Collection Time: 12/23/14 11:29 AM  Result Value Ref Range Status   Specimen Description WOUND GROIN RIGHT  Final   Special Requests SPEC C PT ON ZOSYN,VANCOMYCIN  Final   Gram Stain   Final    RARE WBC PRESENT, PREDOMINANTLY PMN NO SQUAMOUS EPITHELIAL CELLS SEEN NO ORGANISMS SEEN Performed at [facility]    Culture PENDING  Incomplete   Report Status PENDING  Incomplete  Wound culture     Status: None (Preliminary result)   Collection Time: 12/23/14 11:29 AM  Result Value Ref Range Status   Specimen Description WOUND GROIN RIGHT  Final   Special Requests SPEC B PT ON  ZOSYN AND VANCOMYCIN  Final   Gram Stain   Final    RARE WBC PRESENT, PREDOMINANTLY PMN NO SQUAMOUS EPITHELIAL CELLS SEEN NO ORGANISMS SEEN Performed at [facility]    Culture NO GROWTH Performed at [facility]   Final   Report Status PENDING  Incomplete   Assessment: 55 YOM on D# 3 Vancomycin and Zosyn for groin wound infection.  POD#1 I&D, wound VAC in place.   This morning, half of the dose was given d/t BUN/Creatintne trending up.   Patient is being rehydrated and having good UOP. A vancomycin random level was checked tonight to ensure patient isn't supratherapeutic. Random level resulted in range at 69mcg/mL. This is close to a true trough since  patient received 2.5 full doses of vancomycin. Would expect a true trough (drawn before the 4th dose) would result in a therapeutic trough.  Goal of Therapy:  Vancomycin trough level 15-20 mcg/ml appropriate Zosyn dose for renal function and infection  Plan:  -Resume Vancomycin 1250mg  IV q24h starting 11/30 at 0900 -Continue Zosyn 3.375g IV q8h EI -follow antibiotic plans moving forward along with renal function and clinical progression  Keirah Konitzer D. Charise Leinbach, PharmD, BCPS Clinical Pharmacist Pager: [phone] 12/24/2014 9:15 PM

## 2014-12-24 NOTE — Progress Notes (Addendum)
[address]       [place], [address]             [phone]          1 Day Post-Op Procedure(s) (LRB): GROIN DEBRIDEMENT with wound VAC application (Right)  Subjective: Stable night, no complaints.   Objective: Vital signs in last 24 hours: Patient Vitals for the past 24 hrs:  BP Temp Temp src Pulse Resp SpO2  12/23/14 2125 (!) 99/57 mmHg 98.2 F (36.8 C) Oral 79 16 96 %  12/23/14 1800 111/61 mmHg 98.5 F (36.9 C) Oral 84 18 95 %  12/23/14 1400 113/73 mmHg 97.5 F (36.4 C) Oral 88 18 94 %  12/23/14 1330 109/73 mmHg 98 F (36.7 C) - 75 10 97 %  12/23/14 1315 102/68 mmHg - - 76 13 98 %  12/23/14 1300 102/71 mmHg - - 74 11 96 %  12/23/14 1245 103/72 mmHg - - 79 14 99 %  12/23/14 1230 100/71 mmHg - - 80 13 99 %  12/23/14 1215 - - - 80 13 99 %  12/23/14 1200 105/69 mmHg - - 80 19 97 %  12/23/14 1145 - - - 87 18 97 %  12/23/14 1140 100/67 mmHg 98 F (36.7 C) - 89 13 92 %   Current Weight  12/22/14 164 lb 3.9 oz (74.5 kg)     Intake/Output from previous day: 11/28 0701 - 11/29 0700 In: 69 [P.O.:720; I.V.:100] Out: 2475 [Urine:2450; Drains:15; Blood:10]    PHYSICAL EXAM:  Heart: RRR Lungs: Clear Wound: VAC in place R groin, no surrounding erythema     Lab Results: CBC: Recent Labs  12/22/14 0343 12/24/14 0518  WBC 18.6* 12.8*  HGB 13.7 13.9  HCT 41.6 42.2  PLT 208 264   BMET:  Recent Labs  12/22/14 0343 12/24/14 0518  NA 135 142  K 4.5 4.6  CL 101 105  CO2 26 31  GLUCOSE 113* 155*  BUN 18 24*  CREATININE 1.67* 2.35*  CALCIUM 8.4* 8.9    PT/INR:  Recent Labs  12/21/14 2010  LABPROT 15.2  INR 1.18      Assessment/Plan: S/P Procedure(s) (LRB): GROIN DEBRIDEMENT with wound VAC application (Right)  Wound stable, will start VAC changes in am.  ID- WBC trending down and pt afebrile.  The groin wound is presumably the source of his fevers at this point. Blood cx's negative to date. Will follow up  intraoperative cx's and blood cx's. Continue IV Vanc/Zosyn for now.  Dr. Roxy Manns will arrange TEE for today vs in am (depending on cardiology's schedule) to evaluate valves.  CKD- Cr up some today. Medicine following.  Disp- if blood cx's negative, may be able to d/c home in am on po abx.    LOS: 2 days    COLLINS,GINA H 12/24/2014  I have seen and examined the patient and agree with the assessment and plan as outlined.  No more fevers and WBC down to 12k.  All cultures remain negative so far.  I think the patient's right groin lymphocele remains the most likely source of infection.   I have personally reviewed the patient's transthoracic echocardiogram that was performed yesterday.  A question was raised about the appearance of the tricuspid valve and TEE has been suggested by the interpreting cardiologist.  Given that blood cultures remain negative so far the likelihood of endocarditis seems relatively low.  However, I discussed this with Mr Willimas and he  would like to proceed with TEE so that we can be more certain.  Will ask Cardiology to arrange for am tomorrow.  Assuming that blood cultures remain negative and TEE does not reveal findings suggestive of endocarditis we will tentatively plan for d/c home tomorrow with home health arrangements for home wound [place], MD 12/24/2014 8:46 AM

## 2014-12-24 NOTE — Care Management (Signed)
Ongoing efforts to obtain [facility], application started and info faxed to [facility] rep and prescription left for MD on 12/23/2014 along with multiples notes asking for a signature. This has not been signed at this time , so order will be delayed. Currently attempting to reach Dr Ricard Dillon or Ms Cavour. Message left with office nurse Thurmond Butts as this was the only option offered by the office. Per office both are in surgery.  Will continue to attempt to reach either provider.

## 2014-12-24 NOTE — Progress Notes (Signed)
[facility] PROGRESS NOTE  Jonathan Church [id number] DOB: 11-16-1945 DOA: 12/21/2014 PCP: London Pepper, MD  Interim summary Patient is a 69 year old gentleman with a past medical history of mitral valve prolapse with severe symptoms, undergoing minimally invasive mitral valve repair on 09/18/2014, procedure was performed by Dr. Roxy Manns of cardiothoracic surgery. Patient tolerated procedure well there are no immediate complications. He presented to the emergency department overnight with a 4 day history of fevers, chills, having a temperature 102 at home, noticing increase swelling involving his right growing. This site was accessed for his surgery back in August. Ultrasound revealed findings concerning for postoperative seroma. He was started on empiric IV antibiotic therapy with Vancomycin and Zosyn. Cardiothoracic surgery was consulted. On 12/23/2014 he underwent incisional debridement of right growing wound with application of wound vacuum. Transthoracic echocardiogram didn't show obvious vegetation however cardiology reporting that vegetation cannot be excluded on tricuspid valve for which a transesophageal echocardiogram has been ordered.  Assessment/Plan: 1. Postoperative lymphocele -Stetzer presenting with a 4 day history of fevers, chills, malaise, having a temperature 102 at home, with lab work revealing white count of 18,600. -He has noted increasing swelling over right groin region, which was ultrasound and revealed findings suggestive of postoperative seroma. -Given other presenting symptoms and lab work there as concern for underlying infectious process, particularly infected seroma. -Cardiothoracic surgery was consulted. -Will continue empiric IV antibiotic therapy with Vancomycin and Zosyn -Chest x-ray did not show evidence of pneumonia, urine looked clean, blood culture showing no growth after overnight incubation -Cardiothoracic surgery performing incisional debridement of  right groin wound with application of wound vacuum, procedure performed on 12/23/2014 by Dr. Roxy Manns. Patient tolerated procedure well. He remains afebrile, hemodynamically stable. Will continue empiric IV antibiotic therapy with vancomycin and Zosyn as wound cultures are pending. Blood culture showing no growth to date. -Transthoracic echocardiogram performed on 12/23/2014 did not reveal vegetation however cardiology reported that vegetation could not be fully excluded on tricuspid valve or which transesophageal echocardiogram was recommended.  -Plan for TEE on 12/25/2014. Will continue current IV antibiotic regimen until results from TEE are available.  2.  History of severe mitral regurgitation -Patient was taken to the operating room on 09/18/2014 undergoing minimally invasive mitral valve repair, procedure was performed by Dr. Roxy Manns of cardiothoracic surgery. -He denies shortness of breath, chest pain, extremity edema -He had a transthoracic echocardiogram done on 10/29/2014 that showed an EF of 50-55%, trivial regurgitation involving mitral valve, status post mitral valve repair with annuloplasty ring.  3. Acute on chronic renal failure  -Patient having history of stage III chronic kidney disease. -On 12/24/2014 his creatinine increased to 2.35 from 1.67 on previous day lab work. -He was given a bolus of normal saline and will continue running NS at 125 mL/hour for the next 24 hours.    Code Status: Full code Family Communication:  Disposition Plan: Cards thoracic surgery consulted, anticipate discharge in the next 24 hours if TEE is unremarkable   Consultants:  CT surgery  Antibiotics:  Vancomycin  Zosyn  HPI/Subjective: Patient states he is doing well, feels much better. Tolerating by mouth intake, denies fevers, chills.  Objective: Filed Vitals:   12/23/14 2125 12/24/14 1016  BP: 99/57 110/75  Pulse: 79 83  Temp: 98.2 F (36.8 C) 97.5 F (36.4 C)  Resp: 16 18     Intake/Output Summary (Last 24 hours) at 12/24/14 1632 Last data filed at 12/24/14 1305  Gross per 24 hour  Intake   1440 ml  Output   1590 ml  Net   -150 ml   Filed Weights   12/21/14 1942 12/22/14 0137 12/22/14 2100  Weight: 78.926 kg (174 lb) 74.435 kg (164 lb 1.6 oz) 74.5 kg (164 lb 3.9 oz)    Exam:   General:  Patient is nontoxic appearing, awake and alert conversive looking forward to going home tomorrow.  Cardiovascular: Regular rate and rhythm normal S1-S2  Respiratory: Normal respiratory effort, lungs clear to auscultation bilaterally  Abdomen: Soft nontender nondistended  Musculoskeletal: Status post incision and drainage of right groin wound, there is a decrease in size of mass, wound VAC in place.  Data Reviewed: Basic Metabolic Panel:  Recent Labs Lab 12/21/14 2010 12/22/14 0343 12/24/14 0518  NA 136 135 142  K 4.3 4.5 4.6  CL 100* 101 105  CO2 27 26 31   GLUCOSE 102* 113* 155*  BUN 18 18 24*  CREATININE 1.63* 1.67* 2.35*  CALCIUM 8.8* 8.4* 8.9   Liver Function Tests: No results for input(s): AST, ALT, ALKPHOS, BILITOT, PROT, ALBUMIN in the last 168 hours. No results for input(s): LIPASE, AMYLASE in the last 168 hours. No results for input(s): AMMONIA in the last 168 hours. CBC:  Recent Labs Lab 12/21/14 2010 12/22/14 0343 12/24/14 0518  WBC 16.4* 18.6* 12.8*  NEUTROABS 12.6*  --   --   HGB 14.5 13.7 13.9  HCT 43.7 41.6 42.2  MCV 96.0 96.1 95.7  PLT 199 208 264   Cardiac Enzymes: No results for input(s): CKTOTAL, CKMB, CKMBINDEX, TROPONINI in the last 168 hours. BNP (last 3 results) No results for input(s): BNP in the last 8760 hours.  ProBNP (last 3 results) No results for input(s): PROBNP in the last 8760 hours.  CBG:  Recent Labs Lab 12/23/14 0755 12/23/14 1054  GLUCAP 79 86    Recent Results (from the past 240 hour(s))  Blood culture (routine x 2)     Status: None (Preliminary result)   Collection Time: 12/21/14   9:00 PM  Result Value Ref Range Status   Specimen Description BLOOD RIGHT ARM  Final   Special Requests BOTTLES DRAWN AEROBIC AND ANAEROBIC 5CC  Final   Culture NO GROWTH 3 DAYS  Final   Report Status PENDING  Incomplete  Blood culture (routine x 2)     Status: None (Preliminary result)   Collection Time: 12/21/14  9:06 PM  Result Value Ref Range Status   Specimen Description BLOOD RIGHT HAND  Final   Special Requests BOTTLES DRAWN AEROBIC AND ANAEROBIC 5CC  Final   Culture NO GROWTH 3 DAYS  Final   Report Status PENDING  Incomplete  Body fluid culture     Status: None (Preliminary result)   Collection Time: 12/22/14  4:15 PM  Result Value Ref Range Status   Specimen Description FLUID RIGHT GROIN  Final   Special Requests Normal  Final   Gram Stain   Final    MODERATE WBC PRESENT,BOTH PMN AND MONONUCLEAR NO ORGANISMS SEEN    Culture NO GROWTH 2 DAYS  Final   Report Status PENDING  Incomplete  Culture, routine-abscess     Status: None (Preliminary result)   Collection Time: 12/23/14 11:26 AM  Result Value Ref Range Status   Specimen Description ABSCESS GROIN RIGHT  Final   Special Requests SPEC A PT ON ZOSYN AND VANCOMYCIN  Final   Gram Stain   Final    FEW WBC PRESENT, PREDOMINANTLY PMN NO SQUAMOUS EPITHELIAL CELLS SEEN NO ORGANISMS SEEN Performed  at [place] Performed at [facility]   Final   Report Status PENDING  Incomplete  Anaerobic culture     Status: None (Preliminary result)   Collection Time: 12/23/14 11:29 AM  Result Value Ref Range Status   Specimen Description WOUND GROIN RIGHT  Final   Special Requests SPEC C PT ON ZOSYN,VANCOMYCIN  Final   Gram Stain   Final    RARE WBC PRESENT, PREDOMINANTLY PMN NO SQUAMOUS EPITHELIAL CELLS SEEN NO ORGANISMS SEEN Performed at [facility]    Culture PENDING  Incomplete   Report Status PENDING  Incomplete  Wound culture     Status: None (Preliminary result)    Collection Time: 12/23/14 11:29 AM  Result Value Ref Range Status   Specimen Description WOUND GROIN RIGHT  Final   Special Requests SPEC B PT ON ZOSYN AND VANCOMYCIN  Final   Gram Stain   Final    RARE WBC PRESENT, PREDOMINANTLY PMN NO SQUAMOUS EPITHELIAL CELLS SEEN NO ORGANISMS SEEN Performed at [facility]    Culture NO GROWTH Performed at [facility]   Final   Report Status PENDING  Incomplete     Studies: No results found.  Scheduled Meds: . aspirin EC  81 mg Oral Daily  . docusate sodium  200 mg Oral Daily  . enoxaparin (LOVENOX) injection  30 mg Subcutaneous Q24H  . piperacillin-tazobactam (ZOSYN)  IV  3.375 g Intravenous 3 times per day  . sodium chloride  3 mL Intravenous Q12H   Continuous Infusions: . sodium chloride 125 mL/hr at 12/24/14 1213    Active Problems:   S/P MVR (mitral valve repair)   Fever and chills   Lymphocele after surgical procedure    Time spent: 30 min    Kelvin Cellar  [facility] Pager [phone]. If 7PM-7AM, please contact night-coverage at [email or web address], password [facility] 12/24/2014, 4:32 PM  LOS: 2 days

## 2014-12-24 NOTE — Care Management (Signed)
VAC prescription completed and prescription as well as OR notes, progression notes and orders faxed to [facility] @ 2:10 pm.  CRoyal RN MPH, case manager, [phone]

## 2014-12-24 NOTE — Progress Notes (Signed)
ANTIBIOTIC CONSULT NOTE - FOLLOW UP  Pharmacy Consult for Vancomycin and Zosyn Indication: wound infection  Allergies  Allergen Reactions  . Peanut-Containing Drug Products Hives and Itching  . Tape Rash    Rash from tape wrapped around arm after giving blood    Patient Measurements: Height: 5\' 8"  (172.7 cm) Weight: 164 lb 3.9 oz (74.5 kg) IBW/kg (Calculated) : 68.4  Vital Signs: Temp: 97.5 F (36.4 C) (11/29 1016) Temp Source: Oral (11/29 1016) BP: 110/75 mmHg (11/29 1016) Pulse Rate: 83 (11/29 1016) Intake/Output from previous day: 11/28 0701 - 11/29 0700 In: 63 [P.O.:720; I.V.:100] Out: 2475 [Urine:2450; Drains:15; Blood:10] Intake/Output from this shift: Total I/O In: 240 [P.O.:240] Out: -   Labs:  Recent Labs  12/21/14 2010 12/22/14 0343 12/24/14 0518  WBC 16.4* 18.6* 12.8*  HGB 14.5 13.7 13.9  PLT 199 208 264  CREATININE 1.63* 1.67* 2.35*   Estimated Creatinine Clearance: 29.1 mL/min (by C-G formula based on Cr of 2.35).  Microbiology: Recent Results (from the past 720 hour(s))  Blood culture (routine x 2)     Status: None (Preliminary result)   Collection Time: 12/21/14  9:00 PM  Result Value Ref Range Status   Specimen Description BLOOD RIGHT ARM  Final   Special Requests BOTTLES DRAWN AEROBIC AND ANAEROBIC 5CC  Final   Culture NO GROWTH 2 DAYS  Final   Report Status PENDING  Incomplete  Blood culture (routine x 2)     Status: None (Preliminary result)   Collection Time: 12/21/14  9:06 PM  Result Value Ref Range Status   Specimen Description BLOOD RIGHT HAND  Final   Special Requests BOTTLES DRAWN AEROBIC AND ANAEROBIC 5CC  Final   Culture NO GROWTH 2 DAYS  Final   Report Status PENDING  Incomplete  Body fluid culture     Status: None (Preliminary result)   Collection Time: 12/22/14  4:15 PM  Result Value Ref Range Status   Specimen Description FLUID RIGHT GROIN  Final   Special Requests Normal  Final   Gram Stain   Final    MODERATE WBC  PRESENT,BOTH PMN AND MONONUCLEAR NO ORGANISMS SEEN    Culture NO GROWTH < 24 HOURS  Final   Report Status PENDING  Incomplete  Culture, routine-abscess     Status: None (Preliminary result)   Collection Time: 12/23/14 11:26 AM  Result Value Ref Range Status   Specimen Description ABSCESS GROIN RIGHT  Final   Special Requests SPEC A PT ON ZOSYN AND VANCOMYCIN  Final   Gram Stain PENDING  Incomplete   Culture NO GROWTH Performed at [facility]   Final   Report Status PENDING  Incomplete  Wound culture     Status: None (Preliminary result)   Collection Time: 12/23/14 11:29 AM  Result Value Ref Range Status   Specimen Description WOUND GROIN RIGHT  Final   Special Requests SPEC B PT ON ZOSYN AND VANCOMYCIN  Final   Gram Stain PENDING  Incomplete   Culture NO GROWTH Performed at [facility]   Final   Report Status PENDING  Incomplete   Assessment:   Day # 3 Vancomycin and Zosyn for groin wound infection.  POD#1 I&D, wound VAC in place.  BUN/Creatintne have trended up.  IVF added back this am.  Patient reports good UOP.   Vanc 1250 mg IV given pre-op ~11am on 11/28.  3am dose had been missed due to clamp not released.   10am Vanc dose begun ~8:30am,  but dose paused after increased Scr known.   Zosyn dose remains appropriate.   Noted may go home on PO antibiotics on 11/30.  Goal of Therapy:  Vancomycin trough level 15-20 mcg/ml appropriate Zosyn dose for renal function and infection  Plan:    Hold Vanc 1250 mg IV q24hrs - will likely need lower dose while Scr up.   Check random Vanc level ~12 hrs after this morning's partial dose, to help guide timing for further Vanc doses.    Continue Zosyn 3.375 gm IV q8hrs (each over 4 hrs)   Bmet in am.   Follow up final cultures.  Arty Baumgartner, [place] Pager: [phone] 12/24/2014,11:02 AM

## 2014-12-25 ENCOUNTER — Encounter (HOSPITAL_COMMUNITY)
Admission: EM | Disposition: A | Discharge status: Home Health | Payer: Self-pay | Source: Home / Self Care | Attending: Internal Medicine

## 2014-12-25 ENCOUNTER — Ambulatory Visit (HOSPITAL_COMMUNITY): Payer: Medicare Other

## 2014-12-25 ENCOUNTER — Encounter (HOSPITAL_COMMUNITY): Payer: Self-pay

## 2014-12-25 DIAGNOSIS — I898 Other specified noninfective disorders of lymphatic vessels and lymph nodes: Secondary | ICD-10-CM

## 2014-12-25 DIAGNOSIS — R509 Fever, unspecified: Secondary | ICD-10-CM

## 2014-12-25 DIAGNOSIS — Z9889 Other specified postprocedural states: Secondary | ICD-10-CM

## 2014-12-25 DIAGNOSIS — I361 Nonrheumatic tricuspid (valve) insufficiency: Secondary | ICD-10-CM

## 2014-12-25 HISTORY — PX: TEE WITHOUT CARDIOVERSION: SHX5443

## 2014-12-25 LAB — CBC
HEMATOCRIT: 40.8 % (ref 39.0–52.0)
Hemoglobin: 12.7 g/dL — ABNORMAL LOW (ref 13.0–17.0)
MCH: 30.5 pg (ref 26.0–34.0)
MCHC: 31.1 g/dL (ref 30.0–36.0)
MCV: 97.8 fL (ref 78.0–100.0)
PLATELETS: 290 10*3/uL (ref 150–400)
RBC: 4.17 MIL/uL — ABNORMAL LOW (ref 4.22–5.81)
RDW: 13.2 % (ref 11.5–15.5)
WBC: 9.8 10*3/uL (ref 4.0–10.5)

## 2014-12-25 LAB — WOUND CULTURE: CULTURE: NO GROWTH

## 2014-12-25 LAB — BASIC METABOLIC PANEL
Anion gap: 5 (ref 5–15)
BUN: 20 mg/dL (ref 6–20)
CALCIUM: 8.6 mg/dL — AB (ref 8.9–10.3)
CO2: 31 mmol/L (ref 22–32)
CREATININE: 2.51 mg/dL — AB (ref 0.61–1.24)
Chloride: 111 mmol/L (ref 101–111)
GFR calc Af Amer: 29 mL/min — ABNORMAL LOW (ref >60)
GFR, EST NON AFRICAN AMERICAN: 25 mL/min — AB (ref >60)
GLUCOSE: 85 mg/dL (ref 65–99)
Potassium: 4.5 mmol/L (ref 3.5–5.1)
Sodium: 147 mmol/L — ABNORMAL HIGH (ref 135–145)

## 2014-12-25 LAB — GLUCOSE, CAPILLARY: Glucose-Capillary: 93 mg/dL (ref 65–99)

## 2014-12-25 SURGERY — ECHOCARDIOGRAM, TRANSESOPHAGEAL
Anesthesia: Moderate Sedation

## 2014-12-25 MED ORDER — FENTANYL CITRATE (PF) 100 MCG/2ML IJ SOLN
INTRAMUSCULAR | Status: DC | PRN
  Administered 2014-12-25: 50 ug via INTRAVENOUS
  Administered 2014-12-25: 25 ug via INTRAVENOUS

## 2014-12-25 MED ORDER — FENTANYL CITRATE (PF) 100 MCG/2ML IJ SOLN
INTRAMUSCULAR | Status: AC
  Filled 2014-12-25: qty 2

## 2014-12-25 MED ORDER — DOXYCYCLINE HYCLATE 100 MG PO TABS
100.0000 mg | ORAL_TABLET | Freq: Two times a day (BID) | ORAL | Status: DC
  Administered 2014-12-25 (×2): 100 mg via ORAL
  Filled 2014-12-25 (×3): qty 1

## 2014-12-25 MED ORDER — SODIUM CHLORIDE 0.9 % IV SOLN: INTRAVENOUS | Status: DC

## 2014-12-25 MED ORDER — MIDAZOLAM HCL 10 MG/2ML IJ SOLN
INTRAMUSCULAR | Status: DC | PRN
  Administered 2014-12-25 (×2): 2 mg via INTRAVENOUS

## 2014-12-25 MED ORDER — BUTAMBEN-TETRACAINE-BENZOCAINE 2-2-14 % EX AERO
INHALATION_SPRAY | CUTANEOUS | Status: DC | PRN
  Administered 2014-12-25: 2 via TOPICAL

## 2014-12-25 MED ORDER — AMOXICILLIN-POT CLAVULANATE 500-125 MG PO TABS
1.0000 | ORAL_TABLET | Freq: Two times a day (BID) | ORAL | Status: DC
  Administered 2014-12-25 – 2014-12-26 (×2): 500 mg via ORAL
  Filled 2014-12-25 (×2): qty 1

## 2014-12-25 MED ORDER — MIDAZOLAM HCL 5 MG/ML IJ SOLN
INTRAMUSCULAR | Status: AC
  Filled 2014-12-25: qty 2

## 2014-12-25 NOTE — Progress Notes (Addendum)
       [address]       [place] [address]             [phone]          2 Days Post-Op Procedure(s) (LRB): GROIN DEBRIDEMENT with wound VAC application (Right)  Subjective: No complaints this am except mild nausea from being NPO.   Objective: Vital signs in last 24 hours: Patient Vitals for the past 24 hrs:  BP Temp Temp src Pulse Resp SpO2 Weight  12/25/14 0515 105/76 mmHg 98.3 F (36.8 C) Oral 69 17 96 % -  12/24/14 2052 104/74 mmHg 97.4 F (36.3 C) Oral 78 18 98 % 165 lb (74.844 kg)  12/24/14 1819 113/76 mmHg 97.5 F (36.4 C) Oral 75 18 95 % -  12/24/14 1016 110/75 mmHg 97.5 F (36.4 C) Oral 83 18 98 % -   Current Weight  12/24/14 165 lb (74.844 kg)     Intake/Output from previous day: 11/29 0701 - 11/30 0700 In: 4280.4 [P.O.:1320; I.V.:2960.4] Out: 735 [Urine:675; Drains:60]    PHYSICAL EXAM:  Heart: RRR, no murmur Lungs: Clear Wound: R groin with VAC in place, no surrounding erythema     Lab Results: CBC: Recent Labs  12/24/14 0518 12/25/14 0644  WBC 12.8* 9.8  HGB 13.9 12.7*  HCT 42.2 40.8  PLT 264 290   BMET:  Recent Labs  12/24/14 0518  NA 142  K 4.6  CL 105  CO2 31  GLUCOSE 155*  BUN 24*  CREATININE 2.35*  CALCIUM 8.9    PT/INR: No results for input(s): LABPROT, INR in the last 72 hours.    Assessment/Plan: S/P Procedure(s) (LRB): GROIN DEBRIDEMENT with wound VAC application (Right) ID- remains afebrile, WBC continues to trend down. Continue Vanc/Zosyn for now. All cultures negative to date.   Groin wound stable, for VAC change today by wound care RN. For TEE today to evaluate valves, R/o endocarditis. If TEE and cx's ok, should be able to discharge patient home later today on po antibiotics and HHRN for VAC changes.   LOS: 3 days    COLLINS,GINA H 12/25/2014  I have seen and examined the patient and agree with the assessment and plan as outlined.  I have personally reviewed Mr. Trembley's TEE  performed earlier today.  There are no findings suggestive of endocarditis.  Specifically, there are no signs of vegetation on any of his valves.  Mitral valve repair and LV function both look good.  Both Vancomycin and Zosyn were stopped earlier today and doxycycline started.  I am not confident that doxycycline is adequate and I think he needs at least a week's course of broad spectrum antibiotics to treat his presumed infected right groin lymphocele.  Will start oral Augmentin.  Once his renal function returns to baseline I think he could be sent home.  [name], MD 12/25/2014 4:43 PM

## 2014-12-25 NOTE — CV Procedure (Signed)
TEE: 4 mg versed 75 ug fentanyl  Stable appearing MV repair with no significant MR Normal AV TV with mild prolapse and moderate TR  No vegetation or SBE  TTE images showed pseudo mass Below valve from redundant chordal coaptation Normal EF 60% No effusion No LAA thrombus Normal PV  Jenkins Rouge

## 2014-12-25 NOTE — Progress Notes (Signed)
  Echocardiogram Echocardiogram Transesophageal has been performed.  Jonathan Church 12/25/2014, 10:36 AM

## 2014-12-25 NOTE — Consult Note (Addendum)
WOC wound consult note Reason for Consult: Consult requested for Vac dressing change to right groin.   Wound type: Full thickness post-op wound Measurement: 5X1.5X3.5cm Wound bed: Beefy red Drainage (amount, consistency, odor) Mod amt pink drainage in the cannister, no odor Periwound: Intact skin surrounding Dressing procedure/placement/frequency: Removed 2 pieces black sponge and replaced with one piece black sponge to 137mm cont suction.  Pt tolerated with minimal amt discomfort and declined offer of pain meds.  Plans to discharge today when home Vac machine delivered. Please re-consult if further assistance is needed.  Thank-you,  Julien Girt MSN, [place], [place], [place], [place]

## 2014-12-25 NOTE — Progress Notes (Signed)
[facility] PROGRESS NOTE  ADNREW Church [id number] DOB: [birth year] DOA: 12/21/2014 PCP: London Pepper, MD  Interim summary Patient is a 69 year old gentleman with a past medical history of mitral valve prolapse with severe symptoms, undergoing minimally invasive mitral valve repair on 09/18/2014, procedure was performed by Dr. Roxy Manns of cardiothoracic surgery. Patient tolerated procedure well there are no immediate complications. He presented to the emergency department overnight with a 4 day history of fevers, chills, having a temperature 102 at home, noticing increase swelling involving his right growing. This site was accessed for his surgery back in August. Ultrasound revealed findings concerning for postoperative seroma. He was started on empiric IV antibiotic therapy with Vancomycin and Zosyn. Cardiothoracic surgery was consulted. On 12/23/2014 he underwent incisional debridement of right growing wound with application of wound vacuum. Transthoracic echocardiogram didn't show obvious vegetation however cardiology reporting that vegetation cannot be excluded on tricuspid valve for which a transesophageal echocardiogram has been ordered. TEE no vegetations, valve withoit sig MR  Assessment/Plan: 1. Postoperative infected lymphocele -Cardiothoracic surgery was consulted. -treated with empiric IV antibiotic therapy with Vancomycin and Zosyn -Cardiothoracic surgery performed incisional debridement of right groin wound with application of wound vacuum, procedure performed on 12/23/2014 by Dr. Roxy Manns.  -wound cultures are pending. Blood culture showing no growth to date. -Transthoracic echocardiogram performed on 12/23/2014 did not reveal vegetation however cardiology reported that vegetation could not be fully excluded on tricuspid valve, hence transesophageal echocardiogram was recommended.  -s/p TEE today, no vegetations, valve unremarkable  2.  History of severe mitral  regurgitation -09/18/2014 s/p minimally invasive mitral valve repair, procedure was performed by Dr. Roxy Manns of cardiothoracic surgery. -stable -He had a transthoracic echocardiogram done on 10/29/2014 that showed an EF of 50-55%, trivial regurgitation involving mitral valve, status post mitral valve repair with annuloplasty ring.  3. Acute on chronic renal failure  -Patient having history of stage III chronic kidney disease, baseline 1.6 -his creatinine increased to 2.5 from 1.67 , suspect Vanc related, stop this,  -continue IVF, urine output good, Stop Vanc and change to Doxy -bmet in am  DVt proph: lovenox  Code Status: Full code Family Communication: wife Disposition Plan:home tomorrow if creatinine stable or better  Consultants:  CT surgery  Antibiotics:  Vancomycin  Zosyn  HPI/Subjective: Patient states he is doing well, feels much better, anxious to go home  Objective: Filed Vitals:   12/25/14 1030 12/25/14 1036  BP: 84/60 92/55  Pulse: 79 73  Temp:    Resp: 9 15    Intake/Output Summary (Last 24 hours) at 12/25/14 1314 Last data filed at 12/25/14 1242  Gross per 24 hour  Intake 3800.42 ml  Output   1835 ml  Net 1965.42 ml   Filed Weights   12/22/14 0137 12/22/14 2100 12/24/14 2052  Weight: 74.435 kg (164 lb 1.6 oz) 74.5 kg (164 lb 3.9 oz) 74.844 kg (165 lb)    Exam:   General:  AAOx3  Cardiovascular: Regular rate and rhythm normal S1-S2  Respiratory: Normal respiratory effort, lungs clear to auscultation bilaterally  Abdomen: Soft nontender nondistended  Musculoskeletal: Status post incision and drainage of right groin wound, wound VAC in place.  Data Reviewed: Basic Metabolic Panel:  Recent Labs Lab 12/21/14 2010 12/22/14 0343 12/24/14 0518 12/25/14 0644  NA 136 135 142 147*  K 4.3 4.5 4.6 4.5  CL 100* 101 105 111  CO2 27 26 31 31   GLUCOSE 102* 113* 155* 85  BUN 18 18 24* 20  CREATININE  1.63* 1.67* 2.35* 2.51*  CALCIUM 8.8* 8.4*  8.9 8.6*   Liver Function Tests: No results for input(s): AST, ALT, ALKPHOS, BILITOT, PROT, ALBUMIN in the last 168 hours. No results for input(s): LIPASE, AMYLASE in the last 168 hours. No results for input(s): AMMONIA in the last 168 hours. CBC:  Recent Labs Lab 12/21/14 2010 12/22/14 0343 12/24/14 0518 12/25/14 0644  WBC 16.4* 18.6* 12.8* 9.8  NEUTROABS 12.6*  --   --   --   HGB 14.5 13.7 13.9 12.7*  HCT 43.7 41.6 42.2 40.8  MCV 96.0 96.1 95.7 97.8  PLT 199 208 264 290   Cardiac Enzymes: No results for input(s): CKTOTAL, CKMB, CKMBINDEX, TROPONINI in the last 168 hours. BNP (last 3 results) No results for input(s): BNP in the last 8760 hours.  ProBNP (last 3 results) No results for input(s): PROBNP in the last 8760 hours.  CBG:  Recent Labs Lab 12/23/14 0755 12/23/14 1054 12/25/14 0732  GLUCAP 79 86 93    Recent Results (from the past 240 hour(s))  Blood culture (routine x 2)     Status: None (Preliminary result)   Collection Time: 12/21/14  9:00 PM  Result Value Ref Range Status   Specimen Description BLOOD RIGHT ARM  Final   Special Requests BOTTLES DRAWN AEROBIC AND ANAEROBIC 5CC  Final   Culture NO GROWTH 3 DAYS  Final   Report Status PENDING  Incomplete  Blood culture (routine x 2)     Status: None (Preliminary result)   Collection Time: 12/21/14  9:06 PM  Result Value Ref Range Status   Specimen Description BLOOD RIGHT HAND  Final   Special Requests BOTTLES DRAWN AEROBIC AND ANAEROBIC 5CC  Final   Culture NO GROWTH 3 DAYS  Final   Report Status PENDING  Incomplete  Body fluid culture     Status: None (Preliminary result)   Collection Time: 12/22/14  4:15 PM  Result Value Ref Range Status   Specimen Description FLUID RIGHT GROIN  Final   Special Requests Normal  Final   Gram Stain   Final    MODERATE WBC PRESENT,BOTH PMN AND MONONUCLEAR NO ORGANISMS SEEN    Culture NO GROWTH 3 DAYS  Final   Report Status PENDING  Incomplete  Culture,  routine-abscess     Status: None (Preliminary result)   Collection Time: 12/23/14 11:26 AM  Result Value Ref Range Status   Specimen Description ABSCESS GROIN RIGHT  Final   Special Requests SPEC A PT ON ZOSYN AND VANCOMYCIN  Final   Gram Stain   Final    FEW WBC PRESENT, PREDOMINANTLY PMN NO SQUAMOUS EPITHELIAL CELLS SEEN NO ORGANISMS SEEN Performed at [facility]    Culture   Final    NO GROWTH 2 DAYS Performed at [facility]    Report Status PENDING  Incomplete  Anaerobic culture     Status: None (Preliminary result)   Collection Time: 12/23/14 11:29 AM  Result Value Ref Range Status   Specimen Description WOUND GROIN RIGHT  Final   Special Requests SPEC C PT ON ZOSYN,VANCOMYCIN  Final   Gram Stain   Final    RARE WBC PRESENT, PREDOMINANTLY PMN NO SQUAMOUS EPITHELIAL CELLS SEEN NO ORGANISMS SEEN Performed at [facility]    Culture   Final    NO ANAEROBES ISOLATED; CULTURE IN PROGRESS FOR 5 DAYS Performed at [facility]    Report Status PENDING  Incomplete  Wound culture  Status: None   Collection Time: 12/23/14 11:29 AM  Result Value Ref Range Status   Specimen Description WOUND GROIN RIGHT  Final   Special Requests SPEC B PT ON ZOSYN AND VANCOMYCIN  Final   Gram Stain   Final    RARE WBC PRESENT, PREDOMINANTLY PMN NO SQUAMOUS EPITHELIAL CELLS SEEN NO ORGANISMS SEEN Performed at [facility]    Culture   Final    NO GROWTH 2 DAYS Performed at [facility]    Report Status 12/25/2014 FINAL  Final     Studies: No results found.  Scheduled Meds: . aspirin EC  81 mg Oral Daily  . docusate sodium  200 mg Oral Daily  . doxycycline  100 mg Oral Q12H  . enoxaparin (LOVENOX) injection  30 mg Subcutaneous Q24H  . sodium chloride  3 mL Intravenous Q12H   Continuous Infusions: . sodium chloride 1,000 mL (12/24/14 2019)    Active Problems:   S/P MVR (mitral valve repair)   Fever and chills   Lymphocele  after surgical procedure    Time spent: 30 min    [facility]  [facility] Pager [phone]. If 7PM-7AM, please contact night-coverage at [email or web address], password [facility] 12/25/2014, 1:14 PM  LOS: 3 days

## 2014-12-25 NOTE — Interval H&P Note (Signed)
History and Physical Interval Note:  12/25/2014 8:29 AM  Jonathan Church  has presented today for surgery, with the diagnosis of BACTEREMIA  The various methods of treatment have been discussed with the patient and family. After consideration of risks, benefits and other options for treatment, the patient has consented to  Procedure(s): TRANSESOPHAGEAL ECHOCARDIOGRAM (TEE) (N/A) as a surgical intervention .  The patient's history has been reviewed, patient examined, no change in status, stable for surgery.  I have reviewed the patient's chart and labs.  Questions were answered to the patient's satisfaction.     Jenkins Rouge

## 2014-12-26 ENCOUNTER — Encounter (HOSPITAL_COMMUNITY): Payer: Self-pay | Admitting: Cardiovascular Disease

## 2014-12-26 DIAGNOSIS — R1900 Intra-abdominal and pelvic swelling, mass and lump, unspecified site: Secondary | ICD-10-CM

## 2014-12-26 LAB — BODY FLUID CULTURE
CULTURE: NO GROWTH
SPECIAL REQUESTS: NORMAL

## 2014-12-26 LAB — CBC
HCT: 37.3 % — ABNORMAL LOW (ref 39.0–52.0)
HEMOGLOBIN: 11.9 g/dL — AB (ref 13.0–17.0)
MCH: 31.2 pg (ref 26.0–34.0)
MCHC: 31.9 g/dL (ref 30.0–36.0)
MCV: 97.6 fL (ref 78.0–100.0)
Platelets: 285 10*3/uL (ref 150–400)
RBC: 3.82 MIL/uL — ABNORMAL LOW (ref 4.22–5.81)
RDW: 13.1 % (ref 11.5–15.5)
WBC: 8.1 10*3/uL (ref 4.0–10.5)

## 2014-12-26 LAB — CULTURE, BLOOD (ROUTINE X 2)
CULTURE: NO GROWTH
CULTURE: NO GROWTH

## 2014-12-26 LAB — BASIC METABOLIC PANEL
ANION GAP: 5 (ref 5–15)
BUN: 16 mg/dL (ref 6–20)
CALCIUM: 8.4 mg/dL — AB (ref 8.9–10.3)
CHLORIDE: 109 mmol/L (ref 101–111)
CO2: 30 mmol/L (ref 22–32)
Creatinine, Ser: 2.02 mg/dL — ABNORMAL HIGH (ref 0.61–1.24)
GFR calc Af Amer: 37 mL/min — ABNORMAL LOW (ref >60)
GFR calc non Af Amer: 32 mL/min — ABNORMAL LOW (ref >60)
GLUCOSE: 93 mg/dL (ref 65–99)
Potassium: 4.4 mmol/L (ref 3.5–5.1)
Sodium: 144 mmol/L (ref 135–145)

## 2014-12-26 LAB — GLUCOSE, CAPILLARY: GLUCOSE-CAPILLARY: 96 mg/dL (ref 65–99)

## 2014-12-26 LAB — CULTURE, ROUTINE-ABSCESS: Culture: NO GROWTH

## 2014-12-26 MED ORDER — OXYCODONE HCL 5 MG PO TABS: 5.0000 mg | ORAL_TABLET | Freq: Four times a day (QID) | ORAL | Status: DC | PRN

## 2014-12-26 MED ORDER — AMOXICILLIN-POT CLAVULANATE 500-125 MG PO TABS: 1.0000 | ORAL_TABLET | Freq: Two times a day (BID) | ORAL | Status: DC

## 2014-12-26 MED ORDER — DOXYCYCLINE HYCLATE 100 MG PO TABS: 100.0000 mg | ORAL_TABLET | Freq: Two times a day (BID) | ORAL | Status: DC

## 2014-12-26 MED ORDER — SENNOSIDES-DOCUSATE SODIUM 8.6-50 MG PO TABS: 1.0000 | ORAL_TABLET | Freq: Every evening | ORAL | Status: DC | PRN

## 2014-12-26 NOTE — Consult Note (Signed)
WOC wound follow up Consult requested for Vac machine troubleshooting.   Machine reading "blockage" and cannister has mod amt condensation at the top which impairs the suction ability.  Dressing seal intact.  50cc pink drainage in cannister before changing machine. Pt preparing to discharge home today.  Placed on home Vac machine to 183mm cont suction and discussed troubleshooting alarms.  Pt plans to be followed by home health for dressing change assistance and denies further questions at this time. Please re-consult if further assistance is needed.  Thank-you,  Julien Girt MSN, [place], [place], [place], [place]

## 2014-12-26 NOTE — Progress Notes (Signed)
Princella Pellegrini to be D/C'd Home per MD order.  Discussed prescriptions and follow up appointments with the patient. Prescriptions given to patient, medication list explained in detail. Pt verbalized understanding.    Medication List    TAKE these medications        acetaminophen 500 MG tablet  Commonly known as:  TYLENOL  Take 1,000 mg by mouth every 6 (six) hours as needed for fever.     amoxicillin-clavulanate 500-125 MG tablet  Commonly known as:  AUGMENTIN  Take 1 tablet (500 mg total) by mouth every 12 (twelve) hours. For 5 days     aspirin 81 MG EC tablet  Take 1 tablet (81 mg total) by mouth daily.     doxycycline 100 MG tablet  Commonly known as:  VIBRA-TABS  Take 1 tablet (100 mg total) by mouth every 12 (twelve) hours. For 5 days     ondansetron 8 MG disintegrating tablet  Commonly known as:  ZOFRAN ODT  Take 1 tablet (8 mg total) by mouth every 8 (eight) hours as needed for nausea or vomiting.     OVER THE COUNTER MEDICATION  Take 80,000-240,000 Units by mouth 3 (three) times a week. Serra peptase (1 capsule 80,000 units)  - protein destroying enzymes for inflammation and cholesterol     oxyCODONE 5 MG immediate release tablet  Commonly known as:  Oxy IR/ROXICODONE  Take 1 tablet (5 mg total) by mouth every 6 (six) hours as needed for moderate pain.     senna-docusate 8.6-50 MG tablet  Commonly known as:  Senokot-S  Take 1 tablet by mouth at bedtime as needed for mild constipation.        Filed Vitals:   12/26/14 0425 12/26/14 0900  BP: 110/65 122/71  Pulse: 79 75  Temp: 98.5 F (36.9 C) 98.4 F (36.9 C)  Resp: 16 16    Skin clean, dry and intact without evidence of skin break down, no evidence of skin tears noted. IV catheter discontinued intact. Site without signs and symptoms of complications. Dressing and pressure applied. Pt denies pain at this time. No complaints noted.  An After Visit Summary was printed and given to the patient. Patient  escorted via [place], and D/C home via private auto.  Beck Cofer A 12/26/2014 12:31 PM

## 2014-12-26 NOTE — Progress Notes (Signed)
       [address]       [place] [address]             [phone]          1 Day Post-Op Procedure(s) (LRB): TRANSESOPHAGEAL ECHOCARDIOGRAM (TEE) (N/A)  Subjective: Stable, no new complaints.   Objective: Vital signs in last 24 hours: Patient Vitals for the past 24 hrs:  BP Temp Temp src Pulse Resp SpO2 Weight  12/26/14 0425 110/65 mmHg 98.5 F (36.9 C) Oral 79 16 94 % -  12/25/14 2053 119/69 mmHg 97.8 F (36.6 C) Oral 74 17 97 % 164 lb 10.9 oz (74.7 kg)  12/25/14 1535 111/61 mmHg 97.9 F (36.6 C) Oral 75 18 98 % -  12/25/14 1036 (!) 92/55 mmHg - - 73 15 94 % -  12/25/14 1030 (!) 84/60 mmHg - - 79 (!) 9 92 % -  12/25/14 1020 (!) 94/50 mmHg - - 73 13 98 % -  12/25/14 1019 (!) 94/50 mmHg 98 F (36.7 C) Oral 74 14 97 % -  12/25/14 1010 (!) 92/46 mmHg - - 66 11 98 % -  12/25/14 1005 (!) 87/49 mmHg - - 73 13 93 % -  12/25/14 1000 (!) 100/47 mmHg - - 65 11 97 % -  12/25/14 0955 (!) 113/50 mmHg - - 74 18 99 % -  12/25/14 0940 113/65 mmHg - - 69 (!) 24 99 % -  12/25/14 0935 120/69 mmHg - - 75 16 93 % -  12/25/14 0907 122/71 mmHg 98.1 F (36.7 C) Oral 68 11 96 % -   Current Weight  12/25/14 164 lb 10.9 oz (74.7 kg)     Intake/Output from previous day: 11/30 0701 - 12/01 0700 In: 3665.8 [P.O.:1080; I.V.:2585.8] Out: 4525 [Urine:4525]    PHYSICAL EXAM:  Heart: RRR Lungs: Clear Wound: R groin with VAC in place, no surrounding erythema     Lab Results: CBC: Recent Labs  12/25/14 0644 12/26/14 0643  WBC 9.8 8.1  HGB 12.7* 11.9*  HCT 40.8 37.3*  PLT 290 285   BMET:  Recent Labs  12/25/14 0644 12/26/14 0643  NA 147* 144  K 4.5 4.4  CL 111 109  CO2 31 30  GLUCOSE 85 93  BUN 20 16  CREATININE 2.51* 2.02*  CALCIUM 8.6* 8.4*    PT/INR: No results for input(s): LABPROT, INR in the last 72 hours.    Assessment/Plan: S/P Procedure(s) (LRB): TRANSESOPHAGEAL ECHOCARDIOGRAM (TEE) (N/A) ID- afebrile, WBC normal. Cultures remain  negative. TEE with no evidence of endocarditis.  Continue po Augmentin. R groin wound- VAC changed yesterday and wound granulating well.  Continue VAC changes MWF. Acute/CKD- Cr trending down, likely related to abx. Continue to monitor. Disp- home when ok with medical service. Will arrange outpatient followup.   LOS: 4 days    Shatia Sindoni H 12/26/2014

## 2014-12-26 NOTE — Care Management Note (Signed)
Case Management Note  Patient Details  Name: QUIRINO RENNA MRN: [id number] Date of Birth: 1945/12/19  Subjective/Objective:           CM following for progression and d/c planning.         Action/Plan: 12/26/2014 Pt for d/c today to home with VAC in place and ongoing VAC care and dressing changes to be provided by [facility]. [place] notified of plan to d/c today and dressing change schedule.   Expected Discharge Date:     12/26/2014             Expected Discharge Plan:  [place]  In-House Referral:  NA  Discharge planning Services  CM Consult  Post Acute Care Choice:  Durable Medical Equipment, Home Health Choice offered to:  Patient  DME Arranged:  Vac DME Agency:  [facility]  HH Arranged:  RN [place] Agency:  [facility]  Status of Service:  Completed, signed off  Medicare Important Message Given:    Date Medicare IM Given:    Medicare IM give by:    Date Additional Medicare IM Given:    Additional Medicare Important Message give by:     If discussed at [place] of Stay Meetings, dates discussed:    Additional Comments:  Adron Bene, RN 12/26/2014, 10:18 AM

## 2014-12-26 NOTE — Discharge Summary (Signed)
Physician Discharge Summary  Jonathan Church [id number] DOB: 1945/10/02 DOA: 12/21/2014  PCP: London Pepper, MD  Admit date: 12/21/2014 Discharge date: 12/26/2014  Time spent: 45 minutes  Recommendations for Outpatient Follow-up:  1. 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 Roxy Manns of cardiothoracic surgery. Patient tolerated procedure well there are no immediate complications. He presented to the emergency department overnight with a 4 day history of fevers, chills, having a temperature 102 at home, noticing increase swelling involving his right growing. This site was accessed for his surgery back in August.   Hospital Course:  1. Postoperative infected lymphocele -Cardiothoracic surgery was consulted. -treated with empiric IV antibiotic therapy with Vancomycin and Zosyn -Cardiothoracic surgery consulted and performed incisional debridement of right groin wound with application of wound vacuum, procedure performed on 12/23/2014 by Dr. Roxy Manns.  -wound cultures NGTD. Blood culture showing no growth to date. -Transthoracic echocardiogram performed on 12/23/2014 did not reveal vegetation however cardiology reported that vegetation could not be fully excluded on tricuspid valve, hence transesophageal echocardiogram was recommended.  -s/p TEE 11/30, no  vegetations, valve unremarkable -changed to Augmentin and Doxy at discharge  2. History of severe mitral regurgitation -09/18/2014 s/p minimally invasive mitral valve repair, procedure was performed by Dr. Roxy Manns of cardiothoracic surgery. -stable -He had a transthoracic echocardiogram done on 10/29/2014 that showed an EF of 50-55%, trivial regurgitation involving mitral valve, status post mitral valve repair with annuloplasty ring.  3. Acute on chronic renal failure  -Patient having history of stage III chronic kidney disease, baseline 1.6 -his creatinine increased to 2.5 from 1.67  -hydrated with NS, vancomycin stopped, creatinine improved to 2.0 at discharge  Procedures: Incisional debridement of right groin wound with application of wound vacuum, procedure performed on 12/23/2014 by Dr. Roxy Manns  Consultations:  CVTS Dr.Owen  Discharge Exam: Filed Vitals:   12/26/14 0425 12/26/14 0900  BP: 110/65 122/71  Pulse: 79 75  Temp: 98.5 F (36.9 C) 98.4 F (36.9 C)  Resp: 16 16    General: AAOx3 Cardiovascular: S1S2/RRR Respiratory: CTAB  Discharge Instructions   Discharge Instructions    Diet - low sodium heart healthy    Complete by:  As directed      Increase activity slowly    Complete by:  As directed           Discharge Medication List as of 12/26/2014 11:57 AM    START taking these medications   Details  amoxicillin-clavulanate (AUGMENTIN) 500-125 MG tablet Take 1 tablet (500 mg total) by mouth every 12 (twelve) hours. For 5 days, Starting 12/26/2014, Until Discontinued, Normal    doxycycline (VIBRA-TABS) 100 MG tablet Take 1 tablet (100 mg total) by mouth every 12 (twelve) hours. For 5 days, Starting 12/26/2014, Until Discontinued, Normal    oxyCODONE (OXY IR/ROXICODONE) 5 MG immediate release tablet Take 1  tablet (5 mg total) by mouth every 6 (six) hours as needed for moderate pain., Starting 12/26/2014, Until Discontinued, Print    senna-docusate (SENOKOT-S)  8.6-50 MG tablet Take 1 tablet by mouth at bedtime as needed for mild constipation., Starting 12/26/2014, Until Discontinued, Normal      CONTINUE these medications which have NOT CHANGED   Details  acetaminophen (TYLENOL) 500 MG tablet Take 1,000 mg by mouth every 6 (six) hours as needed for fever., Until Discontinued, Historical Med    aspirin EC 81 MG EC tablet Take 1 tablet (81 mg total) by mouth daily., Starting 09/22/2014, Until Discontinued, No Print    OVER THE COUNTER MEDICATION Take 80,000-240,000 Units by mouth 3 (three) times a week. Serra peptase (1 capsule 80,000 units)  - protein destroying enzymes for inflammation and cholesterol, Until Discontinued, Historical Med    ondansetron (ZOFRAN ODT) 8 MG disintegrating tablet Take 1 tablet (8 mg total) by mouth every 8 (eight) hours as needed for nausea or vomiting., Starting 10/22/2014, Until Discontinued, Print       Allergies  Allergen Reactions  . Peanut-Containing Drug Products Hives and Itching  . Tape Rash    Rash from tape wrapped around arm after giving blood   Follow-up Information    Follow up with [name], MD.   Specialty:  Cardiothoracic Surgery   Why:  Office will contact you with an appointment   Contact information:   [address] [place] [address] [phone]        The results of significant diagnostics from this hospitalization (including imaging, microbiology, ancillary and laboratory) are listed below for reference.    Significant Diagnostic Studies: Dg Chest 2 View  12/21/2014  CLINICAL DATA:  Chills and fever starting 2 days ago. Swelling to the right groin. EXAM: CHEST  2 VIEW COMPARISON:  10/22/2014 FINDINGS: Mild cardiac enlargement without vascular congestion. Probable cardiac valve prosthesis. Elevation of the right hemidiaphragm with atelectasis in the right lung base. Appearance is similar to previous study. No developing consolidation or edema. No blunting of  costophrenic angles. No pneumothorax. Degenerative changes in the spine. IMPRESSION: Elevation of right hemidiaphragm with atelectasis in the right lung base similar to prior study. Electronically Signed   By: Lucienne Capers M.D.   On: 12/21/2014 20:29   US Pelvis Limited  12/21/2014  CLINICAL DATA:  Chronic right inguinal swelling since surgery in August 2016, acutely worsened. Fever and chills for 2 days. Initial encounter. EXAM: LIMITED ULTRASOUND OF PELVIS TECHNIQUE: Limited transabdominal ultrasound examination of the pelvis was performed. COMPARISON:  CT of the abdomen and pelvis from 10/22/2014 FINDINGS: There is a mildly complex collection of fluid and debris at the right inguinal region, measuring 4.7 x 4.4 x 3.5 cm. No associated blood flow is seen on limited Doppler evaluation, and this is not directly adjacent to a blood vessel. This may reflect a postoperative seroma. There is no evidence for pseudoaneurysm. IMPRESSION: Mildly complex collection of fluid and debris noted at the right inguinal region, measuring 4.7 cm, likely reflecting a postoperative seroma. No associated blood flow seen. Electronically Signed   By: Garald Balding M.D.   On: 12/21/2014 23:27    Microbiology: Recent Results (from the past 240 hour(s))  Blood culture (routine x 2)     Status: None   Collection Time: 12/21/14  9:00 PM  Result Value Ref Range Status   Specimen Description BLOOD RIGHT ARM  Final   Special Requests BOTTLES DRAWN  AEROBIC AND ANAEROBIC 5CC  Final   Culture NO GROWTH 5 DAYS  Final   Report Status 12/26/2014 FINAL  Final  Blood culture (routine x 2)     Status: None   Collection Time: 12/21/14  9:06 PM  Result Value Ref Range Status   Specimen Description BLOOD RIGHT HAND  Final   Special Requests BOTTLES DRAWN AEROBIC AND ANAEROBIC 5CC  Final   Culture NO GROWTH 5 DAYS  Final   Report Status 12/26/2014 FINAL  Final  Body fluid culture     Status: None   Collection Time: 12/22/14  4:15  PM  Result Value Ref Range Status   Specimen Description FLUID RIGHT GROIN  Final   Special Requests Normal  Final   Gram Stain   Final    MODERATE WBC PRESENT,BOTH PMN AND MONONUCLEAR NO ORGANISMS SEEN    Culture NO GROWTH 3 DAYS  Final   Report Status 12/26/2014 FINAL  Final  Culture, routine-abscess     Status: None   Collection Time: 12/23/14 11:26 AM  Result Value Ref Range Status   Specimen Description ABSCESS GROIN RIGHT  Final   Special Requests SPEC A PT ON ZOSYN AND VANCOMYCIN  Final   Gram Stain   Final    FEW WBC PRESENT, PREDOMINANTLY PMN NO SQUAMOUS EPITHELIAL CELLS SEEN NO ORGANISMS SEEN Performed at [facility]    Culture   Final    NO GROWTH 3 DAYS Performed at [facility]    Report Status 12/26/2014 FINAL  Final  Anaerobic culture     Status: None (Preliminary result)   Collection Time: 12/23/14 11:29 AM  Result Value Ref Range Status   Specimen Description WOUND GROIN RIGHT  Final   Special Requests SPEC C PT ON ZOSYN,VANCOMYCIN  Final   Gram Stain   Final    RARE WBC PRESENT, PREDOMINANTLY PMN NO SQUAMOUS EPITHELIAL CELLS SEEN NO ORGANISMS SEEN Performed at [facility]    Culture   Final    NO ANAEROBES ISOLATED; CULTURE IN PROGRESS FOR 5 DAYS Performed at [facility]    Report Status PENDING  Incomplete  Wound culture     Status: None   Collection Time: 12/23/14 11:29 AM  Result Value Ref Range Status   Specimen Description WOUND GROIN RIGHT  Final   Special Requests SPEC B PT ON ZOSYN AND VANCOMYCIN  Final   Gram Stain   Final    RARE WBC PRESENT, PREDOMINANTLY PMN NO SQUAMOUS EPITHELIAL CELLS SEEN NO ORGANISMS SEEN Performed at [facility]    Culture   Final    NO GROWTH 2 DAYS Performed at [facility]    Report Status 12/25/2014 FINAL  Final     Labs: Basic Metabolic Panel:  Recent Labs Lab 12/21/14 2010 12/22/14 0343 12/24/14 0518 12/25/14 0644 12/26/14 0643  NA 136 135  142 147* 144  K 4.3 4.5 4.6 4.5 4.4  CL 100* 101 105 111 109  CO2 27 26 31 31 30   GLUCOSE 102* 113* 155* 85 93  BUN 18 18 24* 20 16  CREATININE 1.63* 1.67* 2.35* 2.51* 2.02*  CALCIUM 8.8* 8.4* 8.9 8.6* 8.4*   Liver Function Tests: No results for input(s): AST, ALT, ALKPHOS, BILITOT, PROT, ALBUMIN in the last 168 hours. No results for input(s): LIPASE, AMYLASE in the last 168 hours. No results for input(s): AMMONIA in the last 168 hours. CBC:  Recent Labs Lab 12/21/14 2010 12/22/14 0343 12/24/14 0518  12/25/14 0644 12/26/14 0643  WBC 16.4* 18.6* 12.8* 9.8 8.1  NEUTROABS 12.6*  --   --   --   --   HGB 14.5 13.7 13.9 12.7* 11.9*  HCT 43.7 41.6 42.2 40.8 37.3*  MCV 96.0 96.1 95.7 97.8 97.6  PLT 199 208 264 290 285   Cardiac Enzymes: No results for input(s): CKTOTAL, CKMB, CKMBINDEX, TROPONINI in the last 168 hours. BNP: BNP (last 3 results) No results for input(s): BNP in the last 8760 hours.  ProBNP (last 3 results) No results for input(s): PROBNP in the last 8760 hours.  CBG:  Recent Labs Lab 12/23/14 0755 12/23/14 1054 12/25/14 0732 12/26/14 0742  GLUCAP 79 86 93 96       Signed:  Andrewjames,Akasia Ahmad  [facility] 12/26/2014, 3:51 PM

## 2014-12-26 NOTE — Discharge Instructions (Signed)
Home health RN to assist with VAC changes Monday-Wednesday-Friday

## 2014-12-26 NOTE — Care Management Important Message (Signed)
Important Message  Patient Details  Name: NUSSEN WINGERTER MRN: [id number] Date of Birth: 1945/02/23   Medicare Important Message Given:  Yes    Duel Conrad P Leolia Vinzant 12/26/2014, 1:36 PM

## 2014-12-27 DIAGNOSIS — T814XXA Infection following a procedure, initial encounter: Secondary | ICD-10-CM | POA: Diagnosis not present

## 2014-12-28 LAB — ANAEROBIC CULTURE

## 2015-01-02 ENCOUNTER — Ambulatory Visit (INDEPENDENT_AMBULATORY_CARE_PROVIDER_SITE_OTHER): Payer: Medicare Other | Admitting: Cardiology

## 2015-01-02 ENCOUNTER — Encounter: Payer: Self-pay | Admitting: Cardiology

## 2015-01-02 VITALS — BP 114/72 | HR 78 | Ht 67.0 in | Wt 182.0 lb

## 2015-01-02 DIAGNOSIS — I340 Nonrheumatic mitral (valve) insufficiency: Secondary | ICD-10-CM | POA: Diagnosis not present

## 2015-01-02 NOTE — Patient Instructions (Signed)

## 2015-01-02 NOTE — Progress Notes (Signed)
Patient ID: Jonathan Church, male   DOB: [birth year], 69 y.o.   MRN: [id number]     Clinical Summary Jonathan Church is a 69 y.o.male seen today for follow up of the following medical problems.   1. Mitral regurgitation - history of severe MR, he is s/p MV repair by Dr Roxy Manns 08/2014.  - denies any SOB/DOE, no LE edema  2. Lymphocele right groin - related to recent valve surgery access site in right groin, treated with antibiotics and wound vac which is still in place.  - TEE negative for vegetation Past Medical History  Diagnosis Date  . Sleep apnea     wears CPAP  . Heart murmur   . Head injury, closed   . Shortness of breath dyspnea     with exertion  . Asthma     years ago  . Hemorrhoids   . Severe mitral regurgitation 05/16/2014  . S/P minimally invasive mitral valve repair 09/18/2014    Complex valvuloplasty including triangular resection of flail segment of posterior leaflet, artificial Gore-tex neochord placement x4 and 32 mm Sorin Memo 3D Rechord ring annuloplasty via right mini thoracotomy approach  . Lymphocele after surgical procedure 11/18/2014    Right groin lymphocele     Allergies  Allergen Reactions  . Peanut-Containing Drug Products Hives and Itching  . Tape Rash    Rash from tape wrapped around arm after giving blood     Current Outpatient Prescriptions  Medication Sig Dispense Refill  . acetaminophen (TYLENOL) 500 MG tablet Take 1,000 mg by mouth every 6 (six) hours as needed for fever.    Marland Kitchen amoxicillin-clavulanate (AUGMENTIN) 500-125 MG tablet Take 1 tablet (500 mg total) by mouth every 12 (twelve) hours. For 5 days 10 tablet 0  . aspirin EC 81 MG EC tablet Take 1 tablet (81 mg total) by mouth daily. (Patient taking differently: Take 81 mg by mouth at bedtime. )    . doxycycline (VIBRA-TABS) 100 MG tablet Take 1 tablet (100 mg total) by mouth every 12 (twelve) hours. For 5 days 10 tablet 0  . ondansetron (ZOFRAN ODT) 8 MG disintegrating tablet Take 1 tablet  (8 mg total) by mouth every 8 (eight) hours as needed for nausea or vomiting. (Patient not taking: Reported on 12/21/2014) 12 tablet 0  . OVER THE COUNTER MEDICATION Take 80,000-240,000 Units by mouth 3 (three) times a week. Serra peptase (1 capsule 80,000 units)  - protein destroying enzymes for inflammation and cholesterol    . oxyCODONE (OXY IR/ROXICODONE) 5 MG immediate release tablet Take 1 tablet (5 mg total) by mouth every 6 (six) hours as needed for moderate pain. 30 tablet 0  . senna-docusate (SENOKOT-S) 8.6-50 MG tablet Take 1 tablet by mouth at bedtime as needed for mild constipation. 10 tablet 0   No current facility-administered medications for this visit.     Past Surgical History  Procedure Laterality Date  . Tee without cardioversion N/A 06/11/2014    Procedure: TRANSESOPHAGEAL ECHOCARDIOGRAM (TEE);  Surgeon: Arnoldo Lenis, MD;  Location: [facility];  Service: Cardiology;  Laterality: N/A;  . Cardiac catheterization N/A 06/18/2014    Procedure: Right/Left Heart Cath and Coronary Angiography;  Surgeon: Jettie Booze, MD;  Location: [facility];  Service: Cardiovascular;  Laterality: N/A;  . Nasal septrum  1970's  . Colonoscopy    . Mitral valve repair Right 09/18/2014    Procedure: MINIMALLY INVASIVE MITRAL VALVE REPAIR (MVR);  Surgeon: [name], MD;  Location:  [facility] OR;  Service: Open Heart Surgery;  Laterality: Right;  . Tee without cardioversion N/A 09/18/2014    Procedure: TRANSESOPHAGEAL ECHOCARDIOGRAM (TEE);  Surgeon: [name], MD;  Location: [facility];  Service: Open Heart Surgery;  Laterality: N/A;  . Groin debridement Right 12/23/2014    Procedure: GROIN DEBRIDEMENT with wound VAC application;  Surgeon: [name], MD;  Location: [facility];  Service: Vascular;  Laterality: Right;  . Tee without cardioversion N/A 12/25/2014    Procedure: TRANSESOPHAGEAL ECHOCARDIOGRAM (TEE);  Surgeon: Josue Hector, MD;  Location: [facility] ENDOSCOPY;  Service:  Cardiovascular;  Laterality: N/A;     Allergies  Allergen Reactions  . Peanut-Containing Drug Products Hives and Itching  . Tape Rash    Rash from tape wrapped around arm after giving blood      Family History  Problem Relation Age of Onset  . Heart failure Father   . Heart failure Paternal Grandmother      Social History Jonathan Church reports that he has never smoked. He has never used smokeless tobacco. Jonathan Church reports that he does not drink alcohol.   Review of Systems CONSTITUTIONAL: No weight loss, fever, chills, weakness or fatigue.  HEENT: Eyes: No visual loss, blurred vision, double vision or yellow sclerae.No hearing loss, sneezing, congestion, runny nose or sore throat.  SKIN: No rash or itching.  CARDIOVASCULAR: per HPI RESPIRATORY: No shortness of breath, cough or sputum.  GASTROINTESTINAL: No anorexia, nausea, vomiting or diarrhea. No abdominal pain or blood.  GENITOURINARY: No burning on urination, no polyuria NEUROLOGICAL: No headache, dizziness, syncope, paralysis, ataxia, numbness or tingling in the extremities. No change in bowel or bladder control.  MUSCULOSKELETAL: No muscle, back pain, joint pain or stiffness.  LYMPHATICS: No enlarged nodes. No history of splenectomy.  PSYCHIATRIC: No history of depression or anxiety.  ENDOCRINOLOGIC: No reports of sweating, cold or heat intolerance. No polyuria or polydipsia.  Marland Kitchen   Physical Examination Filed Vitals:   01/02/15 1359  BP: 114/72  Pulse: 78   Filed Vitals:   01/02/15 1359  Height: 5\' 7"  (1.702 m)  Weight: 182 lb (82.555 kg)    Gen: resting comfortably, no acute distress HEENT: no scleral icterus, pupils equal round and reactive, no palptable cervical adenopathy,  CV: RRR, no m/r/g, no jvd Resp: Clear to auscultation bilaterally GI: abdomen is soft, non-tender, non-distended, normal bowel sounds, no hepatosplenomegaly MSK: extremities are warm, no edema.  Skin: warm, no rash Neuro:  no  focal deficits Psych: appropriate affect   Diagnostic Studies  04/2014 echo Study Conclusions  - Left ventricle: The cavity size was normal. Systolic function was normal. The estimated ejection fraction was in the range of 60% to 65%. Wall motion was normal; there were no regional wall motion abnormalities. 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 Suggest TEE and consideration for repair if clinically indicated. - Left atrium: The atrium was mildly dilated. - Atrial septum: No defect or patent foramen ovale was identified.  05/2014 TEE Study Conclusions  - Left ventricle: The cavity size was normal. Wall thickness was normal. Systolic function was normal. The estimated ejection fraction was in the range of 60% to 65%. - Aortic valve: No evidence of vegetation. - Mitral valve: The prolapsing portion of the posterior leaflet creates two seperate jets.This is best seen in image 23 with TEE probe at 110 degree angle. There is a very eccentric anterior jet that runs along the atrial wall and is difficult  to quantify. There is a more centrally directed jet with a vena contracta of 0.4 cm. There was severe regurgitation. - Left atrium: The atrium was dilated. No evidence of thrombus in the appendage. - Right ventricle: The cavity size was mildly dilated. - Right atrium: No evidence of thrombus in the atrial cavity or appendage. - Atrial septum: No defect or patent foramen ovale was identified. Echo contrast study showed no right-to-left atrial level shunt, following an increase in RA pressure induced by provocative maneuvers. - Tricuspid valve: There was moderate regurgitation. The TR vena contracta is 0.4 cm.  Impressions:  - Severe eccentric MR. Eccentricity limits quantification, however regurgitation appears to be severe.   05/2014 Cath  No significant coronary artery disease.  Mild  pulmonary artery hypertension.  Prominent V waves noted on wedge tracing. Mildly elevated LVEDP.  Pulmonary artery saturation 65%. Aortic saturation 96%. Cardiac output 4.2 L/m. Cardiac index 2.1.  Further plans per Dr. Harl Bowie regarding mitral valve repair. The patient will be aggressively hydrated due to some mild renal insufficiency. Cardiology f/u with Dr. Harl Bowie.   11/2014 Echo Study Conclusions  - Procedure narrative: Transthoracic echocardiography. Image quality was adequate. The study was technically difficult. - Left ventricle: Systolic function was normal. The estimated ejection fraction was in the range of 50% to 55%. - Ventricular septum: Septal motion showed abnormal function, dyssynergy, and paradox. These changes are consistent with intraventricular conduction delay. - Mitral valve: Prior procedures included surgical repair. An annular ring prosthesis was present. - Tricuspid valve: Cannot fully exclude a vegetation. Suggest TEE. Mildly thickened leaflets. Mild, late systolicprolapse. There was mild-moderate regurgitation directed centrally. - Pulmonary arteries: Systolic pressure was mildly increased. PA peak pressure: 38 mm Hg (S).  11/2014 TEE Study Conclusions  - Left ventricle: The cavity size was normal. Wall thickness was normal. Systolic function was normal. The estimated ejection fraction was in the range of 55% to 60%. - Aortic valve: No evidence of vegetation. - Mitral valve: Post repair with no significant regurgitation and no vegetation - Right atrium: No evidence of thrombus in the atrial cavity or appendage. - Atrial septum: No defect or patent foramen ovale was identified. - Tricuspid valve: Possible mild prolapse Moderate TR no vegation or evidence of SBE. - Pulmonic valve: No evidence of vegetation.   Assessment and Plan  1. Mitral regurgitation - s/p mitral valve repair - overall doing well since surgery. -  will continue to follow clinically    F/ 1 year       Arnoldo Lenis, M.D.

## 2015-01-13 ENCOUNTER — Encounter: Payer: Self-pay | Admitting: Thoracic Surgery (Cardiothoracic Vascular Surgery)

## 2015-01-13 ENCOUNTER — Ambulatory Visit (INDEPENDENT_AMBULATORY_CARE_PROVIDER_SITE_OTHER): Payer: Medicare Other | Admitting: Thoracic Surgery (Cardiothoracic Vascular Surgery)

## 2015-01-13 VITALS — BP 135/88 | HR 73 | Resp 20 | Ht 67.0 in | Wt 182.0 lb

## 2015-01-13 DIAGNOSIS — T8189XA Other complications of procedures, not elsewhere classified, initial encounter: Secondary | ICD-10-CM

## 2015-01-13 DIAGNOSIS — Z9889 Other specified postprocedural states: Secondary | ICD-10-CM | POA: Diagnosis not present

## 2015-01-13 DIAGNOSIS — I340 Nonrheumatic mitral (valve) insufficiency: Secondary | ICD-10-CM | POA: Diagnosis not present

## 2015-01-13 DIAGNOSIS — I898 Other specified noninfective disorders of lymphatic vessels and lymph nodes: Secondary | ICD-10-CM | POA: Diagnosis not present

## 2015-01-13 NOTE — Progress Notes (Signed)
      [address]       [place] [address]             [phone]     CARDIOTHORACIC SURGERY OFFICE NOTE  Referring Provider is Arnoldo Lenis, MD PCP is London Pepper, MD   HPI:  Patient returns to the office today for wound check status post incision and drainage of infected right groin lymphocele On 12/23/2014. He originally underwent minimally invasive mitral valve repair on 09/18/2014.  He states that he has been doing well over the last 3 weeks. His fevers completely resolved immediately after surgery. He completed his course of oral antibiotics. Home health nursing care has been assisting with wound VAC changes 3 days a week. They have reported excellent progress and no complications so far. The patient otherwise feels well. He denies any chest discomfort or shortness of breath. He has not had fevers or chills. Appetite is good.     Current Outpatient Prescriptions  Medication Sig Dispense Refill  . aspirin EC 81 MG EC tablet Take 1 tablet (81 mg total) by mouth daily. (Patient taking differently: Take 81 mg by mouth at bedtime. )    . OVER THE COUNTER MEDICATION Take 80,000-240,000 Units by mouth 3 (three) times a week. Serra peptase (1 capsule 80,000 units)  - protein destroying enzymes for inflammation and cholesterol     No current facility-administered medications for this visit.      Physical Exam:   BP 135/88 mmHg  Pulse 73  Resp 20  Ht 5\' 7"  (1.702 m)  Wt 182 lb (82.555 kg)  BMI 28.50 kg/m2  SpO2 96%  General:  Well-appearing  Chest:   Clear to auscultation  CV:   Regular rate and rhythm without murmur  Incisions:  Right groin wound is healing very nicely. The wound has contracted considerably, now measuring 3.0 x 1.5 x 1.0 cm in dimensions. There is healthy granulation tissue throughout. There is no surrounding cellulitis.  Abdomen:  Soft  Extremities:  Warm  Diagnostic Tests:  n/a   Impression:  Excellent progress with clean  right groin wound that is healing nicely and contracting rapidly using negative pressure wound therapy. I would anticipate that we will be able to discontinue negative pressure wound therapy within 1-2 weeks depending upon further progress.    Plan:  We will continue negative pressure wound therapy for the time being. We will anticipate converting to saline moistened gauze wet-to-dry dressings when the wound becomes too small to continue to apply a wound VAC device.  The patient will return for follow-up and wound check in 4 weeks.   Valentina Gu. Roxy Manns, MD 01/13/2015 11:18 AM

## 2015-02-17 ENCOUNTER — Ambulatory Visit (INDEPENDENT_AMBULATORY_CARE_PROVIDER_SITE_OTHER): Payer: Medicare Other | Admitting: Thoracic Surgery (Cardiothoracic Vascular Surgery)

## 2015-02-17 ENCOUNTER — Ambulatory Visit: Payer: Medicare Other | Admitting: Thoracic Surgery (Cardiothoracic Vascular Surgery)

## 2015-02-17 ENCOUNTER — Encounter: Payer: Self-pay | Admitting: Thoracic Surgery (Cardiothoracic Vascular Surgery)

## 2015-02-17 VITALS — BP 165/90 | HR 97 | Resp 16 | Ht 67.5 in | Wt 181.0 lb

## 2015-02-17 DIAGNOSIS — I898 Other specified noninfective disorders of lymphatic vessels and lymph nodes: Secondary | ICD-10-CM | POA: Diagnosis not present

## 2015-02-17 DIAGNOSIS — Z9889 Other specified postprocedural states: Secondary | ICD-10-CM

## 2015-02-17 NOTE — Progress Notes (Signed)
      [address]       [place] [address]             [phone]     CARDIOTHORACIC SURGERY OFFICE NOTE  Referring Provider is Arnoldo Lenis, MD PCP is London Pepper, MD   HPI:  Patient returns to the office today for wound check status post incision and drainage of infected right groin lymphocele on 12/23/2014. He originally underwent minimally invasive mitral valve repair on 09/18/2014. He was last seen here in our office on 01/13/2015. Since then his wound has finished granulating in and healed completely. He returns for office today reporting that he feels exceptionally well. He has no problems or complaints whatsoever.   Current Outpatient Prescriptions  Medication Sig Dispense Refill  . aspirin EC 81 MG EC tablet Take 1 tablet (81 mg total) by mouth daily. (Patient taking differently: Take 81 mg by mouth at bedtime. )    . OVER THE COUNTER MEDICATION Take 80,000-240,000 Units by mouth 3 (three) times a week. Serra peptase (1 capsule 80,000 units)  - protein destroying enzymes for inflammation and cholesterol     No current facility-administered medications for this visit.      Physical Exam:   BP 165/90 mmHg  Pulse 97  Resp 16  Ht 5' 7.5" (1.715 m)  Wt 181 lb (82.101 kg)  BMI 27.91 kg/m2  SpO2 98%  General:  Well-appearing  Chest:   Clear to auscultation  CV:   Regular rate and rhythm without murmur  Incisions:  Right groin incision is healed completely  Abdomen:  Soft and nontender  Extremities:  Warm and well-perfused  Diagnostic Tests:  n/a   Impression:  Patient's right groin lymphocele and wound have resolved completely.  Plan:  The patient will return for routine follow-up next August, approximately 1 year following his original surgery.  I spent in excess of 10 minutes during the conduct of this office consultation and >50% of this time involved direct face-to-face encounter with the patient for counseling and/or  coordination of their care.    Valentina Gu. Roxy Manns, MD 02/17/2015 1:27 PM

## 2015-02-17 NOTE — Patient Instructions (Signed)
You may resume unrestricted physical activity without any particular limitations at this time.  Continue all previous medications without any changes at this time  

## 2015-06-16 IMAGING — CT CT CTA ABD/PEL W/CM AND/OR W/O CM
1 of 4 series · 13 of 42 positions shown, 17 images · IV contrast (APPLIED)
Comparison: None.

CLINICAL DATA: [age] year old male with fatigue, mitral valve
prolapse and suspected aortic aneurysm and/ or aortoiliac occlusive
disease.

EXAM:
CT ANGIOGRAPHY CHEST, ABDOMEN AND PELVIS
TECHNIQUE: Multidetector CT imaging through the chest, abdomen and pelvis was
performed using the standard protocol during bolus administration of
intravenous contrast. Multiplanar reconstructed images and MIPs were
obtained and reviewed to evaluate the vascular anatomy.
CONTRAST:  80mL OMNIPAQUE IOHEXOL 350 MG/ML SOLN

[Series 5: cap (person_name) 3.0 b31s · axial · 0.71mm/px · z∈[-678,-60]mm · 13 of 228 slices shown, 17 images]
[im 11/228  soft-tissue]
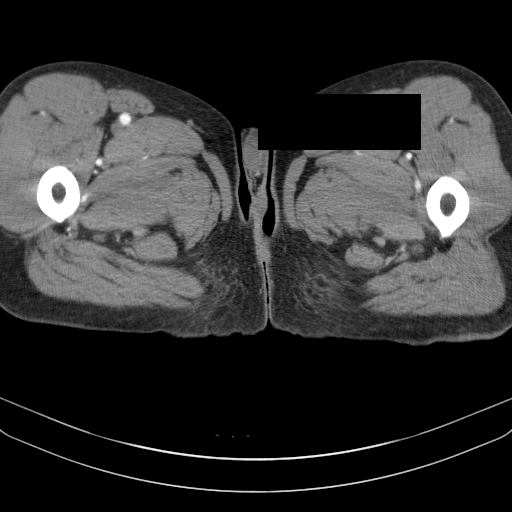
[im 11/228  bone]
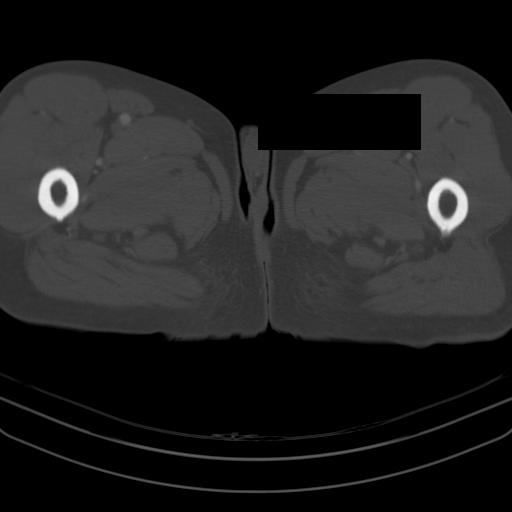
[im 31/228  soft-tissue]
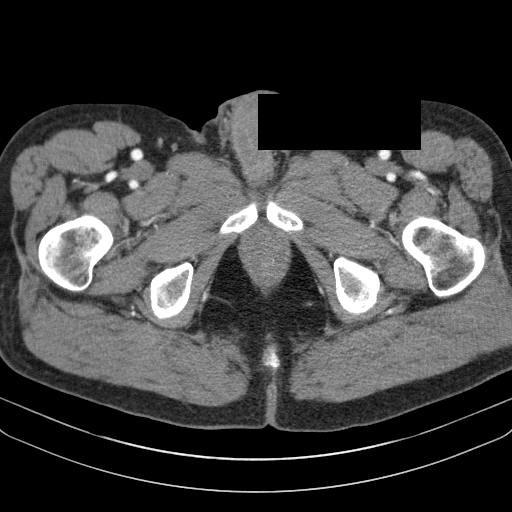
[im 52/228  soft-tissue]
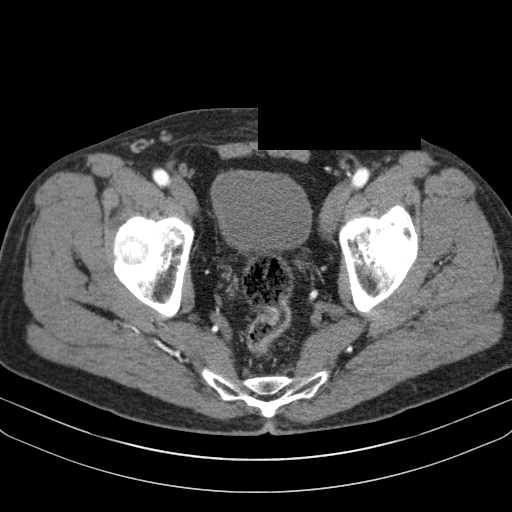
[im 73/228  soft-tissue]
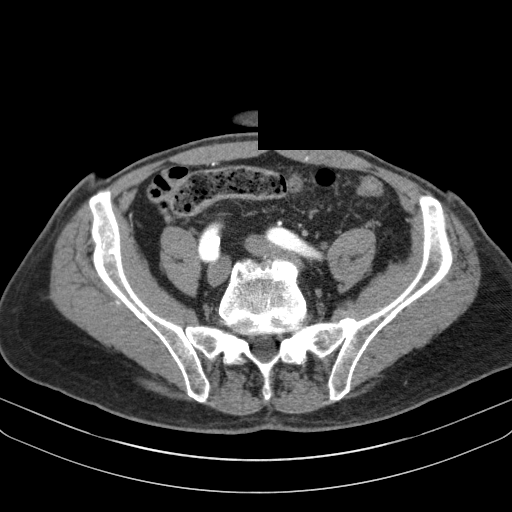
[im 93/228  soft-tissue]
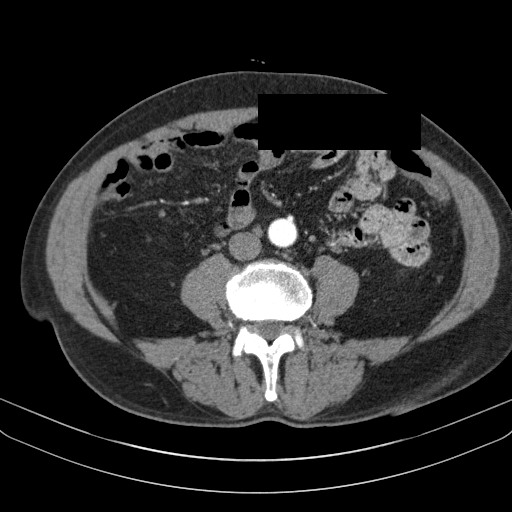
[im 114/228  soft-tissue]
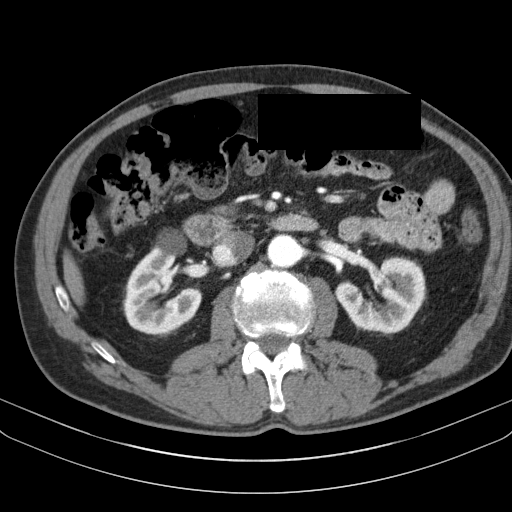
[im 135/228  soft-tissue]
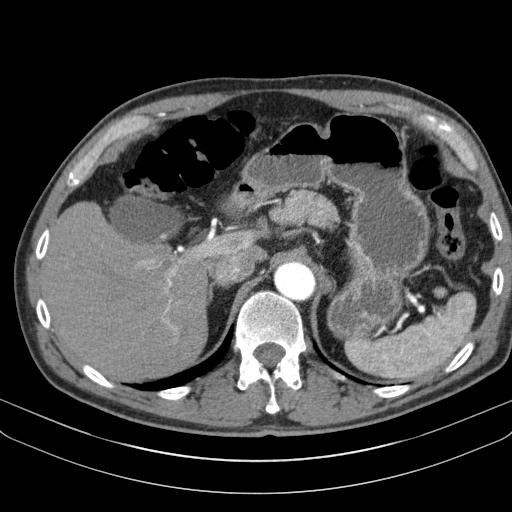
[im 155/228  soft-tissue]
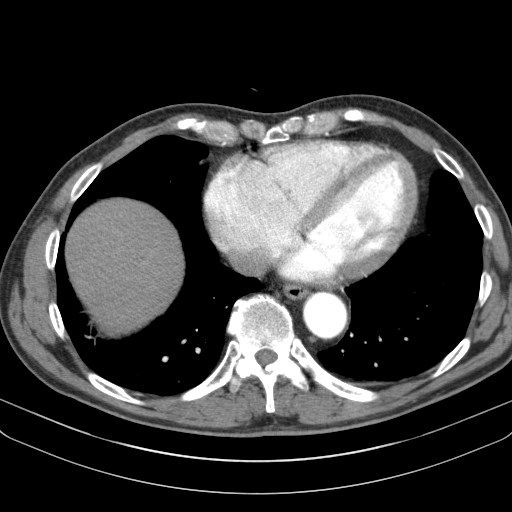
[im 176/228  soft-tissue]
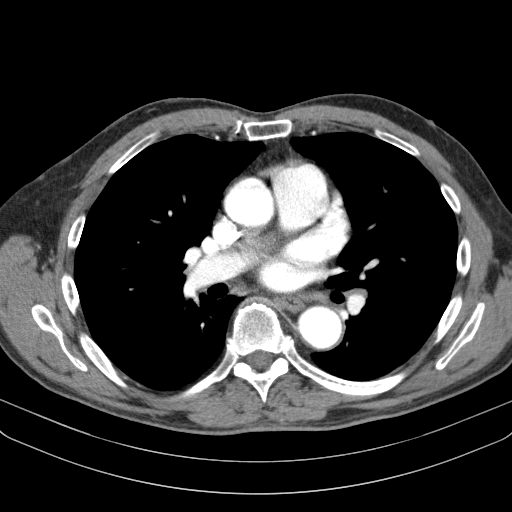
[im 176/228  bone]
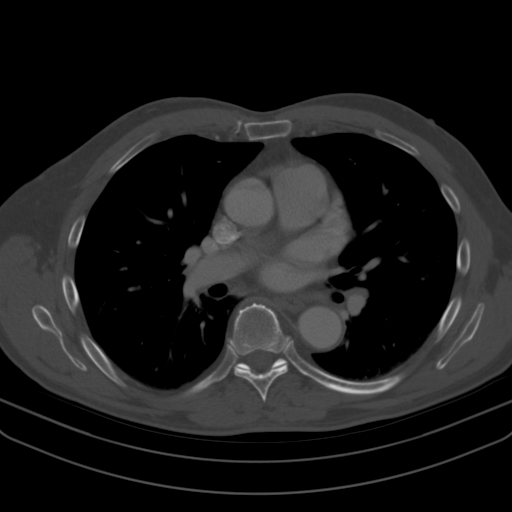
[im 186/228  lung]
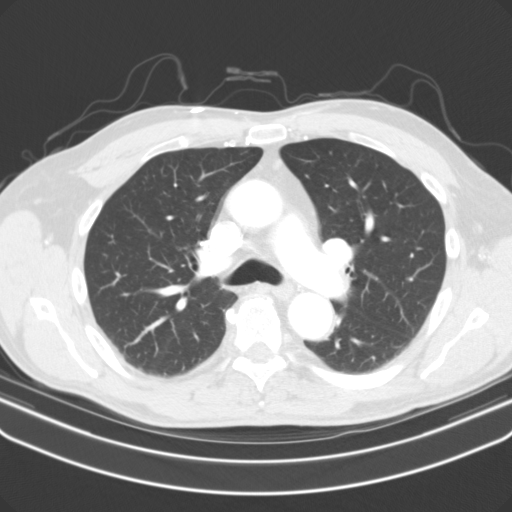
[im 197/228  soft-tissue]
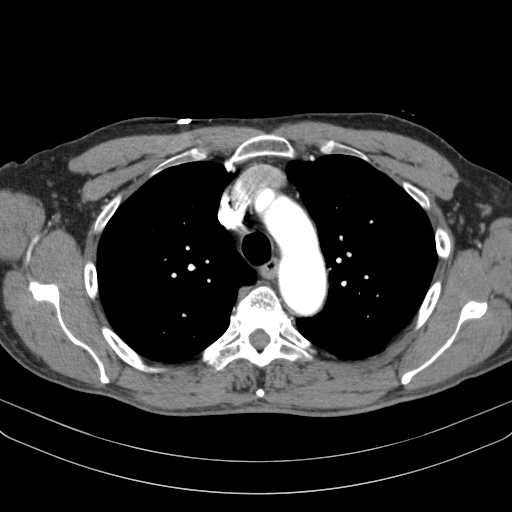
[im 197/228  lung]
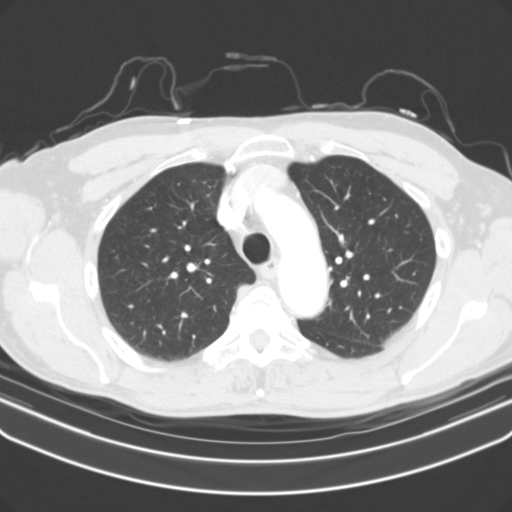
[im 207/228  lung]
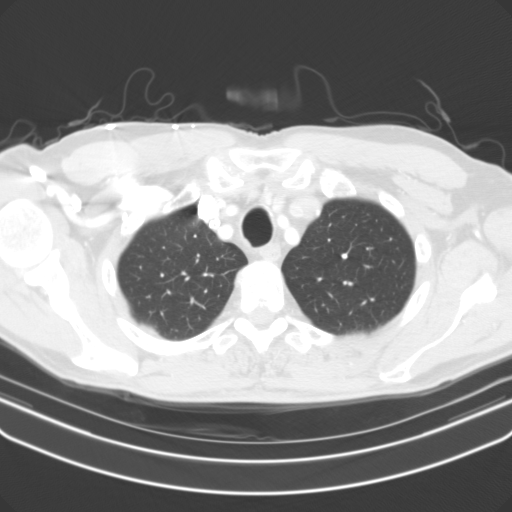
[im 217/228  soft-tissue]
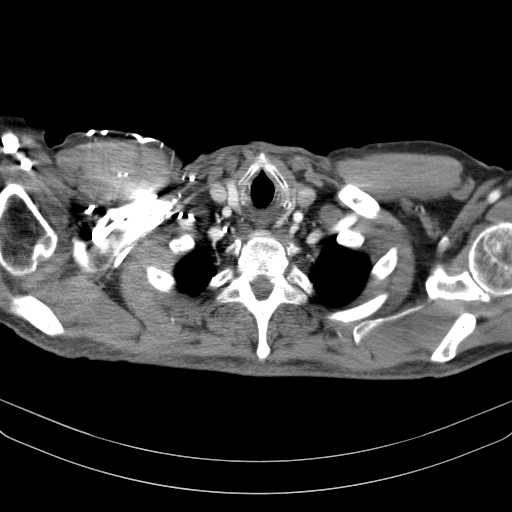
[im 217/228  lung]
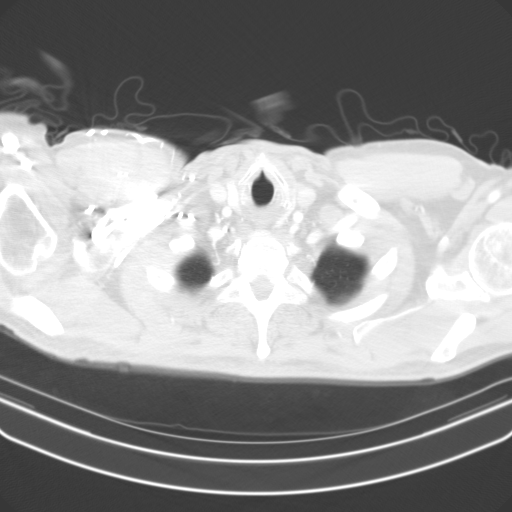

[13 of 42 positions shown; findings below may reference images not displayed]

FINDINGS: CTA CHEST FINDINGS

VASCULAR

Heart/Vascular: Conventional 3 vessel aortic arch anatomy. Mildly
tortuous thoracic aorta. No evidence of aneurysm, dissection or
other acute aortic abnormality. The main and central pulmonary
arteries are within normal limits for size. No central. Mild
cardiomegaly. No pericardial effusion

Review of the MIP images confirms the above findings.

NON VASCULAR

Mediastinum: Unremarkable CT appearance of the thyroid gland. No
suspicious mediastinal or hilar adenopathy. No soft tissue
mediastinal mass. The thoracic esophagus is unremarkable.

Lungs/Pleura: Mild dependent atelectasis in the lower lobes.
Otherwise, the lungs are clear.

Bones/Soft Tissues: No acute fracture or aggressive appearing lytic
or blastic osseous lesion.

CTA ABDOMEN AND PELVIS FINDINGS

VASCULAR

Aorta: Normal caliber aorta without evidence of atherosclerotic
plaque, dissection or other acute abnormality.

Celiac: Variants arterial anatomy. The splenic artery arises
directly from the abdominal aorta. The common hepatic artery is
replaced the SMA.

SMA: Replaced common hepatic artery.

Renals: Single dominant renal arteries bilaterally which are widely
patent. No changes of fibromuscular dysplasia.

IMA: Widely patent.

Inflow: Widely patent and unremarkable.

Proximal Outflow: Widely patent and unremarkable.

Veins: No focal venous abnormality within the limitations of this
non venous phase timed study.

Review of the MIP images confirms the above findings.

NON-VASCULAR

Abdomen: Unremarkable CT scan of stomach, duodenum, spleen, adrenal
glands and pancreas. No discrete hepatic lesion. Calcified
gallstones within the gallbladder lumen. No secondary findings to
suggest acute cholecystitis no intra or extrahepatic biliary ductal
dilatation.

Unremarkable appearance of the bilateral kidneys. No focal solid
lesion, hydronephrosis or nephrolithiasis. 2.9 cm simple cyst
exophytic from the anterior interpolar right kidney. Gallbladder is
unremarkable. No evidence of obstruction or focal bowel wall
thickening. Normal appendix in the right lower quadrant. The
terminal ileum is unremarkable. No free fluid or suspicious
adenopathy.

Pelvis: Prostatomegaly with some indentation of the bladder.
Otherwise, the bladder is unremarkable no free fluid or significant
adenopathy.

Bones/Soft Tissues: No acute fracture or aggressive appearing lytic
or blastic osseous lesion. T9 and T4 vertebral body hemangiomas.
Multilevel degenerative disc disease most notable at L4-L5 and
L5-S1.
IMPRESSION: VASCULAR

1. No evidence of acute vascular abnormality, aneurysm or
significant atherosclerotic vascular plaque or stenosis.
2. Variant anatomy with replaced common hepatic artery to the SMA.
NON VASCULAR

1. No acute abnormality in the chest, abdomen or pelvis.
2. Mild cardiomegaly.
3. Cholelithiasis.
4. Simple right renal cyst.
5. Prostatomegaly with indentation of the bladder base.
6. Lower lumbar degenerative disc disease.

## 2015-09-15 ENCOUNTER — Ambulatory Visit (INDEPENDENT_AMBULATORY_CARE_PROVIDER_SITE_OTHER): Payer: Medicare Other | Admitting: Thoracic Surgery (Cardiothoracic Vascular Surgery)

## 2015-09-15 ENCOUNTER — Encounter: Payer: Self-pay | Admitting: Thoracic Surgery (Cardiothoracic Vascular Surgery)

## 2015-09-15 VITALS — BP 139/90 | HR 69 | Resp 20 | Ht 67.0 in | Wt 180.0 lb

## 2015-09-15 DIAGNOSIS — Z9889 Other specified postprocedural states: Secondary | ICD-10-CM | POA: Diagnosis not present

## 2015-09-15 NOTE — Patient Instructions (Signed)

## 2015-09-15 NOTE — Progress Notes (Signed)
[address]       [place] [address]             [phone]     CARDIOTHORACIC SURGERY OFFICE NOTE  Referring Provider is Branch, Alphonse Guild, MD PCP is London Pepper, MD   HPI:  Patient is a 70 year old male who returns to the office today for routine follow-up approximately one year status post minimally invasive mitral valve repair on 09/18/2014. He was last seen here in our office on 02/17/2015. Since then he has done very well. He lives a very active lifestyle and he reports no significant limitations whatsoever. He denies any symptoms of exertional shortness of breath or chest discomfort. He reports feeling noticeably better in comparison with how he felt prior to surgery, despite the fact that he had relatively mild symptoms at that time. Overall he has no complaints.  His last echo was performed 12/23/2014. At that time LV function appeared normal and the mitral valve repair appeared intact.   Current Outpatient Prescriptions  Medication Sig Dispense Refill  . aspirin EC 81 MG EC tablet Take 1 tablet (81 mg total) by mouth daily. (Patient taking differently: Take 81 mg by mouth at bedtime. )    . OVER THE COUNTER MEDICATION Take 80,000-240,000 Units by mouth 3 (three) times a week. Serra peptase (1 capsule 80,000 units)  - protein destroying enzymes for inflammation and cholesterol     No current facility-administered medications for this visit.       Physical Exam:   BP 139/90 (BP Location: Left Arm, Patient Position: Sitting, Cuff Size: Small)   Pulse 69   Resp 20   Ht 5\' 7"  (1.702 m)   Wt 180 lb (81.6 kg)   SpO2 98% Comment: RA  BMI 28.19 kg/m   General:  Well-appearing  Chest:   Clear to auscultation  CV:   Regular rate and rhythm without murmur  Incisions:  Completely healed  Abdomen:  Soft nontender  Extremities:  Warm and well-perfused  Diagnostic Tests:  Transthoracic Echocardiography  Patient:    Masood, Stclair MR #:        [id number] Study Date: 12/23/2014 Gender:     M Age:        49 Height:     172.7 cm Weight:     74.4 kg BSA:        1.9 m^2 Pt. Status: Room:   SONOGRAPHER  [facility]  ATTENDING    [place], [place]  PERFORMING   [facility], Inpatient  cc:  ------------------------------------------------------------------- LV EF: 50% -   55%  ------------------------------------------------------------------- Indications:      Fever 780.6.  ------------------------------------------------------------------- History:   PMH:  Sleep apnea. Murmur. Asthma. Severe mitral regurgitation. Mitral valve repair.  Dyspnea.  ------------------------------------------------------------------- Study Conclusions  - Procedure narrative: Transthoracic echocardiography. Image   quality was adequate. The study was technically difficult. - Left ventricle: Systolic function was normal. The estimated   ejection fraction was in the range of 50% to 55%. - Ventricular septum: Septal motion showed abnormal function,   dyssynergy, and paradox. These changes are consistent with   intraventricular conduction delay. - Mitral valve: Prior procedures included surgical repair. An   annular ring prosthesis was present. - Tricuspid valve: Cannot fully exclude a vegetation. Suggest TEE.   Mildly thickened leaflets. Mild, late systolicprolapse. There was   mild-moderate regurgitation directed centrally. - Pulmonary arteries: Systolic pressure was  mildly increased. PA   peak pressure: 38 mm Hg (S).  Transthoracic echocardiography.  M-mode, complete 2D, spectral Doppler, and color Doppler.  Birthdate:  Patient birthdate: [birth year].  Age:  Patient is 70 yr old.  Sex:  Gender: male. BMI: 24.9 kg/m^2.  Blood pressure:     113/68  Patient status: Inpatient.  Study date:  Study date: 12/23/2014. Study time: 09:13 AM.  Location:  Echo  laboratory.  -------------------------------------------------------------------  ------------------------------------------------------------------- Left ventricle:  Systolic function was normal. The estimated ejection fraction was in the range of 50% to 55%.  ------------------------------------------------------------------- Aortic valve:   Structurally normal valve. Trileaflet. Cusp separation was normal.  Doppler:  Transvalvular velocity was within the normal range. There was no stenosis. There was no regurgitation.  ------------------------------------------------------------------- Aorta:  Aortic root: The aortic root was normal in size. Ascending aorta: The ascending aorta was normal in size.  ------------------------------------------------------------------- Mitral valve:   Mildly thickened leaflets . Prior procedures included surgical repair. An annular ring prosthesis was present. Doppler:  Transvalvular velocity was increased due to annuloplasty repair. Mean gradient 6 mm Hg at heart rate 88 bpm. There was trivial regurgitation.    Peak gradient (D): 6 mm Hg.  ------------------------------------------------------------------- Left atrium:  The atrium was normal in size.  ------------------------------------------------------------------- Right ventricle:  The cavity size was normal. Wall thickness was normal. Systolic function was normal.  ------------------------------------------------------------------- Ventricular septum:   Septal motion showed abnormal function, dyssynergy, and paradox. These changes are consistent with intraventricular conduction delay.  ------------------------------------------------------------------- Pulmonic valve:   Poorly visualized.  The valve appears to be grossly normal.    Doppler:  There was no significant regurgitation.  ------------------------------------------------------------------- Tricuspid valve:  Cannot  fully exclude a vegetation. Suggest TEE. Mildly thickened leaflets. Leaflet separation was normal.  Mild, late systolicprolapse.  Doppler:  Transvalvular velocity was within the normal range. There was mild-moderate regurgitation directed centrally.  ------------------------------------------------------------------- Pulmonary artery:   Systolic pressure was mildly increased.  ------------------------------------------------------------------- Right atrium:  The atrium was normal in size.  ------------------------------------------------------------------- Pericardium:  There was no pericardial effusion.  ------------------------------------------------------------------- Measurements   Left ventricle                         Value        Reference  LV ID, ED, PLAX chordal        (L)     42.5  mm     43 - 52  LV ID, ES, PLAX chordal                30.5  mm     23 - 38  LV fx shortening, PLAX chordal (L)     28    %      >=29  LV PW thickness, ED                    10.5  mm     ---------  IVS/LV PW ratio, ED                    0.98         <=1.3    Ventricular septum                     Value        Reference  IVS thickness, ED                      10.3  mm     ---------    LVOT                                   Value        Reference  LVOT ID, S                             20    mm     ---------  LVOT area                              3.14  cm^2   ---------    Aorta                                  Value        Reference  Aortic root ID, ED                     37    mm     ---------    Left atrium                            Value        Reference  LA ID, A-P, ES                         28    mm     ---------  LA ID/bsa, A-P                         1.47  cm/m^2 <=2.2  LA volume, S                           43.9  ml     ---------  LA volume/bsa, S                       23.1  ml/m^2 ---------  LA volume, ES, 1-p A4C                 37.4  ml     ---------  LA volume/bsa, ES,  1-p A4C             19.7  ml/m^2 ---------  LA volume, ES, 1-p A2C                 44.6  ml     ---------  LA volume/bsa, ES, 1-p A2C             23.5  ml/m^2 ---------    Mitral valve                           Value        Reference  Mitral E-wave peak velocity            127   cm/s   ---------  Mitral A-wave peak velocity            119   cm/s   ---------  Mitral deceleration time  229   ms     150 - 230  Mitral peak gradient, D                6     mm Hg  ---------  Mitral E/A ratio, peak                 1.1          ---------    Pulmonary arteries                     Value        Reference  PA pressure, S, DP             (H)     38    mm Hg  <=30    Tricuspid valve                        Value        Reference  Tricuspid regurg peak velocity         295   cm/s   ---------  Tricuspid peak RV-RA gradient          35    mm Hg  ---------    Systemic veins                         Value        Reference  Estimated CVP                          3     mm Hg  ---------    Right ventricle                        Value        Reference  RV pressure, S, DP             (H)     38    mm Hg  <=30  RV s&', lateral, S                      11.6  cm/s   ---------  Legend: (L)  and  (H)  mark values outside specified reference range.  ------------------------------------------------------------------- Prepared and Electronically Authenticated by  Sanda Klein, MD 2016-11-28T12:53:57   Impression:  Patient is doing very well approximately one year status post minimally invasive mitral valve repair.  Plan:  In the future the patient will call and return to see [place] only should specific problems or questions arise.  The patient has been reminded regarding the importance of dental hygiene and the lifelong need for antibiotic prophylaxis for all dental cleanings and other related invasive procedures.  I spent in excess of 15 minutes during the conduct of this office consultation  and >50% of this time involved direct face-to-face encounter with the patient for counseling and/or coordination of their care.   Valentina Gu. Roxy Manns, MD 09/15/2015 1:02 PM

## 2015-09-22 ENCOUNTER — Encounter: Payer: Medicare Other | Admitting: Thoracic Surgery (Cardiothoracic Vascular Surgery)

## 2016-02-23 ENCOUNTER — Encounter: Payer: Self-pay | Admitting: *Deleted

## 2016-02-24 ENCOUNTER — Ambulatory Visit (INDEPENDENT_AMBULATORY_CARE_PROVIDER_SITE_OTHER): Payer: PPO | Admitting: Cardiology

## 2016-02-24 ENCOUNTER — Encounter: Payer: Self-pay | Admitting: Cardiology

## 2016-02-24 VITALS — BP 144/86 | HR 66 | Ht 67.0 in | Wt 186.8 lb

## 2016-02-24 DIAGNOSIS — N183 Chronic kidney disease, stage 3 unspecified: Secondary | ICD-10-CM

## 2016-02-24 DIAGNOSIS — Z9889 Other specified postprocedural states: Secondary | ICD-10-CM

## 2016-02-24 DIAGNOSIS — R7309 Other abnormal glucose: Secondary | ICD-10-CM

## 2016-02-24 NOTE — Progress Notes (Signed)
Clinical Summary Mr. Fisch is a 71 y.o.male  seen today for follow up of the following medical problems.   1. Mitral regurgitation - history of severe MR, he is s/p MV repair by Dr Roxy Manns 08/2014.  - no recent SOB or DOE, no LE edema   2. Elevated bp - he reports history of white coat HTN - checks at home often, typically  120s/70s  Past Medical History:  Diagnosis Date  . Asthma    years ago  . Head injury, closed   . Heart murmur   . Hemorrhoids   . Lymphocele after surgical procedure 11/18/2014   Right groin lymphocele  . 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 Severe mitral regurgitation 05/16/2014  . Shortness of breath dyspnea    with exertion  . Sleep apnea    wears CPAP     Allergies  Allergen Reactions  . Peanut-Containing Drug Products Hives and Itching  . Tape Rash    Rash from tape wrapped around arm after giving blood     Current Outpatient Prescriptions  Medication Sig Dispense Refill  . aspirin EC 81 MG EC tablet Take 1 tablet (81 mg total) by mouth daily. (Patient taking differently: Take 81 mg by mouth at bedtime. )    . OVER THE COUNTER MEDICATION Take 80,000-240,000 Units by mouth 3 (three) times a week. Serra peptase (1 capsule 80,000 units)  - protein destroying enzymes for inflammation and cholesterol     No current facility-administered medications for this visit.      Past Surgical History:  Procedure Laterality Date  . CARDIAC CATHETERIZATION N/A 06/18/2014   Procedure: Right/Left Heart Cath and Coronary Angiography;  Surgeon: Jettie Booze, MD;  Location: [facility];  Service: Cardiovascular;  Laterality: N/A;  . COLONOSCOPY    . GROIN DEBRIDEMENT Right 12/23/2014   Procedure: GROIN  DEBRIDEMENT with wound VAC application;  Surgeon: [name], MD;  Location: [facility];  Service: Vascular;  Laterality: Right;  . MITRAL VALVE REPAIR Right 09/18/2014   Procedure: MINIMALLY INVASIVE MITRAL VALVE REPAIR (MVR);  Surgeon: [name], MD;  Location: [facility];  Service: Open Heart Surgery;  Laterality: Right;  . Nasal septrum  1970's  . TEE WITHOUT CARDIOVERSION N/A 06/11/2014   Procedure: TRANSESOPHAGEAL ECHOCARDIOGRAM (TEE);  Surgeon: Arnoldo Lenis, MD;  [facility];  Service: Cardiology;  Laterality: N/A;  . TEE WITHOUT CARDIOVERSION N/A 09/18/2014   Procedure: TRANSESOPHAGEAL ECHOCARDIOGRAM (TEE);  Surgeon: [name], MD;  Location: [facility];  Service: Open Heart Surgery;  Laterality: N/A;  . TEE WITHOUT CARDIOVERSION N/A 12/25/2014   Procedure: TRANSESOPHAGEAL ECHOCARDIOGRAM (TEE);  Surgeon: Josue Hector, MD;  Location: [facility];  Service: Cardiovascular;  Laterality: N/A;     Allergies  Allergen Reactions  . Peanut-Containing Drug Products Hives and Itching  . Tape Rash    Rash from tape wrapped around arm after giving blood      Family History  Problem Relation Age of Onset  . Heart failure Father   . Heart failure Paternal Grandmother      Social History Mr. Sarra reports that he has never smoked. He has never used smokeless tobacco. Mr. Windley reports that he does not drink alcohol.   Review of  Systems CONSTITUTIONAL: No weight loss, fever, chills, weakness or fatigue.  HEENT: Eyes: No visual loss, blurred vision, double vision or yellow sclerae.No hearing loss, sneezing, congestion, runny nose or sore throat.  SKIN: No rash or itching.  CARDIOVASCULAR: per HPI RESPIRATORY: No shortness of breath, cough or sputum.  GASTROINTESTINAL: No anorexia, nausea, vomiting or diarrhea. No abdominal pain or blood.  GENITOURINARY: No burning on urination, no polyuria NEUROLOGICAL: No headache, dizziness, syncope, paralysis, ataxia,  numbness or tingling in the extremities. No change in bowel or bladder control.  MUSCULOSKELETAL: No muscle, back pain, joint pain or stiffness.  LYMPHATICS: No enlarged nodes. No history of splenectomy.  PSYCHIATRIC: No history of depression or anxiety.  ENDOCRINOLOGIC: No reports of sweating, cold or heat intolerance. No polyuria or polydipsia.  Marland Kitchen   Physical Examination Vitals:   02/24/16 1611  BP: (!) 144/86  Pulse: 66   Vitals:   02/24/16 1611  Weight: 186 lb 12.8 oz (84.7 kg)  Height: 5\' 7"  (1.702 m)    Gen: resting comfortably, no acute distress HEENT: no scleral icterus, pupils equal round and reactive, no palptable cervical adenopathy,  CV: RRR, no m/r/g, no jvd Resp: Clear to auscultation bilaterally GI: abdomen is soft, non-tender, non-distended, normal bowel sounds, no hepatosplenomegaly MSK: extremities are warm, no edema.  Skin: warm, no rash Neuro:  no focal deficits Psych: appropriate affect   Diagnostic Studies 04/2014 echo Study Conclusions  - Left ventricle: The cavity size was normal. Systolic function was normal. The estimated ejection fraction was in the range of 60% to 65%. Wall motion was normal; there were no regional wall motion abnormalities. 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 Suggest TEE and consideration for repair if clinically indicated. - Left atrium: The atrium was mildly dilated. - Atrial septum: No defect or patent foramen ovale was identified.  05/2014 TEE Study Conclusions  - Left ventricle: The cavity size was normal. Wall thickness was normal. Systolic function was normal. The estimated ejection fraction was in the range of 60% to 65%. - Aortic valve: No evidence of vegetation. - Mitral valve: The prolapsing portion of the posterior leaflet creates two seperate jets.This is best seen in image 23 with TEE probe at 110 degree angle. There is a very eccentric  anterior jet that runs along the atrial wall and is difficult to quantify. There is a more centrally directed jet with a vena contracta of 0.4 cm. There was severe regurgitation. - Left atrium: The atrium was dilated. No evidence of thrombus in the appendage. - Right ventricle: The cavity size was mildly dilated. - Right atrium: No evidence of thrombus in the atrial cavity or appendage. - Atrial septum: No defect or patent foramen ovale was identified. Echo contrast study showed no right-to-left atrial level shunt, following an increase in RA pressure induced by provocative maneuvers. - Tricuspid valve: There was moderate regurgitation. The TR vena contracta is 0.4 cm.  Impressions:  - Severe eccentric MR. Eccentricity limits quantification, however regurgitation appears to be severe.   05/2014 Cath  No significant coronary artery disease.  Mild pulmonary artery hypertension.  Prominent V waves noted on wedge tracing. Mildly elevated LVEDP.  Pulmonary artery saturation 65%. Aortic saturation 96%. Cardiac output 4.2 L/m. Cardiac index 2.1.  Further plans per Dr. Harl Bowie regarding mitral valve repair. The patient will be aggressively hydrated due to some mild renal insufficiency. Cardiology f/u with Dr. Harl Bowie.   11/2014 Echo Study Conclusions  - Procedure narrative: Transthoracic  echocardiography. Image quality was adequate. The study was technically difficult. - Left ventricle: Systolic function was normal. The estimated ejection fraction was in the range of 50% to 55%. - Ventricular septum: Septal motion showed abnormal function, dyssynergy, and paradox. These changes are consistent with intraventricular conduction delay. - Mitral valve: Prior procedures included surgical repair. An annular ring prosthesis was present. - Tricuspid valve: Cannot fully exclude a vegetation. Suggest TEE. Mildly thickened leaflets. Mild, late  systolicprolapse. There was mild-moderate regurgitation directed centrally. - Pulmonary arteries: Systolic pressure was mildly increased. PA peak pressure: 38 mm Hg (S).  11/2014 TEE Study Conclusions  - Left ventricle: The cavity size was normal. Wall thickness was normal. Systolic function was normal. The estimated ejection fraction was in the range of 55% to 60%. - Aortic valve: No evidence of vegetation. - Mitral valve: Post repair with no significant regurgitation and no vegetation - Right atrium: No evidence of thrombus in the atrial cavity or appendage. - Atrial septum: No defect or patent foramen ovale was identified. - Tricuspid valve: Possible mild prolapse Moderate TR no vegation or evidence of SBE. - Pulmonic valve: No evidence of vegetation.     Assessment and Plan  1. Mitral regurgitation - s/p mitral valve repair - no recent symptoms - continue to monitor  2. CKD III - check labs   EKG in clinic shows SR, chronic RBBB  Fu 1 year. Check annual labs        Arnoldo Lenis, M.D.

## 2016-02-24 NOTE — Patient Instructions (Signed)
Your physician wants you to follow-up in: [place] DR. BRANCH You will receive a reminder letter in the mail two months in advance. If you don't receive a letter, please call our office to schedule the follow-up appointment.  Your physician recommends that you continue on your current medications as directed. Please refer to the Current Medication list given to you today.  Your physician recommends that you return for lab work PLEASE FAST [place] CBC/TSH/LIPIDS/BMP/MG/HGBA1C  Thank you for choosing [facility]!!

## 2016-02-27 ENCOUNTER — Other Ambulatory Visit: Payer: PPO | Admitting: *Deleted

## 2016-02-27 DIAGNOSIS — R509 Fever, unspecified: Secondary | ICD-10-CM | POA: Diagnosis not present

## 2016-02-27 DIAGNOSIS — G479 Sleep disorder, unspecified: Secondary | ICD-10-CM | POA: Diagnosis not present

## 2016-02-27 DIAGNOSIS — F329 Major depressive disorder, single episode, unspecified: Secondary | ICD-10-CM

## 2016-02-27 DIAGNOSIS — Z9889 Other specified postprocedural states: Secondary | ICD-10-CM

## 2016-02-27 DIAGNOSIS — E559 Vitamin D deficiency, unspecified: Secondary | ICD-10-CM | POA: Diagnosis not present

## 2016-02-27 DIAGNOSIS — R5383 Other fatigue: Secondary | ICD-10-CM | POA: Diagnosis not present

## 2016-02-27 DIAGNOSIS — F32A Depression, unspecified: Secondary | ICD-10-CM

## 2016-02-27 NOTE — Addendum Note (Signed)
Addended by: Eulis Foster on: 02/27/2016 10:21 AM   Modules accepted: Orders

## 2016-02-28 LAB — CBC WITH DIFFERENTIAL/PLATELET
BASOS ABS: 0.1 10*3/uL (ref 0.0–0.2)
Basos: 2 %
EOS (ABSOLUTE): 0.4 10*3/uL (ref 0.0–0.4)
EOS: 8 %
HEMOGLOBIN: 13.8 g/dL (ref 13.0–17.7)
Hematocrit: 40.8 % (ref 37.5–51.0)
IMMATURE GRANS (ABS): 0 10*3/uL (ref 0.0–0.1)
Immature Granulocytes: 0 %
LYMPHS ABS: 1.9 10*3/uL (ref 0.7–3.1)
Lymphs: 36 %
MCH: 31.2 pg (ref 26.6–33.0)
MCHC: 33.8 g/dL (ref 31.5–35.7)
MCV: 92 fL (ref 79–97)
MONOCYTES: 11 %
Monocytes Absolute: 0.6 10*3/uL (ref 0.1–0.9)
NEUTROS ABS: 2.3 10*3/uL (ref 1.4–7.0)
Neutrophils: 43 %
Platelets: 244 10*3/uL (ref 150–379)
RBC: 4.42 x10E6/uL (ref 4.14–5.80)
RDW: 12.9 % (ref 12.3–15.4)
WBC: 5.3 10*3/uL (ref 3.4–10.8)

## 2016-02-28 LAB — LIPID PANEL
CHOL/HDL RATIO: 3.3 ratio (ref 0.0–5.0)
CHOLESTEROL TOTAL: 137 mg/dL (ref 100–199)
HDL: 41 mg/dL (ref >39)
LDL CALC: 77 mg/dL (ref 0–99)
TRIGLYCERIDES: 94 mg/dL (ref 0–149)
VLDL CHOLESTEROL CAL: 19 mg/dL (ref 5–40)

## 2016-02-28 LAB — HEMOGLOBIN A1C
ESTIMATED AVERAGE GLUCOSE: 105 mg/dL
Hgb A1c MFr Bld: 5.3 % (ref 4.8–5.6)

## 2016-02-28 LAB — BASIC METABOLIC PANEL
BUN / CREAT RATIO: 12 (ref 10–24)
BUN: 19 mg/dL (ref 8–27)
CO2: 26 mmol/L (ref 18–29)
CREATININE: 1.56 mg/dL — AB (ref 0.76–1.27)
Calcium: 9.1 mg/dL (ref 8.6–10.2)
Chloride: 102 mmol/L (ref 96–106)
GFR calc non Af Amer: 45 mL/min/{1.73_m2} — ABNORMAL LOW (ref >59)
GFR, EST AFRICAN AMERICAN: 52 mL/min/{1.73_m2} — AB (ref >59)
Glucose: 88 mg/dL (ref 65–99)
Potassium: 4.5 mmol/L (ref 3.5–5.2)
SODIUM: 143 mmol/L (ref 134–144)

## 2016-02-28 LAB — TSH: TSH: 2.84 u[IU]/mL (ref 0.450–4.500)

## 2016-02-28 LAB — MAGNESIUM: MAGNESIUM: 2.1 mg/dL (ref 1.6–2.3)

## 2016-05-13 DIAGNOSIS — B029 Zoster without complications: Secondary | ICD-10-CM | POA: Diagnosis not present

## 2016-09-01 DIAGNOSIS — Z125 Encounter for screening for malignant neoplasm of prostate: Secondary | ICD-10-CM | POA: Diagnosis not present

## 2016-09-01 DIAGNOSIS — N289 Disorder of kidney and ureter, unspecified: Secondary | ICD-10-CM | POA: Diagnosis not present

## 2016-09-01 DIAGNOSIS — Z136 Encounter for screening for cardiovascular disorders: Secondary | ICD-10-CM | POA: Diagnosis not present

## 2016-09-01 DIAGNOSIS — Z0000 Encounter for general adult medical examination without abnormal findings: Secondary | ICD-10-CM | POA: Diagnosis not present

## 2016-09-01 DIAGNOSIS — Z1211 Encounter for screening for malignant neoplasm of colon: Secondary | ICD-10-CM | POA: Diagnosis not present

## 2016-10-05 DIAGNOSIS — N289 Disorder of kidney and ureter, unspecified: Secondary | ICD-10-CM | POA: Diagnosis not present

## 2017-05-04 ENCOUNTER — Ambulatory Visit: Payer: PPO | Admitting: Cardiology

## 2017-05-04 ENCOUNTER — Encounter: Payer: Self-pay | Admitting: Cardiology

## 2017-05-04 VITALS — BP 134/83 | HR 63 | Ht 67.0 in | Wt 186.8 lb

## 2017-05-04 DIAGNOSIS — I340 Nonrheumatic mitral (valve) insufficiency: Secondary | ICD-10-CM

## 2017-05-04 DIAGNOSIS — Z9889 Other specified postprocedural states: Secondary | ICD-10-CM | POA: Diagnosis not present

## 2017-05-04 NOTE — Progress Notes (Signed)
Clinical Summary [name] is a 72 y.o.male seen today for follow up of the following medical problems.   1. Mitral regurgitation - history of severe MR, he is s/p MV repair by Dr Roxy Manns 08/2014.   - no recent SOB/DOE. No recent edema.    2. Elevated bp - he reports history of white coat HTN  - checks at home typically 115-120/60-70s  Past Medical History:  Diagnosis Date  . Asthma    years ago  . Head injury, closed   . Heart murmur   . Hemorrhoids   . Lymphocele after surgical procedure 11/18/2014   Right groin lymphocele  . 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 Severe mitral regurgitation 05/16/2014  . Shortness of breath dyspnea    with exertion  . Sleep apnea    wears CPAP     Allergies  Allergen Reactions  . Peanut-Containing Drug Products Hives and Itching  . Tape Rash    Rash from tape wrapped around arm after giving blood     Current Outpatient Medications  Medication Sig Dispense Refill  . aspirin EC 81 MG EC tablet Take 1 tablet (81 mg total) by mouth daily. (Patient taking differently: Take 81 mg by mouth at bedtime. )    . MISC NATURAL PRODUCTS PO Carbon C 60 - 1 teaspoon daily mixed in olive oil    . OVER THE COUNTER MEDICATION Take 80,000-240,000 Units by mouth 3 (three) times a week. Serra peptase (1 capsule 80,000 units)  - protein destroying enzymes for inflammation and cholesterol     No current facility-administered medications for this visit.      Past Surgical History:  Procedure Laterality Date  . CARDIAC CATHETERIZATION N/A 06/18/2014   Procedure: Right/Left Heart Cath and Coronary Angiography;  Surgeon: Jettie Booze, MD;  Location: [facility];  Service: Cardiovascular;   Laterality: N/A;  . COLONOSCOPY    . GROIN DEBRIDEMENT Right 12/23/2014   Procedure: GROIN DEBRIDEMENT with wound VAC application;  Surgeon: [name], MD;  Location: [facility];  Service: Vascular;  Laterality: Right;  . MITRAL VALVE REPAIR Right 09/18/2014   Procedure: MINIMALLY INVASIVE MITRAL VALVE REPAIR (MVR);  Surgeon: [name], MD;  Location: [facility];  Service: Open Heart Surgery;  Laterality: Right;  . Nasal septrum  1970's  . TEE WITHOUT CARDIOVERSION N/A 06/11/2014   Procedure: TRANSESOPHAGEAL ECHOCARDIOGRAM (TEE);  Surgeon: Arnoldo Lenis, MD;  [facility];  Service: Cardiology;  Laterality: N/A;  . TEE WITHOUT CARDIOVERSION N/A 09/18/2014   Procedure: TRANSESOPHAGEAL ECHOCARDIOGRAM (TEE);  Surgeon: [name], MD;  Location: [facility];  Service: Open Heart Surgery;  Laterality: N/A;  . TEE WITHOUT CARDIOVERSION N/A 12/25/2014   Procedure: TRANSESOPHAGEAL ECHOCARDIOGRAM (TEE);  Surgeon: Josue Hector, MD;  Location[facility];  Service: Cardiovascular;  Laterality: N/A;     Allergies  Allergen Reactions  . Peanut-Containing Drug Products Hives and Itching  . Tape Rash    Rash from tape wrapped around arm after giving blood      Family History  Problem Relation Age of Onset  . Heart failure Father   . Heart failure Paternal Grandmother      Social History Mr. Diodato reports that he has never smoked. He has  never used smokeless tobacco. Mr. Mcauliffe reports that he does not drink alcohol.   Review of Systems CONSTITUTIONAL: No weight loss, fever, chills, weakness or fatigue.  HEENT: Eyes: No visual loss, blurred vision, double vision or yellow sclerae.No hearing loss, sneezing, congestion, runny nose or sore throat.  SKIN: No rash or itching.  CARDIOVASCULAR: per hpi RESPIRATORY: No shortness of breath, cough or sputum.  GASTROINTESTINAL: No anorexia, nausea, vomiting or diarrhea. No abdominal pain or blood.  GENITOURINARY: No burning on  urination, no polyuria NEUROLOGICAL: No headache, dizziness, syncope, paralysis, ataxia, numbness or tingling in the extremities. No change in bowel or bladder control.  MUSCULOSKELETAL: No muscle, back pain, joint pain or stiffness.  LYMPHATICS: No enlarged nodes. No history of splenectomy.  PSYCHIATRIC: No history of depression or anxiety.  ENDOCRINOLOGIC: No reports of sweating, cold or heat intolerance. No polyuria or polydipsia.  Marland Kitchen   Physical Examination Vitals:   05/04/17 1355  BP: 134/83  Pulse: 63  SpO2: 94%   Vitals:   05/04/17 1355  Weight: 186 lb 12.8 oz (84.7 kg)  Height: 5\' 7"  (1.702 m)     Gen: resting comfortably, no acute distress HEENT: no scleral icterus, pupils equal round and reactive, no palptable cervical adenopathy,  CV: RRR, no m/rg/, no jvd Resp: Clear to auscultation bilaterally GI: abdomen is soft, non-tender, non-distended, normal bowel sounds, no hepatosplenomegaly MSK: extremities are warm, no edema.  Skin: warm, no rash Neuro:  no focal deficits Psych: appropriate affect   Diagnostic Studies 04/2014 echo Study Conclusions  - Left ventricle: The cavity size was normal. Systolic function was normal. The estimated ejection fraction was in the range of 60% to 65%. Wall motion was normal; there were no regional wall motion abnormalities. 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 Suggest TEE and consideration for repair if clinically indicated. - Left atrium: The atrium was mildly dilated. - Atrial septum: No defect or patent foramen ovale was identified.  05/2014 TEE Study Conclusions  - Left ventricle: The cavity size was normal. Wall thickness was normal. Systolic function was normal. The estimated ejection fraction was in the range of 60% to 65%. - Aortic valve: No evidence of vegetation. - Mitral valve: The prolapsing portion of the posterior leaflet creates two seperate  jets.This is best seen in image 23 with TEE probe at 110 degree angle. There is a very eccentric anterior jet that runs along the atrial wall and is difficult to quantify. There is a more centrally directed jet with a vena contracta of 0.4 cm. There was severe regurgitation. - Left atrium: The atrium was dilated. No evidence of thrombus in the appendage. - Right ventricle: The cavity size was mildly dilated. - Right atrium: No evidence of thrombus in the atrial cavity or appendage. - Atrial septum: No defect or patent foramen ovale was identified. Echo contrast study showed no right-to-left atrial level shunt, following an increase in RA pressure induced by provocative maneuvers. - Tricuspid valve: There was moderate regurgitation. The TR vena contracta is 0.4 cm.  Impressions:  - Severe eccentric MR. Eccentricity limits quantification, however regurgitation appears to be severe.   05/2014 Cath  No significant coronary artery disease.  Mild pulmonary artery hypertension.  Prominent V waves noted on wedge tracing. Mildly elevated LVEDP.  Pulmonary artery saturation 65%. Aortic saturation 96%. Cardiac output 4.2 L/m. Cardiac index 2.1.  Further plans per Dr. Harl Bowie regarding mitral valve repair. The patient will be aggressively hydrated due to  some mild renal insufficiency. Cardiology f/u with Dr. Harl Bowie.   11/2014 Echo Study Conclusions  - Procedure narrative: Transthoracic echocardiography. Image quality was adequate. The study was technically difficult. - Left ventricle: Systolic function was normal. The estimated ejection fraction was in the range of 50% to 55%. - Ventricular septum: Septal motion showed abnormal function, dyssynergy, and paradox. These changes are consistent with intraventricular conduction delay. - Mitral valve: Prior procedures included surgical repair. An annular ring prosthesis was present. - Tricuspid  valve: Cannot fully exclude a vegetation. Suggest TEE. Mildly thickened leaflets. Mild, late systolicprolapse. There was mild-moderate regurgitation directed centrally. - Pulmonary arteries: Systolic pressure was mildly increased. PA peak pressure: 38 mm Hg (S).  11/2014 TEE Study Conclusions  - Left ventricle: The cavity size was normal. Wall thickness was normal. Systolic function was normal. The estimated ejection fraction was in the range of 55% to 60%. - Aortic valve: No evidence of vegetation. - Mitral valve: Post repair with no significant regurgitation and no vegetation - Right atrium: No evidence of thrombus in the atrial cavity or appendage. - Atrial septum: No defect or patent foramen ovale was identified. - Tricuspid valve: Possible mild prolapse Moderate TR no vegation or evidence of SBE. - Pulmonic valve: No evidence of vegetation.         Assessment and Plan   1. Mitral regurgitation - s/p mitral valve repair - doing well without symptoms - continue to monitor at this time.     Obtain annual labs   Arnoldo Lenis, M.D.

## 2017-05-04 NOTE — Patient Instructions (Signed)
Your physician wants you to follow-up in: [place] will receive a reminder letter in the mail two months in advance. If you don't receive a letter, please call our office to schedule the follow-up appointment.  Your physician recommends that you continue on your current medications as directed. Please refer to the Current Medication list given to you today.  Your physician recommends that you return for lab work - PLEASE FAST 6-8 HOURS PRIOR TO LAB WORK - [place]   Thank you for choosing [facility]!!

## 2017-05-08 ENCOUNTER — Encounter: Payer: Self-pay | Admitting: Cardiology

## 2017-06-03 ENCOUNTER — Other Ambulatory Visit: Payer: PPO | Admitting: *Deleted

## 2017-06-03 ENCOUNTER — Other Ambulatory Visit: Payer: Self-pay | Admitting: *Deleted

## 2017-06-03 DIAGNOSIS — I340 Nonrheumatic mitral (valve) insufficiency: Secondary | ICD-10-CM

## 2017-06-03 DIAGNOSIS — Z9889 Other specified postprocedural states: Secondary | ICD-10-CM | POA: Diagnosis not present

## 2017-06-03 DIAGNOSIS — R5383 Other fatigue: Secondary | ICD-10-CM

## 2017-06-04 LAB — BASIC METABOLIC PANEL
BUN/Creatinine Ratio: 13 (ref 10–24)
BUN: 22 mg/dL (ref 8–27)
CALCIUM: 9.3 mg/dL (ref 8.6–10.2)
CHLORIDE: 105 mmol/L (ref 96–106)
CO2: 25 mmol/L (ref 20–29)
CREATININE: 1.71 mg/dL — AB (ref 0.76–1.27)
GFR calc non Af Amer: 39 mL/min/{1.73_m2} — ABNORMAL LOW (ref >59)
GFR, EST AFRICAN AMERICAN: 46 mL/min/{1.73_m2} — AB (ref >59)
GLUCOSE: 87 mg/dL (ref 65–99)
Potassium: 4.8 mmol/L (ref 3.5–5.2)
Sodium: 142 mmol/L (ref 134–144)

## 2017-06-04 LAB — CBC
HEMATOCRIT: 42.6 % (ref 37.5–51.0)
HEMOGLOBIN: 14.7 g/dL (ref 13.0–17.7)
MCH: 32.5 pg (ref 26.6–33.0)
MCHC: 34.5 g/dL (ref 31.5–35.7)
MCV: 94 fL (ref 79–97)
Platelets: 287 10*3/uL (ref 150–379)
RBC: 4.53 x10E6/uL (ref 4.14–5.80)
RDW: 12.4 % (ref 12.3–15.4)
WBC: 6.3 10*3/uL (ref 3.4–10.8)

## 2017-06-04 LAB — LIPID PANEL
CHOL/HDL RATIO: 3.9 ratio (ref 0.0–5.0)
Cholesterol, Total: 161 mg/dL (ref 100–199)
HDL: 41 mg/dL (ref >39)
LDL Calculated: 105 mg/dL — ABNORMAL HIGH (ref 0–99)
Triglycerides: 73 mg/dL (ref 0–149)
VLDL Cholesterol Cal: 15 mg/dL (ref 5–40)

## 2017-06-04 LAB — MAGNESIUM: MAGNESIUM: 2.1 mg/dL (ref 1.6–2.3)

## 2017-06-04 LAB — TSH: TSH: 2.01 u[IU]/mL (ref 0.450–4.500)

## 2017-06-04 LAB — HEMOGLOBIN A1C
ESTIMATED AVERAGE GLUCOSE: 111 mg/dL
Hgb A1c MFr Bld: 5.5 % (ref 4.8–5.6)

## 2017-06-05 IMAGING — US US PELVIS LIMITED
1 series · 14 of 17 positions shown · non-contrast
Comparison: CT of the abdomen and pelvis from 10/22/2014

CLINICAL DATA: Chronic right inguinal swelling since surgery in
August 2014, acutely worsened. Fever and chills for 2 days. Initial
encounter.

EXAM:
LIMITED ULTRASOUND OF PELVIS
TECHNIQUE: Limited transabdominal ultrasound examination of the pelvis was
performed.

[Series 1: us pelvis limited · 0.14mm/px · 14 of 17 slices shown]
[im 1/17]
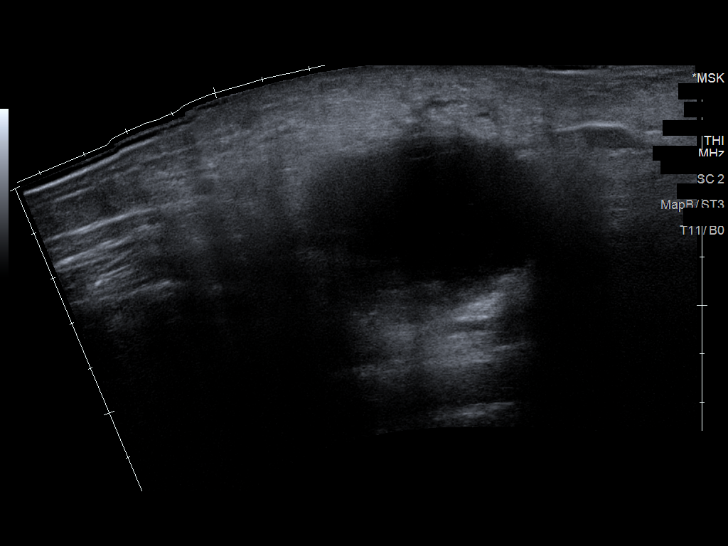
[im 2/17]
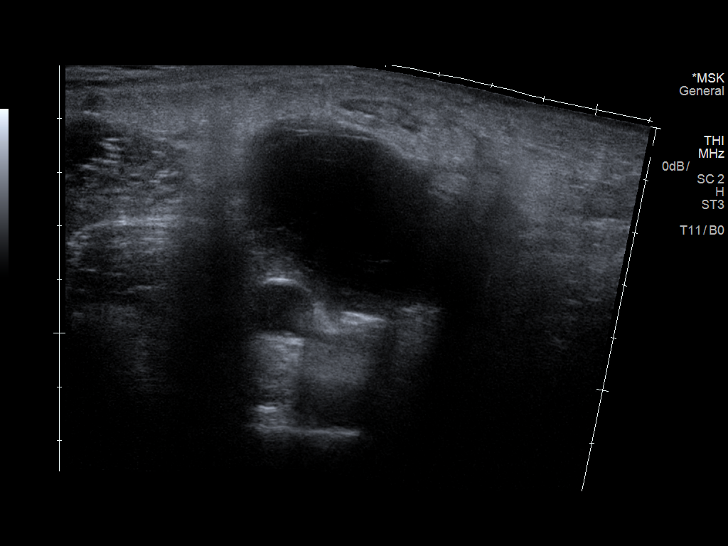
[im 4/17]
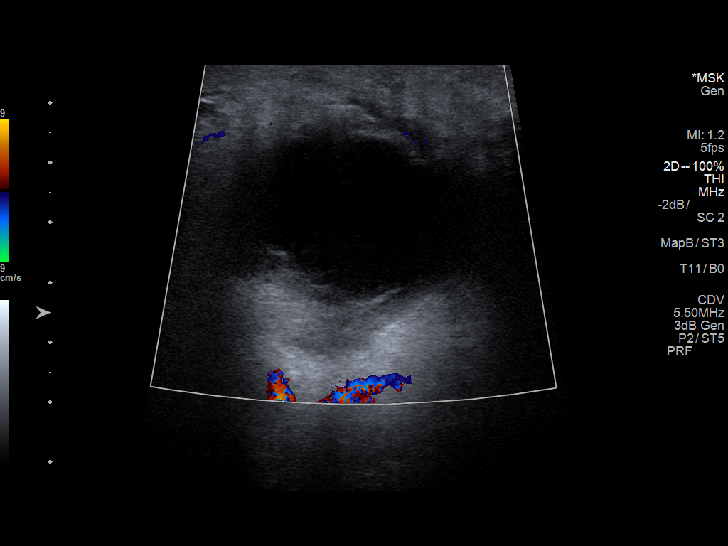
[im 5/17]
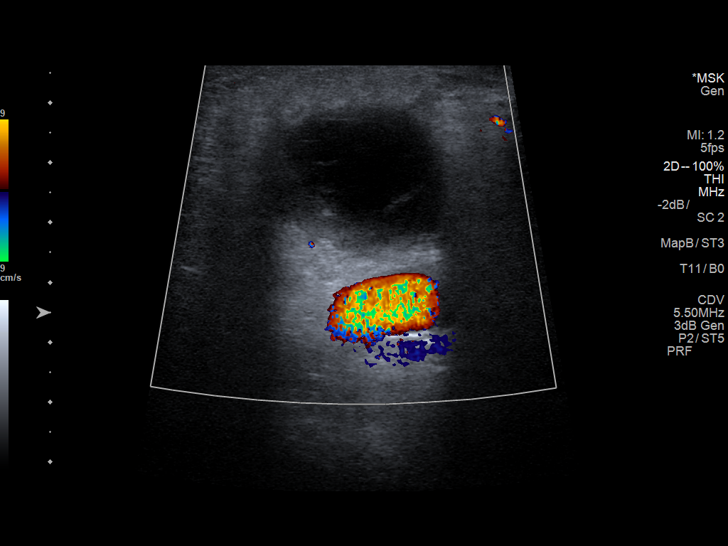
[im 6/17]
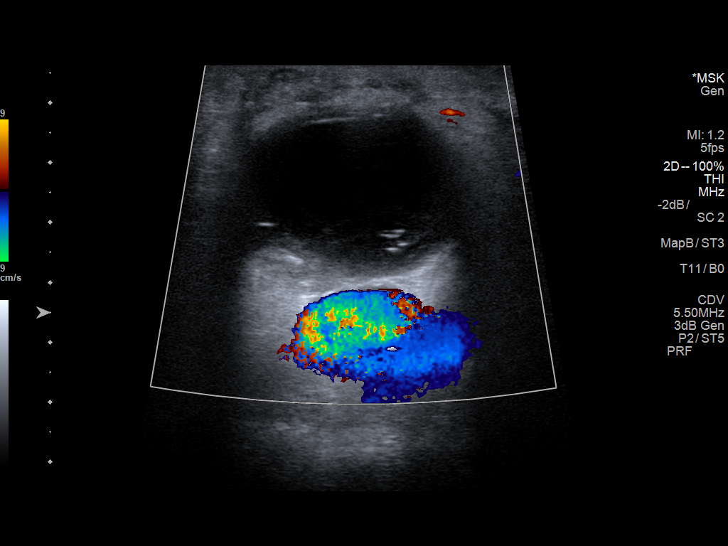
[im 7/17]
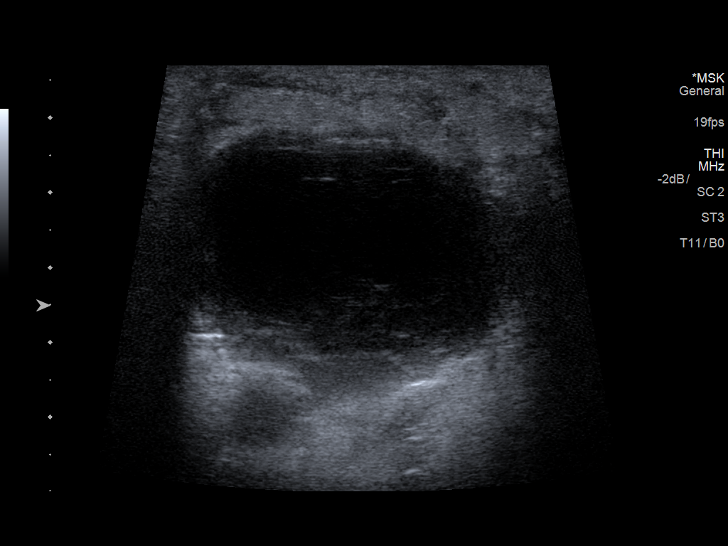
[im 8/17]
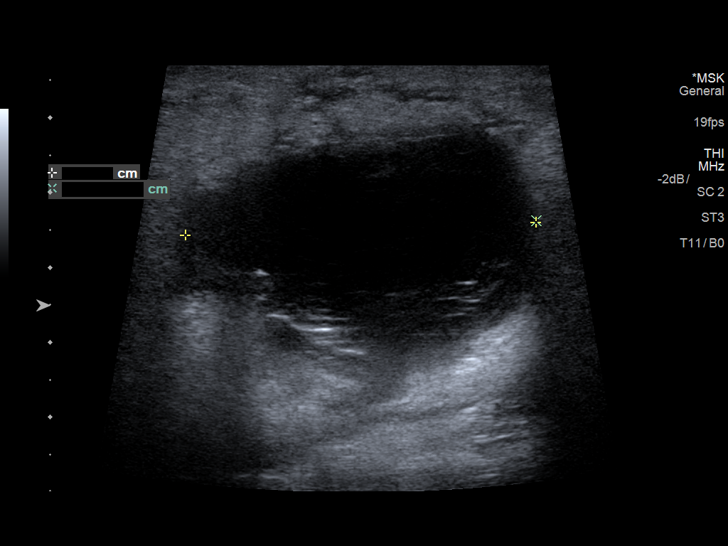
[im 10/17]
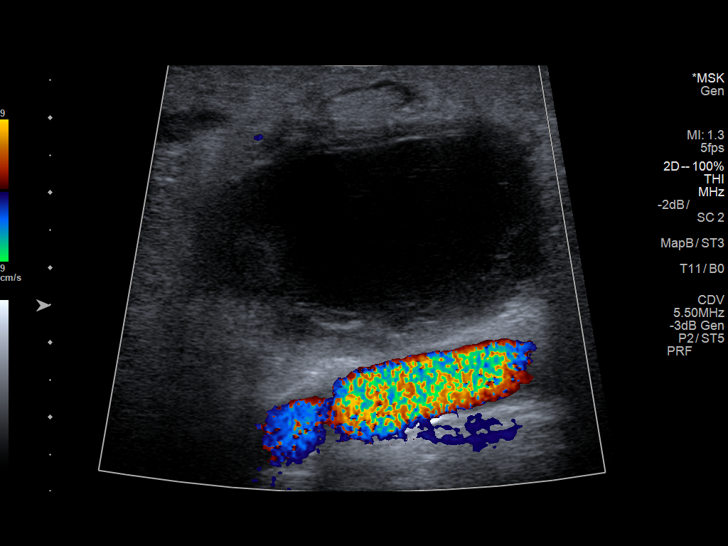
[im 11/17]
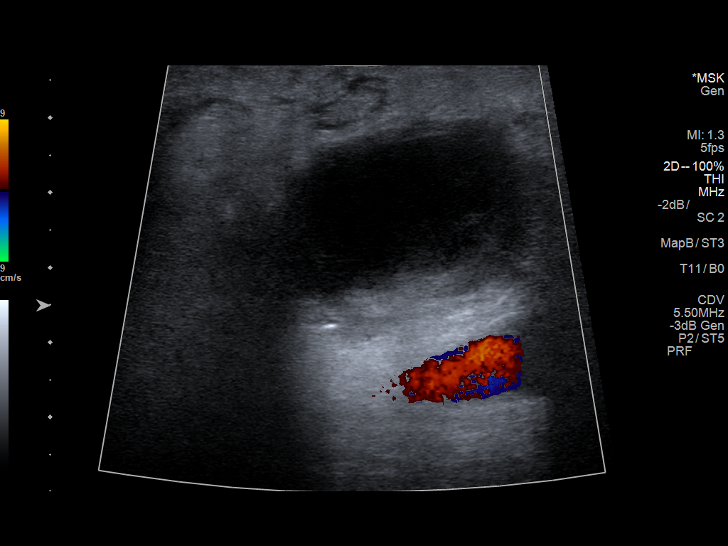
[im 12/17]
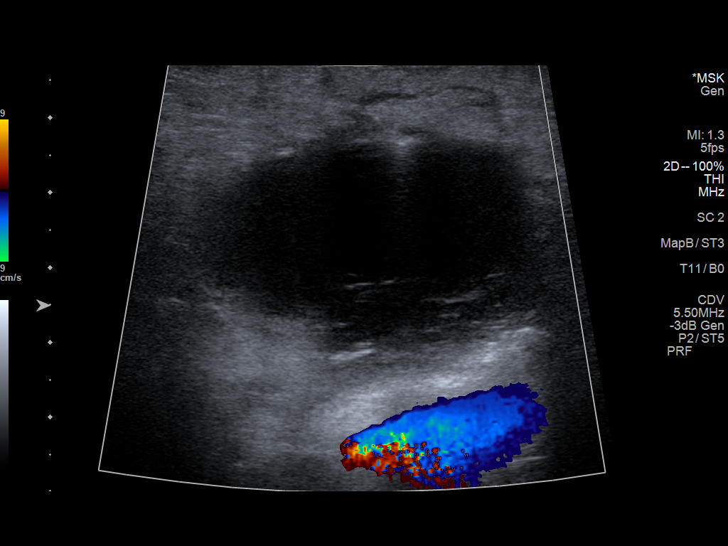
[im 13/17]
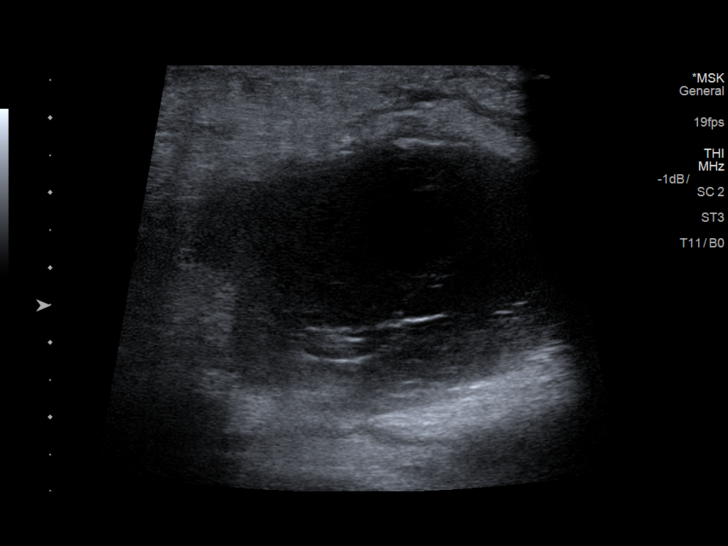
[im 14/17]
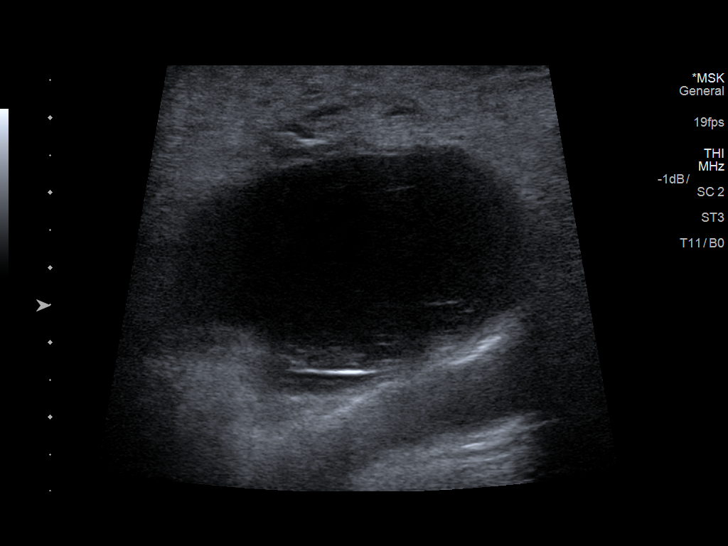
[im 16/17]
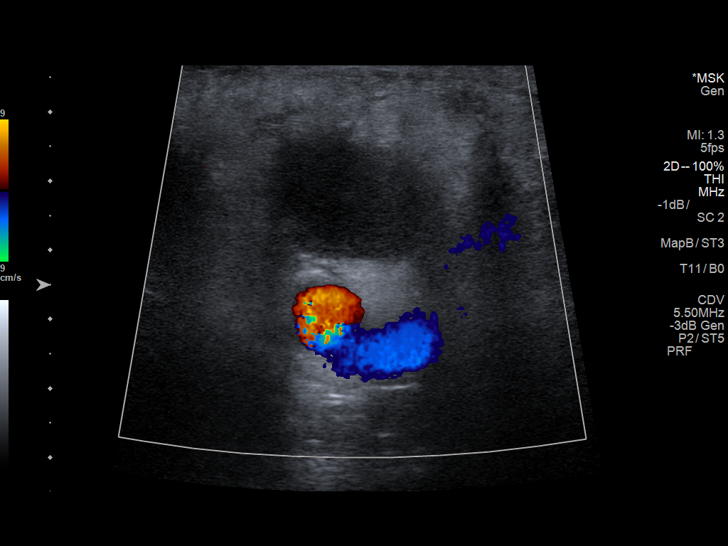
[im 17/17]
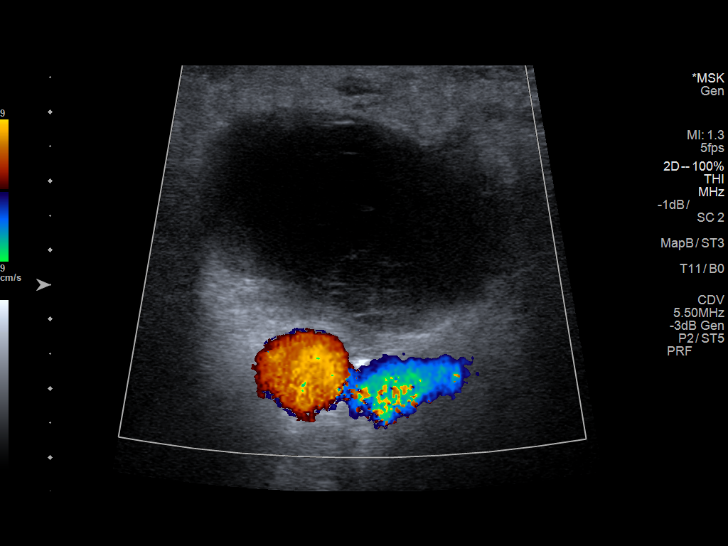

[14 of 17 positions shown; findings below may reference images not displayed]

FINDINGS: There is a mildly complex collection of fluid and debris at the
right inguinal region, measuring 4.7 x 4.4 x 3.5 cm. No associated
blood flow is seen on limited Doppler evaluation, and this is not
directly adjacent to a blood vessel. This may reflect a
postoperative seroma. There is no evidence for pseudoaneurysm.
IMPRESSION: Mildly complex collection of fluid and debris noted at the right
inguinal region, measuring 4.7 cm, likely reflecting a postoperative
seroma. No associated blood flow seen.

## 2017-06-08 ENCOUNTER — Telehealth: Payer: Self-pay | Admitting: Cardiology

## 2017-06-08 NOTE — Telephone Encounter (Signed)
Patient returned someone's call °

## 2017-06-09 NOTE — Telephone Encounter (Signed)
-----   Message from Arnoldo Lenis, MD sent at 06/08/2017  2:35 PM EDT ----- Labs overall look good. Mildly decreased kindey function but similar to prior blood tests. Everything else is normal  Zandra Abts MD

## 2017-06-09 NOTE — Telephone Encounter (Signed)
Pt aware - routed to pcp  

## 2017-09-05 DIAGNOSIS — N289 Disorder of kidney and ureter, unspecified: Secondary | ICD-10-CM | POA: Diagnosis not present

## 2017-09-05 DIAGNOSIS — Z125 Encounter for screening for malignant neoplasm of prostate: Secondary | ICD-10-CM | POA: Diagnosis not present

## 2017-09-05 DIAGNOSIS — Z1211 Encounter for screening for malignant neoplasm of colon: Secondary | ICD-10-CM | POA: Diagnosis not present

## 2017-09-05 DIAGNOSIS — Z0000 Encounter for general adult medical examination without abnormal findings: Secondary | ICD-10-CM | POA: Diagnosis not present

## 2018-11-07 DIAGNOSIS — D1801 Hemangioma of skin and subcutaneous tissue: Secondary | ICD-10-CM | POA: Diagnosis not present

## 2018-11-07 DIAGNOSIS — L738 Other specified follicular disorders: Secondary | ICD-10-CM | POA: Diagnosis not present

## 2018-11-07 DIAGNOSIS — L821 Other seborrheic keratosis: Secondary | ICD-10-CM | POA: Diagnosis not present

## 2018-11-07 DIAGNOSIS — L820 Inflamed seborrheic keratosis: Secondary | ICD-10-CM | POA: Diagnosis not present

## 2018-11-07 DIAGNOSIS — L570 Actinic keratosis: Secondary | ICD-10-CM | POA: Diagnosis not present

## 2018-11-07 DIAGNOSIS — Z85828 Personal history of other malignant neoplasm of skin: Secondary | ICD-10-CM | POA: Diagnosis not present

## 2018-11-22 ENCOUNTER — Other Ambulatory Visit: Payer: Self-pay

## 2018-11-22 ENCOUNTER — Encounter: Payer: Self-pay | Admitting: Cardiology

## 2018-11-22 ENCOUNTER — Ambulatory Visit: Payer: PPO | Admitting: Cardiology

## 2018-11-22 VITALS — BP 150/88 | HR 76 | Temp 97.9°F | Ht 68.0 in | Wt 187.0 lb

## 2018-11-22 DIAGNOSIS — Z9889 Other specified postprocedural states: Secondary | ICD-10-CM | POA: Diagnosis not present

## 2018-11-22 DIAGNOSIS — I340 Nonrheumatic mitral (valve) insufficiency: Secondary | ICD-10-CM | POA: Diagnosis not present

## 2018-11-22 NOTE — Patient Instructions (Signed)

## 2018-11-22 NOTE — Progress Notes (Signed)
Clinical Summary Jonathan Church is a 73 y.o.male  seen today for follow up of the following medical problems.   1. Mitral regurgitation s/ MV repair - history of severe MR, he is s/p MV repair by Dr Roxy Manns 08/2014.  - no recent SOB or DOE. No recent edema.    2. Elevated bp - he reports history of white coat HTN  - home bp's 120s/70s.   SH: works as Development worker, community, has his own company    Past Medical History:  Diagnosis Date  . Asthma    years ago  . Head injury, closed   . Heart murmur   . Hemorrhoids   . Lymphocele after surgical procedure 11/18/2014   Right groin lymphocele  . 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 Severe mitral regurgitation 05/16/2014  . Shortness of breath dyspnea    with exertion  . Sleep apnea    wears CPAP     Allergies  Allergen Reactions  . Peanut-Containing Drug Products Hives and Itching  . Tape Rash    Rash from tape wrapped around arm after giving blood     Current Outpatient Medications  Medication Sig Dispense Refill  . MISC NATURAL PRODUCTS PO Carbon C 60 - 1 teaspoon daily mixed in olive oil    . OVER THE COUNTER MEDICATION Take 80,000-240,000 Units by mouth 3 (three) times a week. Serra peptase (1 capsule 80,000 units)  - protein destroying enzymes for inflammation and cholesterol     No current facility-administered medications for this visit.      Past Surgical History:  Procedure Laterality Date  . CARDIAC CATHETERIZATION N/A 06/18/2014   Procedure: Right/Left Heart Cath and Coronary Angiography;  Surgeon: Jettie Booze, MD;  Location: [facility];  Service: Cardiovascular;  Laterality: N/A;  . COLONOSCOPY    . GROIN DEBRIDEMENT Right 12/23/2014   Procedure: GROIN  DEBRIDEMENT with wound VAC application;  Surgeon: [name], MD;  Location: [facility];  Service: Vascular;  Laterality: Right;  . MITRAL VALVE REPAIR Right 09/18/2014   Procedure: MINIMALLY INVASIVE MITRAL VALVE REPAIR (MVR);  Surgeon: [name], MD;  Location: [facility];  Service: Open Heart Surgery;  Laterality: Right;  . Nasal septrum  1970's  . TEE WITHOUT CARDIOVERSION N/A 06/11/2014   Procedure: TRANSESOPHAGEAL ECHOCARDIOGRAM (TEE);  Surgeon: Arnoldo Lenis, MD;  [facility];  Service: Cardiology;  Laterality: N/A;  . TEE WITHOUT CARDIOVERSION N/A 09/18/2014   Procedure: TRANSESOPHAGEAL ECHOCARDIOGRAM (TEE);  Surgeon: [name], MD;  Location: [place];  Service: Open Heart Surgery;  Laterality: N/A;  . TEE WITHOUT CARDIOVERSION N/A 12/25/2014   Procedure: TRANSESOPHAGEAL ECHOCARDIOGRAM (TEE);  Surgeon: Josue Hector, MD;  Location: [facility];  Service: Cardiovascular;  Laterality: N/A;     Allergies  Allergen Reactions  . Peanut-Containing Drug Products Hives and Itching  . Tape Rash    Rash from tape wrapped around arm after giving blood      Family History  Problem Relation Age of Onset  . Heart failure Father   . Heart failure Paternal Grandmother      Social History Mr. Awad reports that he has never smoked. He has never used smokeless tobacco. Mr. Situ reports no history of alcohol use.   Review  of Systems CONSTITUTIONAL: No weight loss, fever, chills, weakness or fatigue.  HEENT: Eyes: No visual loss, blurred vision, double vision or yellow sclerae.No hearing loss, sneezing, congestion, runny nose or sore throat.  SKIN: No rash or itching.  CARDIOVASCULAR: per hpi RESPIRATORY: No shortness of breath, cough or sputum.  GASTROINTESTINAL: No anorexia, nausea, vomiting or diarrhea. No abdominal pain or blood.  GENITOURINARY: No burning on urination, no polyuria NEUROLOGICAL: No headache, dizziness, syncope, paralysis, ataxia, numbness or  tingling in the extremities. No change in bowel or bladder control.  MUSCULOSKELETAL: No muscle, back pain, joint pain or stiffness.  LYMPHATICS: No enlarged nodes. No history of splenectomy.  PSYCHIATRIC: No history of depression or anxiety.  ENDOCRINOLOGIC: No reports of sweating, cold or heat intolerance. No polyuria or polydipsia.  Marland Kitchen   Physical Examination Vitals:   11/22/18 1412  BP: (!) 150/88  Pulse: 76  Temp: 97.9 F (36.6 C)  SpO2: 97%   Filed Weights   11/22/18 1412  Weight: 187 lb (84.8 kg)    Gen: resting comfortably, no acute distress HEENT: no scleral icterus, pupils equal round and reactive, no palptable cervical adenopathy,  CV: RRR, no m/r/g no jvd Resp: Clear to auscultation bilaterally GI: abdomen is soft, non-tender, non-distended, normal bowel sounds, no hepatosplenomegaly MSK: extremities are warm, no edema.  Skin: warm, no rash Neuro:  no focal deficits Psych: appropriate affect      Assessment and Plan  1. Mitral regurgitation s/p MV repair - continues to do well, no recent symptoms - continue to monitor at this time  2. Elevated blood pressure - has some component of white coat HTN, home bp's remain at goal - continue to monitor at this time.    F/u 1 year  Arnoldo Lenis, M.D.

## 2019-03-02 DIAGNOSIS — E785 Hyperlipidemia, unspecified: Secondary | ICD-10-CM | POA: Diagnosis not present

## 2019-03-02 DIAGNOSIS — Z125 Encounter for screening for malignant neoplasm of prostate: Secondary | ICD-10-CM | POA: Diagnosis not present

## 2019-03-02 DIAGNOSIS — N289 Disorder of kidney and ureter, unspecified: Secondary | ICD-10-CM | POA: Diagnosis not present

## 2019-03-02 DIAGNOSIS — Z0000 Encounter for general adult medical examination without abnormal findings: Secondary | ICD-10-CM | POA: Diagnosis not present

## 2019-03-02 DIAGNOSIS — R5383 Other fatigue: Secondary | ICD-10-CM | POA: Diagnosis not present

## 2019-03-02 DIAGNOSIS — I340 Nonrheumatic mitral (valve) insufficiency: Secondary | ICD-10-CM | POA: Diagnosis not present

## 2019-03-02 DIAGNOSIS — Z1211 Encounter for screening for malignant neoplasm of colon: Secondary | ICD-10-CM | POA: Diagnosis not present

## 2019-03-02 DIAGNOSIS — G4733 Obstructive sleep apnea (adult) (pediatric): Secondary | ICD-10-CM | POA: Diagnosis not present

## 2019-03-19 DIAGNOSIS — Z0000 Encounter for general adult medical examination without abnormal findings: Secondary | ICD-10-CM | POA: Diagnosis not present

## 2019-03-19 DIAGNOSIS — R5383 Other fatigue: Secondary | ICD-10-CM | POA: Diagnosis not present

## 2019-03-19 DIAGNOSIS — I340 Nonrheumatic mitral (valve) insufficiency: Secondary | ICD-10-CM | POA: Diagnosis not present

## 2019-03-19 DIAGNOSIS — E785 Hyperlipidemia, unspecified: Secondary | ICD-10-CM | POA: Diagnosis not present

## 2019-03-19 DIAGNOSIS — Z13220 Encounter for screening for lipoid disorders: Secondary | ICD-10-CM | POA: Diagnosis not present

## 2019-03-19 DIAGNOSIS — Z136 Encounter for screening for cardiovascular disorders: Secondary | ICD-10-CM | POA: Diagnosis not present

## 2019-03-19 DIAGNOSIS — Z125 Encounter for screening for malignant neoplasm of prostate: Secondary | ICD-10-CM | POA: Diagnosis not present

## 2019-05-10 ENCOUNTER — Other Ambulatory Visit: Payer: Self-pay | Admitting: Urology

## 2019-05-10 DIAGNOSIS — R972 Elevated prostate specific antigen [PSA]: Secondary | ICD-10-CM | POA: Diagnosis not present

## 2019-05-10 DIAGNOSIS — N5201 Erectile dysfunction due to arterial insufficiency: Secondary | ICD-10-CM | POA: Diagnosis not present

## 2019-05-18 DIAGNOSIS — Z1211 Encounter for screening for malignant neoplasm of colon: Secondary | ICD-10-CM | POA: Diagnosis not present

## 2019-06-07 ENCOUNTER — Ambulatory Visit
Admission: RE | Admit: 2019-06-07 | Discharge: 2019-06-07 | Disposition: A | Discharge status: Home / Self Care | Payer: PPO | Source: Ambulatory Visit | Attending: Urology | Admitting: Urology

## 2019-06-07 DIAGNOSIS — N400 Enlarged prostate without lower urinary tract symptoms: Secondary | ICD-10-CM | POA: Diagnosis not present

## 2019-06-07 DIAGNOSIS — R972 Elevated prostate specific antigen [PSA]: Secondary | ICD-10-CM

## 2019-06-07 MED ORDER — GADOBENATE DIMEGLUMINE 529 MG/ML IV SOLN
18.0000 mL | Freq: Once | INTRAVENOUS | Status: AC | PRN
  Administered 2019-06-07: 18 mL via INTRAVENOUS

## 2019-11-22 ENCOUNTER — Ambulatory Visit: Payer: PPO | Admitting: Family Medicine

## 2019-12-12 NOTE — Progress Notes (Signed)
Cardiology Office Note  Date: 12/13/2019   ID: Jonathan Church, DOB [birth year], MRN [id number]  PCP:  London Pepper, MD  Cardiologist:  Carlyle Dolly, MD Electrophysiologist:  None   Chief Complaint: Follow-up mitral valve insufficiency status post mitral valve repair.  History of Present Illness: Jonathan Church is a 74 y.o. male with a history of mitral valve insufficiency status post mitral valve repair, elevated BP.  Last encounter with Dr. Harl Bowie 11/22/2018.  He was doing well with no current symptoms.  He was continuing to monitor.  Patient has some element of whitecoat hypertension.  Home blood pressures have remained at goal.  Continuing to monitor.  Patient is here for complaints of recent increase in shortness of breath/dyspnea on exertion.  States he is nearly certain he had the Covid virus a few weeks ago lasting approximately 3 weeks with symptoms of shortness of breath, loss of taste and smell.  He states he self treated with ivermectin and doxycycline.  He states he obtained both of these medications from [place] and self treated.  He denies any other anginal symptoms, palpitations or arrhythmias, orthostatic symptoms, CVA or TIA-like symptoms, bleeding issues, denies any PND, orthopnea.  Denies any claudication-like symptoms, DVT or PE-like symptoms.  Denies any lower extremity edema.  He is concerned the Covid infection may have affected his heart.  He has had previous mitral valve repair with annuloplasty/valvuloplasty   Past Medical History:  Diagnosis Date  . Asthma    years ago  . Head injury, closed   . Heart murmur   . Hemorrhoids   . Lymphocele after surgical procedure 11/18/2014   Right groin lymphocele  . S/P minimally invasive mitral valve repair 09/18/2014   Complex valvuloplasty including triangular resection of flail segment of posterior leaflet, artificial Gore-tex neochord placement x4 and 32 mm Sorin Memo 3D Rechord ring annuloplasty via  right mini thoracotomy approach  . Severe mitral regurgitation 05/16/2014  . Shortness of breath dyspnea    with exertion  . Sleep apnea    wears CPAP    Past Surgical History:  Procedure Laterality Date  . CARDIAC CATHETERIZATION N/A 06/18/2014   Procedure: Right/Left Heart Cath and Coronary Angiography;  Surgeon: Jettie Booze, MD;  Location: [facility];  Service: Cardiovascular;  Laterality: N/A;  . COLONOSCOPY    . GROIN DEBRIDEMENT Right 12/23/2014   Procedure: GROIN DEBRIDEMENT with wound VAC application;  Surgeon: [name], MD;  Location: [facility];  Service: Vascular;  Laterality: Right;  . MITRAL VALVE REPAIR Right 09/18/2014   Procedure: MINIMALLY INVASIVE MITRAL VALVE REPAIR (MVR);  Surgeon: [name], MD;  Location: [place];  Service: Open Heart Surgery;  Laterality: Right;  . Nasal septrum  1970's  . TEE WITHOUT CARDIOVERSION N/A 06/11/2014   Procedure: TRANSESOPHAGEAL ECHOCARDIOGRAM (TEE);  Surgeon: Arnoldo Lenis, MD;  [facility];  Service: Cardiology;  Laterality: N/A;  . TEE WITHOUT CARDIOVERSION N/A 09/18/2014   Procedure: TRANSESOPHAGEAL ECHOCARDIOGRAM (TEE);  Surgeon: [name], MD;  Location: [place];  Service: Open Heart Surgery;  Laterality: N/A;  . TEE WITHOUT CARDIOVERSION N/A 12/25/2014   Procedure: TRANSESOPHAGEAL ECHOCARDIOGRAM (TEE);  Surgeon: Josue Hector, MD;  Location: [facility];  Service: Cardiovascular;  Laterality: N/A;    Current Outpatient Medications  Medication Sig Dispense Refill  . MISC NATURAL PRODUCTS PO Carbon C 60 - 1 teaspoon daily mixed in olive oil    . OVER THE COUNTER MEDICATION  Take 80,000-240,000 Units by mouth 3 (three) times a week. Serra peptase (1 capsule 80,000 units)  - protein destroying enzymes for inflammation and cholesterol     No current facility-administered medications for this visit.   Allergies:  Peanut-containing drug products and Tape   Social History: The patient  reports  that he has never smoked. He has never used smokeless tobacco. He reports that he does not drink alcohol and does not use drugs.   Family History: The patient's family history includes Heart failure in his father and paternal grandmother.   ROS:  Please see the history of present illness. Otherwise, complete review of systems is positive for none.  All other systems are reviewed and negative.   Physical Exam: VS:  BP 120/82   Pulse 61   Ht 5\' 8"  (1.727 m)   Wt 187 lb 3.2 oz (84.9 kg)   SpO2 95%   BMI 28.46 kg/m , BMI Body mass index is 28.46 kg/m.  Wt Readings from Last 3 Encounters:  12/13/19 187 lb 3.2 oz (84.9 kg)  11/22/18 187 lb (84.8 kg)  05/04/17 186 lb 12.8 oz (84.7 kg)    General: Patient appears comfortable at rest. Neck: Supple, no elevated JVP or carotid bruits, no thyromegaly. Lungs: Clear to auscultation, nonlabored breathing at rest. Cardiac: Regular rate and rhythm, no S3 or significant systolic murmur, no pericardial rub. Extremities: No pitting edema, distal pulses 2+. Skin: Warm and dry. Musculoskeletal: No kyphosis. Neuropsychiatric: Alert and oriented x3, affect grossly appropriate.  ECG:  An ECG dated 12/13/2019 was personally reviewed today and demonstrated:  Normal sinus rhythm, left axis deviation, right bundle branch block rate of 60 bpm  Recent Labwork: No results found for requested labs within last 8760 hours.     Component Value Date/Time   CHOL 161 06/03/2017 1028   TRIG 73 06/03/2017 1028   HDL 41 06/03/2017 1028   CHOLHDL 3.9 06/03/2017 1028   LDLCALC 105 (H) 06/03/2017 1028    Other Studies Reviewed Today:  Study Conclusions   - Left ventricle: The cavity size was normal. Wall thickness was  normal. Systolic function was normal. The estimated ejection  fraction was in the range of 55% to 60%.  - Aortic valve: No evidence of vegetation.  - Mitral valve: Post repair with no significant regurgitation and  no vegetation  - Right  atrium: No evidence of thrombus in the atrial cavity or  appendage.  - Atrial septum: No defect or patent foramen ovale was identified.  - Tricuspid valve: Possible mild prolapse Moderate TR no vegation  or evidence of SBE.  - Pulmonic valve: No evidence of vegetation.   Assessment and Plan:   1. S/P minimally invasive mitral valve repair Status post valvuloplasty/annuloplasty for severe mitral valve regurgitation.  Had a Complex valvuloplasty including triangular resection of a flail segment of posterior leaflet, artificial Gore-Tex neocord placement x4 and 32 mm Sorin memo 3D record ring annuloplasty 2016  2. Elevated BP without diagnosis of hypertension Blood pressure today 120/82.  He is not on any antihypertensive medication.  3. DOE (dyspnea on exertion) States he is having increased dyspnea on exertion.  States before Covid infection he was having no trouble mowing his lawn.  States he now has issues where he has to rest at intervals after 75 yards of using a push mower.  Please get echocardiogram to reassess LV function, diastolic function, and valvular function  4. SOB (shortness of breath) Describe some resting shortness of breath  after recent suspected Covid infection.  See #3 above  Medication Adjustments/Labs and Tests Ordered: Current medicines are reviewed at length with the patient today.  Concerns regarding medicines are outlined above.   Disposition: Follow-up with Dr. Harl Bowie 6 months  Signed, Levell July, NP 12/13/2019 9:30 AM    [facility] [place], [place], [place] [address] Phone: [phone]; Fax: [phone]

## 2019-12-13 ENCOUNTER — Ambulatory Visit: Payer: PPO | Admitting: Family Medicine

## 2019-12-13 ENCOUNTER — Encounter: Payer: Self-pay | Admitting: Family Medicine

## 2019-12-13 VITALS — BP 120/82 | HR 61 | Ht 68.0 in | Wt 187.2 lb

## 2019-12-13 DIAGNOSIS — R0602 Shortness of breath: Secondary | ICD-10-CM

## 2019-12-13 DIAGNOSIS — R030 Elevated blood-pressure reading, without diagnosis of hypertension: Secondary | ICD-10-CM | POA: Diagnosis not present

## 2019-12-13 DIAGNOSIS — R0609 Other forms of dyspnea: Secondary | ICD-10-CM

## 2019-12-13 DIAGNOSIS — Z9889 Other specified postprocedural states: Secondary | ICD-10-CM | POA: Diagnosis not present

## 2019-12-13 DIAGNOSIS — R0600 Dyspnea, unspecified: Secondary | ICD-10-CM | POA: Diagnosis not present

## 2019-12-13 NOTE — Patient Instructions (Signed)

## 2019-12-13 NOTE — Addendum Note (Signed)
Addended by: Julian Hy T on: 12/13/2019 11:17 AM   Modules accepted: Orders

## 2020-01-10 ENCOUNTER — Ambulatory Visit (INDEPENDENT_AMBULATORY_CARE_PROVIDER_SITE_OTHER): Payer: PPO

## 2020-01-10 ENCOUNTER — Telehealth: Payer: Self-pay | Admitting: *Deleted

## 2020-01-10 DIAGNOSIS — R0602 Shortness of breath: Secondary | ICD-10-CM | POA: Diagnosis not present

## 2020-01-10 LAB — ECHOCARDIOGRAM COMPLETE
Area-P 1/2: 1.73 cm2
Calc EF: 50.7 %
MV M vel: 1.75 m/s
MV Peak grad: 12.3 mmHg
S' Lateral: 3.1 cm
Single Plane A2C EF: 50.4 %
Single Plane A4C EF: 51 %

## 2020-01-10 NOTE — Telephone Encounter (Signed)
-----   Message from Verta Ellen., NP sent at 01/10/2020 12:19 PM EST ----- Please call the patient and let him know the pumping function of his heart looks good.  His mitral valve replacement looks good.  Let him know there is nothing on the echocardiogram that might explain his shortness of breath.  At last visit he stated he was almost certain he recently had the Covid virus and the shortness of breath would be attributed to that.  He did not seek medical attention for the symptoms he was having.  Ask him if his shortness of breath has improved any.  Otherwise his echocardiogram looks good.  Thank you

## 2020-01-10 NOTE — Telephone Encounter (Signed)
Patient informed. Copy sent to PCP °

## 2020-03-12 DIAGNOSIS — R972 Elevated prostate specific antigen [PSA]: Secondary | ICD-10-CM | POA: Diagnosis not present

## 2020-03-12 DIAGNOSIS — G4733 Obstructive sleep apnea (adult) (pediatric): Secondary | ICD-10-CM | POA: Diagnosis not present

## 2020-03-12 DIAGNOSIS — E785 Hyperlipidemia, unspecified: Secondary | ICD-10-CM | POA: Diagnosis not present

## 2020-03-12 DIAGNOSIS — N183 Chronic kidney disease, stage 3 unspecified: Secondary | ICD-10-CM | POA: Diagnosis not present

## 2020-03-12 DIAGNOSIS — I340 Nonrheumatic mitral (valve) insufficiency: Secondary | ICD-10-CM | POA: Diagnosis not present

## 2020-03-12 DIAGNOSIS — Z0000 Encounter for general adult medical examination without abnormal findings: Secondary | ICD-10-CM | POA: Diagnosis not present

## 2020-03-12 DIAGNOSIS — N289 Disorder of kidney and ureter, unspecified: Secondary | ICD-10-CM | POA: Diagnosis not present

## 2020-03-12 DIAGNOSIS — Z1211 Encounter for screening for malignant neoplasm of colon: Secondary | ICD-10-CM | POA: Diagnosis not present

## 2020-03-21 DIAGNOSIS — Z1211 Encounter for screening for malignant neoplasm of colon: Secondary | ICD-10-CM | POA: Diagnosis not present

## 2020-06-16 ENCOUNTER — Ambulatory Visit: Payer: PPO | Admitting: Cardiology

## 2020-06-16 ENCOUNTER — Encounter: Payer: Self-pay | Admitting: Cardiology

## 2020-06-16 VITALS — BP 110/80 | HR 60 | Ht 68.0 in | Wt 183.0 lb

## 2020-06-16 DIAGNOSIS — Z9889 Other specified postprocedural states: Secondary | ICD-10-CM | POA: Diagnosis not present

## 2020-06-16 NOTE — Progress Notes (Signed)
Clinical Summary Jonathan Church is a 75 y.o.male seen today for follow up of the following medical problems.   1. Mitral regurgitation s/ MV repair - history of severe MR, he is s/p MV repair by Dr Roxy Manns 08/2014.  12/2019 echo LVEF 55%. Normal MV repair without any MR - no recent SOB/DOE. No LE edema.     2. ckd - followed by pcp  SH: works as Development worker, community, has his own company   Past Medical History:  Diagnosis Date  . Asthma    years ago  . Head injury, closed   . Heart murmur   . Hemorrhoids   . Lymphocele after surgical procedure 11/18/2014   Right groin lymphocele  . 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 Severe mitral regurgitation 05/16/2014  . Shortness of breath dyspnea    with exertion  . Sleep apnea    wears CPAP     Allergies  Allergen Reactions  . Peanut-Containing Drug Products Hives and Itching  . Tape Rash    Rash from tape wrapped around arm after giving blood     Current Outpatient Medications  Medication Sig Dispense Refill  . MISC NATURAL PRODUCTS PO Carbon C 60 - 1 teaspoon daily mixed in olive oil    . OVER THE COUNTER MEDICATION Take 80,000-240,000 Units by mouth 3 (three) times a week. Serra peptase (1 capsule 80,000 units)  - protein destroying enzymes for inflammation and cholesterol     No current facility-administered medications for this visit.     Past Surgical History:  Procedure Laterality Date  . CARDIAC CATHETERIZATION N/A 06/18/2014   Procedure: Right/Left Heart Cath and Coronary Angiography;  Surgeon: Jettie Booze, MD;  Location: [facility];  Service: Cardiovascular;  Laterality: N/A;  . COLONOSCOPY    . GROIN DEBRIDEMENT Right 12/23/2014   Procedure: GROIN DEBRIDEMENT with  wound VAC application;  Surgeon: [name], MD;  Location: [facility];  Service: Vascular;  Laterality: Right;  . MITRAL VALVE REPAIR Right 09/18/2014   Procedure: MINIMALLY INVASIVE MITRAL VALVE REPAIR (MVR);  Surgeon: [name], MD;  Location: [place];  Service: Open Heart Surgery;  Laterality: Right;  . Nasal septrum  1970's  . TEE WITHOUT CARDIOVERSION N/A 06/11/2014   Procedure: TRANSESOPHAGEAL ECHOCARDIOGRAM (TEE);  Surgeon: Arnoldo Lenis, MD;  [facility];  Service: Cardiology;  Laterality: N/A;  . TEE WITHOUT CARDIOVERSION N/A 09/18/2014   Procedure: TRANSESOPHAGEAL ECHOCARDIOGRAM (TEE);  Surgeon: [name], MD;  Location: [place];  Service: Open Heart Surgery;  Laterality: N/A;  . TEE WITHOUT CARDIOVERSION N/A 12/25/2014   Procedure: TRANSESOPHAGEAL ECHOCARDIOGRAM (TEE);  Surgeon: Josue Hector, MD;  Location[facility]COPY;  Service: Cardiovascular;  Laterality: N/A;     Allergies  Allergen Reactions  . Peanut-Containing Drug Products Hives and Itching  . Tape Rash    Rash from tape wrapped around arm after giving blood      Family History  Problem Relation Age of Onset  . Heart failure Father   . Heart failure Paternal Grandmother      Social History Jonathan Church reports that he has never smoked. He has never used smokeless tobacco. Jonathan Church reports no history of alcohol use.   Review of Systems CONSTITUTIONAL: No weight  loss, fever, chills, weakness or fatigue.  HEENT: Eyes: No visual loss, blurred vision, double vision or yellow sclerae.No hearing loss, sneezing, congestion, runny nose or sore throat.  SKIN: No rash or itching.  CARDIOVASCULAR: per hpi RESPIRATORY: No shortness of breath, cough or sputum.  GASTROINTESTINAL: No anorexia, nausea, vomiting or diarrhea. No abdominal pain or blood.  GENITOURINARY: No burning on urination, no polyuria NEUROLOGICAL: No headache, dizziness, syncope, paralysis, ataxia, numbness or tingling in the  extremities. No change in bowel or bladder control.  MUSCULOSKELETAL: No muscle, back pain, joint pain or stiffness.  LYMPHATICS: No enlarged nodes. No history of splenectomy.  PSYCHIATRIC: No history of depression or anxiety.  ENDOCRINOLOGIC: No reports of sweating, cold or heat intolerance. No polyuria or polydipsia.  Marland Kitchen   Physical Examination Today's Vitals   06/16/20 1424  BP: 110/80  Pulse: 60  SpO2: 94%  Weight: 183 lb (83 kg)  Height: 5\' 8"  (1.727 m)   Body mass index is 27.83 kg/m.  Gen: resting comfortably, no acute distress HEENT: no scleral icterus, pupils equal round and reactive, no palptable cervical adenopathy,  CV: RRR, no m/r/g, no jvd Resp: Clear to auscultation bilaterally GI: abdomen is soft, non-tender, non-distended, normal bowel sounds, no hepatosplenomegaly MSK: extremities are warm, no edema.  Skin: warm, no rash Neuro:  no focal deficits Psych: appropriate affect   Diagnostic Studies  12/2019 echo IMPRESSIONS    1. Left ventricular ejection fraction, by estimation, is 55%. The left  ventricle has normal function. The left ventricle has no regional wall  motion abnormalities. Left ventricular diastolic parameters were normal.  2. Right ventricular systolic function is normal. The right ventricular  size is normal. There is mildly elevated pulmonary artery systolic  pressure. The estimated right ventricular systolic pressure is 17.4 mmHg.  3. Left atrial size was mildly dilated.  4. The mitral valve has been repaired/replaced. No evidence of mitral  valve regurgitation. There is a 32 mm prosthetic annuloplasty ring present  in the mitral position. Also evidence of chordal repair.  5. The aortic valve is tricuspid. Aortic valve regurgitation is not  visualized.  6. Aortic dilatation noted. There is mild dilatation of the aortic root,  measuring 39 mm.  7. The inferior vena cava is normal in size with greater than 50%  respiratory  variability, suggesting right atrial pressure of 3 mmHg.    Assessment and Plan  1. Mitral regurgitation s/p MV repair - recent echo shows normal repair. No symptoms, continue to monitor      Arnoldo Lenis, M.D

## 2020-06-16 NOTE — Patient Instructions (Addendum)
Medication Instructions:  Continue all current medications.   Labwork: none  Testing/Procedures: none  Follow-Up: 6 months   Any Other Special Instructions Will Be Listed Below (If Applicable).   If you need a refill on your cardiac medications before your next appointment, please call your pharmacy.  

## 2020-12-23 ENCOUNTER — Encounter: Payer: Self-pay | Admitting: Cardiology

## 2020-12-23 ENCOUNTER — Other Ambulatory Visit: Payer: Self-pay

## 2020-12-23 ENCOUNTER — Ambulatory Visit (INDEPENDENT_AMBULATORY_CARE_PROVIDER_SITE_OTHER): Payer: PPO | Admitting: Cardiology

## 2020-12-23 VITALS — BP 126/86 | HR 64 | Ht 68.0 in | Wt 191.4 lb

## 2020-12-23 DIAGNOSIS — Z139 Encounter for screening, unspecified: Secondary | ICD-10-CM

## 2020-12-23 DIAGNOSIS — R0609 Other forms of dyspnea: Secondary | ICD-10-CM

## 2020-12-23 DIAGNOSIS — R5383 Other fatigue: Secondary | ICD-10-CM | POA: Diagnosis not present

## 2020-12-23 DIAGNOSIS — R030 Elevated blood-pressure reading, without diagnosis of hypertension: Secondary | ICD-10-CM

## 2020-12-23 DIAGNOSIS — Z13220 Encounter for screening for lipoid disorders: Secondary | ICD-10-CM | POA: Diagnosis not present

## 2020-12-23 DIAGNOSIS — Z9889 Other specified postprocedural states: Secondary | ICD-10-CM | POA: Diagnosis not present

## 2020-12-23 NOTE — Patient Instructions (Addendum)
Medication Instructions:  Continue all current medications.  Labwork: none  Testing/Procedures: BMET, CBC, TSH, HgA1c, FLP, Mg, PSA - orders given today  Office will contact with results via phone or letter.     Follow-Up:  Your physician wants you to follow up in:  1 year.  You will receive a reminder letter in the mail one-two months in advance.  If you don't receive a letter, please call our office to schedule the follow up appointment    Any Other Special Instructions Will Be Listed Below (If Applicable).   If you need a refill on your cardiac medications before your next appointment, please call your pharmacy.

## 2020-12-23 NOTE — Addendum Note (Signed)
Addended by: Laurine Blazer on: 12/23/2020 01:31 PM   Modules accepted: Orders

## 2020-12-23 NOTE — Progress Notes (Signed)
Clinical Summary [name] is a 75 y.o.male seen today for follow up of the following medical problems.    1. Mitral regurgitation s/ MV repair - history of severe MR, he is s/p MV repair by Dr Roxy Manns 08/2014.    12/2019 echo LVEF 55%. 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/2014   Complex valvuloplasty including triangular resection of flail segment of posterior leaflet, artificial Gore-tex neochord placement x4 and 32 mm Sorin Memo 3D Rechord ring annuloplasty via right mini thoracotomy approach   Severe mitral regurgitation 05/16/2014   Shortness of breath dyspnea    with exertion   Sleep apnea    wears CPAP     Allergies  Allergen Reactions   Peanut-Containing Drug Products Hives and Itching   Tape Rash    Rash from tape wrapped around arm after giving blood     Current Outpatient Medications  Medication Sig Dispense Refill   MISC NATURAL PRODUCTS PO Carbon C 60 - 1 teaspoon daily mixed in olive oil     OVER THE COUNTER MEDICATION Take 80,000-240,000 Units by mouth 3 (three) times a week. Serra peptase (1 capsule 80,000 units)  - protein destroying enzymes for inflammation and cholesterol     No current facility-administered medications for this visit.     Past Surgical History:  Procedure Laterality Date   CARDIAC CATHETERIZATION N/A 06/18/2014   Procedure: Right/Left Heart Cath and Coronary Angiography;  Surgeon: Jettie Booze, MD;  Location: [place] [facility];  Service: Cardiovascular;  Laterality: N/A;   COLONOSCOPY     GROIN DEBRIDEMENT Right 12/23/2014   Procedure: GROIN  DEBRIDEMENT with wound VAC application;  Surgeon: [name], MD;  Location: [place];  Service: Vascular;  Laterality: Right;   MITRAL VALVE REPAIR Right 09/18/2014   Procedure: MINIMALLY INVASIVE MITRAL VALVE REPAIR (MVR);  Surgeon: [name], MD;  Location: [place];  Service: Open Heart Surgery;  Laterality: Right;   Nasal septrum  1970's   TEE WITHOUT CARDIOVERSION N/A 06/11/2014   Procedure: TRANSESOPHAGEAL ECHOCARDIOGRAM (TEE);  Surgeon: Arnoldo Lenis, MD;  [facility];  Service: Cardiology;  Laterality: N/A;   TEE WITHOUT CARDIOVERSION N/A 09/18/2014   Procedure: TRANSESOPHAGEAL ECHOCARDIOGRAM (TEE);  Surgeon: [name], MD;  Location: [place];  Service: Open Heart Surgery;  Laterality: N/A;   TEE WITHOUT CARDIOVERSION N/A 12/25/2014   Procedure: TRANSESOPHAGEAL ECHOCARDIOGRAM (TEE);  Surgeon: Josue Hector, MD;  Location: [facility];  Service: Cardiovascular;  Laterality: N/A;     Allergies  Allergen Reactions   Peanut-Containing Drug Products Hives and Itching   Tape Rash    Rash from tape wrapped around arm after giving blood      Family History  Problem Relation Age of Onset   Heart failure Father    Heart failure Paternal Grandmother      Social History [name] reports that he has never smoked. He has never used smokeless tobacco. [name] reports no history  of alcohol use.   Review of Systems CONSTITUTIONAL: No weight loss, fever, chills, weakness or fatigue.  HEENT: Eyes: No visual loss, blurred vision, double vision or yellow sclerae.No hearing loss, sneezing, congestion, runny nose or sore throat.  SKIN: No rash or itching.  CARDIOVASCULAR: per hpi RESPIRATORY: No shortness of breath, cough or sputum.  GASTROINTESTINAL: No anorexia, nausea, vomiting or diarrhea. No abdominal pain or blood.  GENITOURINARY: No burning on urination, no polyuria NEUROLOGICAL: No headache, dizziness, syncope, paralysis, ataxia, numbness or tingling  in the extremities. No change in bowel or bladder control.  MUSCULOSKELETAL: No muscle, back pain, joint pain or stiffness.  LYMPHATICS: No enlarged nodes. No history of splenectomy.  PSYCHIATRIC: No history of depression or anxiety.  ENDOCRINOLOGIC: No reports of sweating, cold or heat intolerance. No polyuria or polydipsia.  Marland Kitchen   Physical Examination Today's Vitals   12/23/20 1249  BP: 126/86  Pulse: 64  SpO2: 94%  Weight: 191 lb 6.4 oz (86.8 kg)  Height: 5\' 8"  (1.727 m)   Body mass index is 29.1 kg/m.  Gen: resting comfortably, no acute distress HEENT: no scleral icterus, pupils equal round and reactive, no palptable cervical adenopathy,  CV: RRR, no m/r/ gno jvd Resp: Clear to auscultation bilaterally GI: abdomen is soft, non-tender, non-distended, normal bowel sounds, no hepatosplenomegaly MSK: extremities are warm, no edema.  Skin: warm, no rash Neuro:  no focal deficits Psych: appropriate affect   Diagnostic Studies  12/2019 echo IMPRESSIONS     1. Left ventricular ejection fraction, by estimation, is 55%. The left  ventricle has normal function. The left ventricle has no regional wall  motion abnormalities. Left ventricular diastolic parameters were normal.   2. Right ventricular systolic function is normal. The right ventricular  size is normal. There is mildly elevated pulmonary artery systolic  pressure. The estimated right ventricular systolic pressure is 97.9 mmHg.   3. Left atrial size was mildly dilated.   4. The mitral valve has been repaired/replaced. No evidence of mitral  valve regurgitation. There is a 32 mm prosthetic annuloplasty ring present  in the mitral position. Also evidence of chordal repair.   5. The aortic valve is tricuspid. Aortic valve regurgitation is not  visualized.   6. Aortic dilatation noted. There is mild dilatation of the aortic root,  measuring 39 mm.   7. The inferior vena cava is normal in size with greater than 50%   respiratory variability, suggesting right atrial pressure of 3 mmHg.    Assessment and Plan  1. 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

## 2021-01-02 DIAGNOSIS — Z13220 Encounter for screening for lipoid disorders: Secondary | ICD-10-CM | POA: Diagnosis not present

## 2021-01-02 DIAGNOSIS — R5383 Other fatigue: Secondary | ICD-10-CM | POA: Diagnosis not present

## 2021-01-02 DIAGNOSIS — R030 Elevated blood-pressure reading, without diagnosis of hypertension: Secondary | ICD-10-CM | POA: Diagnosis not present

## 2021-01-02 DIAGNOSIS — R0609 Other forms of dyspnea: Secondary | ICD-10-CM | POA: Diagnosis not present

## 2021-01-02 DIAGNOSIS — Z139 Encounter for screening, unspecified: Secondary | ICD-10-CM | POA: Diagnosis not present

## 2021-01-03 LAB — BASIC METABOLIC PANEL
BUN/Creatinine Ratio: 10 (ref 10–24)
BUN: 18 mg/dL (ref 8–27)
CO2: 29 mmol/L (ref 20–29)
Calcium: 9 mg/dL (ref 8.6–10.2)
Chloride: 104 mmol/L (ref 96–106)
Creatinine, Ser: 1.73 mg/dL — ABNORMAL HIGH (ref 0.76–1.27)
Glucose: 86 mg/dL (ref 70–99)
Potassium: 4.9 mmol/L (ref 3.5–5.2)
Sodium: 143 mmol/L (ref 134–144)
eGFR: 41 mL/min/{1.73_m2} — ABNORMAL LOW (ref >59)

## 2021-01-03 LAB — HEMOGLOBIN A1C
Est. average glucose Bld gHb Est-mCnc: 114 mg/dL
Hgb A1c MFr Bld: 5.6 % (ref 4.8–5.6)

## 2021-01-03 LAB — MAGNESIUM: Magnesium: 2.2 mg/dL (ref 1.6–2.3)

## 2021-01-03 LAB — CBC
Hematocrit: 43 % (ref 37.5–51.0)
Hemoglobin: 14.6 g/dL (ref 13.0–17.7)
MCH: 32.2 pg (ref 26.6–33.0)
MCHC: 34 g/dL (ref 31.5–35.7)
MCV: 95 fL (ref 79–97)
Platelets: 253 10*3/uL (ref 150–450)
RBC: 4.54 x10E6/uL (ref 4.14–5.80)
RDW: 11.8 % (ref 11.6–15.4)
WBC: 6.6 10*3/uL (ref 3.4–10.8)

## 2021-01-03 LAB — TSH: TSH: 2.33 u[IU]/mL (ref 0.450–4.500)

## 2021-01-03 LAB — LIPID PANEL
Chol/HDL Ratio: 4 ratio (ref 0.0–5.0)
Cholesterol, Total: 175 mg/dL (ref 100–199)
HDL: 44 mg/dL (ref >39)
LDL Chol Calc (NIH): 115 mg/dL — ABNORMAL HIGH (ref 0–99)
Triglycerides: 87 mg/dL (ref 0–149)
VLDL Cholesterol Cal: 16 mg/dL (ref 5–40)

## 2021-01-03 LAB — PSA: Prostate Specific Ag, Serum: 4 ng/mL (ref 0.0–4.0)

## 2021-01-06 ENCOUNTER — Telehealth: Payer: Self-pay | Admitting: *Deleted

## 2021-01-06 NOTE — Telephone Encounter (Signed)
-----   Message from Arnoldo Lenis, MD sent at 01/06/2021  2:35 PM EST ----- Labs look fine. REnal function remains decreased but overall stable.  Zandra Abts MD

## 2021-01-06 NOTE — Telephone Encounter (Signed)
Laurine Blazer, LPN  44/03/4740  5:95 PM EST Back to Top    Notified, copy to pcp.

## 2021-03-19 DIAGNOSIS — R972 Elevated prostate specific antigen [PSA]: Secondary | ICD-10-CM | POA: Diagnosis not present

## 2021-03-19 DIAGNOSIS — Z1211 Encounter for screening for malignant neoplasm of colon: Secondary | ICD-10-CM | POA: Diagnosis not present

## 2021-03-19 DIAGNOSIS — G4733 Obstructive sleep apnea (adult) (pediatric): Secondary | ICD-10-CM | POA: Diagnosis not present

## 2021-03-19 DIAGNOSIS — Z0000 Encounter for general adult medical examination without abnormal findings: Secondary | ICD-10-CM | POA: Diagnosis not present

## 2021-03-19 DIAGNOSIS — E785 Hyperlipidemia, unspecified: Secondary | ICD-10-CM | POA: Diagnosis not present

## 2021-03-19 DIAGNOSIS — I340 Nonrheumatic mitral (valve) insufficiency: Secondary | ICD-10-CM | POA: Diagnosis not present

## 2021-03-19 DIAGNOSIS — N183 Chronic kidney disease, stage 3 unspecified: Secondary | ICD-10-CM | POA: Diagnosis not present

## 2021-04-14 DIAGNOSIS — R972 Elevated prostate specific antigen [PSA]: Secondary | ICD-10-CM | POA: Diagnosis not present

## 2021-04-14 DIAGNOSIS — N289 Disorder of kidney and ureter, unspecified: Secondary | ICD-10-CM | POA: Diagnosis not present

## 2021-04-17 DIAGNOSIS — Z1211 Encounter for screening for malignant neoplasm of colon: Secondary | ICD-10-CM | POA: Diagnosis not present

## 2021-06-19 ENCOUNTER — Ambulatory Visit: Payer: PPO | Admitting: Cardiology

## 2021-07-02 ENCOUNTER — Ambulatory Visit: Payer: PPO | Admitting: Cardiology

## 2021-07-02 ENCOUNTER — Encounter: Payer: Self-pay | Admitting: Cardiology

## 2021-07-02 VITALS — BP 129/75 | HR 68 | Ht 68.0 in | Wt 193.2 lb

## 2021-07-02 DIAGNOSIS — R5383 Other fatigue: Secondary | ICD-10-CM

## 2021-07-02 DIAGNOSIS — R0602 Shortness of breath: Secondary | ICD-10-CM

## 2021-07-02 DIAGNOSIS — Z9889 Other specified postprocedural states: Secondary | ICD-10-CM | POA: Diagnosis not present

## 2021-07-02 NOTE — Progress Notes (Signed)
Clinical Summary Jonathan Church is a 76 y.o.male seen today for follow up of the following medical problems.    1. Mitral regurgitation s/ MV repair - history of severe MR, he is s/p MV repair by Dr Roxy Manns 08/2014.    12/2019 echo LVEF 55%. Normal MV repair without any MR       - reports recent DOE with moderate to high levels of activity - no recent edema. - no chest pains - no coughing/wheezing.      2. Fatigue/Low energy - +daytime somnolence.  - to bed at 11pm, gets up 7AM.  - diagnosed with sleep apnea, had tried cpap   3. CKD   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/2014   Complex valvuloplasty including triangular resection of flail segment of posterior leaflet, artificial Gore-tex neochord placement x4 and 32 mm Sorin Memo 3D Rechord ring annuloplasty via right mini thoracotomy approach   Severe mitral regurgitation 05/16/2014   Shortness of breath dyspnea    with exertion   Sleep apnea    wears CPAP     Allergies  Allergen Reactions   Peanut-Containing Drug Products Hives and Itching   Tape Rash    Rash from tape wrapped around arm after giving blood     Current Outpatient Medications  Medication Sig Dispense Refill   MISC NATURAL PRODUCTS PO Carbon C 60 - 1 teaspoon daily mixed in olive oil     OVER THE COUNTER MEDICATION Take 80,000-240,000 Units by mouth 3 (three) times a week. Serra peptase (1 capsule 80,000 units)  - protein destroying enzymes for inflammation and cholesterol     No current facility-administered medications for this visit.     Past Surgical History:  Procedure Laterality Date   CARDIAC CATHETERIZATION N/A 06/18/2014   Procedure: Right/Left Heart Cath and Coronary Angiography;  Surgeon: Jettie Booze, MD;  Location: [place] [facility];  Service: Cardiovascular;  Laterality: N/A;   COLONOSCOPY     GROIN DEBRIDEMENT Right 12/23/2014   Procedure: GROIN DEBRIDEMENT with wound VAC application;  Surgeon: [name], MD;  Location: [place];  Service: Vascular;  Laterality: Right;   MITRAL VALVE REPAIR Right 09/18/2014   Procedure: MINIMALLY INVASIVE MITRAL VALVE REPAIR (MVR);  Surgeon: [name], MD;  Location: [place];  Service: Open Heart Surgery;  Laterality: Right;   Nasal septrum  1970's   TEE WITHOUT CARDIOVERSION N/A 06/11/2014   Procedure: TRANSESOPHAGEAL ECHOCARDIOGRAM (TEE);  Surgeon: Arnoldo Lenis, MD;  [facility];  Service: Cardiology;  Laterality: N/A;   TEE WITHOUT CARDIOVERSION N/A 09/18/2014   Procedure: TRANSESOPHAGEAL ECHOCARDIOGRAM (TEE);  Surgeon: [name], MD;  Location: [place];  Service: Open Heart Surgery;  Laterality: N/A;   TEE WITHOUT CARDIOVERSION N/A 12/25/2014   Procedure: TRANSESOPHAGEAL ECHOCARDIOGRAM (TEE);  Surgeon: Josue Hector, MD;  Location: [facility];  Service: Cardiovascular;  Laterality: N/A;     Allergies  Allergen Reactions   Peanut-Containing Drug Products Hives and Itching   Tape Rash    Rash from tape wrapped around arm after giving blood      Family History  Problem Relation Age of Onset   Heart failure Father    Heart failure Paternal  Grandmother      Social History Jonathan Church reports that he has never smoked. He has never used smokeless tobacco. Jonathan Church reports no history of alcohol use.   Review of Systems CONSTITUTIONAL: No weight loss, fever, chills, weakness or fatigue.  HEENT: Eyes: No visual loss, blurred vision, double vision or yellow sclerae.No hearing loss, sneezing, congestion, runny nose or sore throat.  SKIN: No rash or itching.  CARDIOVASCULAR: per hpi RESPIRATORY: per hpi GASTROINTESTINAL: No anorexia, nausea, vomiting or diarrhea. No abdominal pain or blood.  GENITOURINARY: No burning on  urination, no polyuria NEUROLOGICAL: No headache, dizziness, syncope, paralysis, ataxia, numbness or tingling in the extremities. No change in bowel or bladder control.  MUSCULOSKELETAL: No muscle, back pain, joint pain or stiffness.  LYMPHATICS: No enlarged nodes. No history of splenectomy.  PSYCHIATRIC: No history of depression or anxiety.  ENDOCRINOLOGIC: No reports of sweating, cold or heat intolerance. No polyuria or polydipsia.  Marland Kitchen   Physical Examination Today's Vitals   07/02/21 1330  BP: 129/75  Pulse: 68  SpO2: 94%  Weight: 193 lb 3.2 oz (87.6 kg)  Height: '5\' 8"'$  (1.727 m)   Body mass index is 29.38 kg/m.  Gen: resting comfortably, no acute distress HEENT: no scleral icterus, pupils equal round and reactive, no palptable cervical adenopathy,  CV: RRR, no m/r/g no jvd Resp: Clear to auscultation bilaterally GI: abdomen is soft, non-tender, non-distended, normal bowel sounds, no hepatosplenomegaly MSK: extremities are warm, no edema.  Skin: warm, no rash Neuro:  no focal deficits Psych: appropriate affect   Diagnostic Studies 12/2019 echo IMPRESSIONS     1. Left ventricular ejection fraction, by estimation, is 55%. The left  ventricle has normal function. The left ventricle has no regional wall  motion abnormalities. Left ventricular diastolic parameters were normal.   2. Right ventricular systolic function is normal. The right ventricular  size is normal. There is mildly elevated pulmonary artery systolic  pressure. The estimated right ventricular systolic pressure is 19.3 mmHg.   3. Left atrial size was mildly dilated.   4. The mitral valve has been repaired/replaced. No evidence of mitral  valve regurgitation. There is a 32 mm prosthetic annuloplasty ring present  in the mitral position. Also evidence of chordal repair.   5. The aortic valve is tricuspid. Aortic valve regurgitation is not  visualized.   6. Aortic dilatation noted. There is mild dilatation of  the aortic root,  measuring 39 mm.   7. The inferior vena cava is normal in size with greater than 50%  respiratory variability, suggesting right atrial pressure of 3 mmHg.     Assessment and Plan   1. Mitral regurgitation s/p MV repair - some recent DOE wit moderate to high levels of exertion that is new - will update echo   2.Fatigue/Hypersomnolence - likely from his OSA, not using cpap due to discomfort - discussed possible referal to pulmonary , perhaps could be a candidate for inspire. He will think over and decide and let us know.      Arnoldo Lenis, M.D.,

## 2021-07-02 NOTE — Patient Instructions (Addendum)

## 2021-07-20 ENCOUNTER — Ambulatory Visit (INDEPENDENT_AMBULATORY_CARE_PROVIDER_SITE_OTHER): Payer: PPO

## 2021-07-20 DIAGNOSIS — R0602 Shortness of breath: Secondary | ICD-10-CM | POA: Diagnosis not present

## 2021-07-20 LAB — ECHOCARDIOGRAM COMPLETE
Area-P 1/2: 2.01 cm2
Calc EF: 59.9 %
MV M vel: 2.47 m/s
MV Peak grad: 24.4 mmHg
S' Lateral: 2.69 cm
Single Plane A2C EF: 61.5 %
Single Plane A4C EF: 59.2 %

## 2021-07-22 ENCOUNTER — Telehealth: Payer: Self-pay | Admitting: Cardiology

## 2021-07-22 NOTE — Telephone Encounter (Signed)
Patient calling for echo results 

## 2021-07-23 NOTE — Telephone Encounter (Signed)
° °  Pt is calling back to f/u °

## 2021-07-23 NOTE — Telephone Encounter (Signed)
Laurine Blazer, LPN  4/97/5300  5:11 PM EDT Back to Top    Notified, copy to pcp.    Arnoldo Lenis, MD  07/23/2021  2:06 PM EDT     Echo shows normal heart pumping function. Normal MV valve, the prior repaired valve continues to work normally. No worrisome findings   Zandra Abts MD

## 2021-08-25 ENCOUNTER — Ambulatory Visit: Payer: PPO | Admitting: Cardiology

## 2022-01-04 ENCOUNTER — Other Ambulatory Visit (HOSPITAL_COMMUNITY)
Admission: RE | Admit: 2022-01-04 | Discharge: 2022-01-04 | Disposition: A | Discharge status: Home / Self Care | Payer: PPO | Source: Ambulatory Visit | Attending: Cardiology | Admitting: Cardiology

## 2022-01-04 ENCOUNTER — Ambulatory Visit: Payer: PPO | Attending: Cardiology | Admitting: Cardiology

## 2022-01-04 ENCOUNTER — Encounter: Payer: Self-pay | Admitting: Cardiology

## 2022-01-04 VITALS — BP 160/80 | HR 64 | Ht 68.0 in | Wt 192.0 lb

## 2022-01-04 DIAGNOSIS — Z131 Encounter for screening for diabetes mellitus: Secondary | ICD-10-CM

## 2022-01-04 DIAGNOSIS — Z13220 Encounter for screening for lipoid disorders: Secondary | ICD-10-CM

## 2022-01-04 DIAGNOSIS — Z9889 Other specified postprocedural states: Secondary | ICD-10-CM | POA: Insufficient documentation

## 2022-01-04 DIAGNOSIS — R7303 Prediabetes: Secondary | ICD-10-CM | POA: Diagnosis not present

## 2022-01-04 DIAGNOSIS — R030 Elevated blood-pressure reading, without diagnosis of hypertension: Secondary | ICD-10-CM | POA: Insufficient documentation

## 2022-01-04 LAB — COMPREHENSIVE METABOLIC PANEL
ALT: 14 U/L (ref 0–44)
AST: 21 U/L (ref 15–41)
Albumin: 3.6 g/dL (ref 3.5–5.0)
Alkaline Phosphatase: 63 U/L (ref 38–126)
Anion gap: 6 (ref 5–15)
BUN: 20 mg/dL (ref 8–23)
CO2: 28 mmol/L (ref 22–32)
Calcium: 8.6 mg/dL — ABNORMAL LOW (ref 8.9–10.3)
Chloride: 106 mmol/L (ref 98–111)
Creatinine, Ser: 1.55 mg/dL — ABNORMAL HIGH (ref 0.61–1.24)
GFR, Estimated: 46 mL/min — ABNORMAL LOW (ref >60)
Glucose, Bld: 85 mg/dL (ref 70–99)
Potassium: 3.8 mmol/L (ref 3.5–5.1)
Sodium: 140 mmol/L (ref 135–145)
Total Bilirubin: 1.1 mg/dL (ref 0.3–1.2)
Total Protein: 7.2 g/dL (ref 6.5–8.1)

## 2022-01-04 LAB — LIPID PANEL
Cholesterol: 170 mg/dL (ref 0–200)
HDL: 42 mg/dL (ref >40)
LDL Cholesterol: 110 mg/dL — ABNORMAL HIGH (ref 0–99)
Total CHOL/HDL Ratio: 4 RATIO
Triglycerides: 88 mg/dL (ref <150)
VLDL: 18 mg/dL (ref 0–40)

## 2022-01-04 LAB — CBC
HCT: 41.8 % (ref 39.0–52.0)
Hemoglobin: 13.8 g/dL (ref 13.0–17.0)
MCH: 32.3 pg (ref 26.0–34.0)
MCHC: 33 g/dL (ref 30.0–36.0)
MCV: 97.9 fL (ref 80.0–100.0)
Platelets: 249 10*3/uL (ref 150–400)
RBC: 4.27 MIL/uL (ref 4.22–5.81)
RDW: 12.1 % (ref 11.5–15.5)
WBC: 6.4 10*3/uL (ref 4.0–10.5)
nRBC: 0 % (ref 0.0–0.2)

## 2022-01-04 LAB — MAGNESIUM: Magnesium: 2 mg/dL (ref 1.7–2.4)

## 2022-01-04 LAB — TSH: TSH: 3.022 u[IU]/mL (ref 0.350–4.500)

## 2022-01-04 NOTE — Progress Notes (Signed)
Clinical Summary [name] is a 76 y.o.male seen today for follow up of the following medical problems.    1. Mitral regurgitation s/ MV repair - history of severe MR, he is s/p MV repair by Dr Jonathan Church 08/2014.    12/2019 echo LVEF 55%. Normal MV repair without any MR         06/2021 echo: LVEF 55-60%, no WMAs, trivial MR, mild to mod TR - chronic stable symptoms unchanged.     2. Fatigue/Low energy - +daytime somnolence.  - to bed at 11pm, gets up 7AM.  - diagnosed with sleep apnea, had tried cpap     3. CKD III -Cr 1.57  4. Elevated bp - home bp's 120s/70s    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/2014   Complex valvuloplasty including triangular resection of flail segment of posterior leaflet, artificial Gore-tex neochord placement x4 and 32 mm Sorin Memo 3D Rechord ring annuloplasty via right mini thoracotomy approach   Severe mitral regurgitation 05/16/2014   Shortness of breath dyspnea    with exertion   Sleep apnea    wears CPAP     Allergies  Allergen Reactions   Peanut-Containing Drug Products Hives and Itching   Tape Rash    Rash from tape wrapped around arm after giving blood     Current Outpatient Medications  Medication Sig Dispense Refill   MISC NATURAL PRODUCTS PO Carbon C 60 - 1 teaspoon daily mixed in olive oil (Patient not taking: Reported on 07/02/2021)     OVER THE COUNTER MEDICATION Take 80,000-240,000 Units by mouth 3 (three) times a week. Serra peptase (1 capsule 80,000 units)  - protein destroying enzymes for inflammation and cholesterol (Patient not taking: Reported on 07/02/2021)     No current facility-administered medications for this visit.     Past Surgical History:  Procedure Laterality Date   CARDIAC CATHETERIZATION N/A  06/18/2014   Procedure: Right/Left Heart Cath and Coronary Angiography;  Surgeon: Jonathan Booze, MD;  Location: [place] [facility];  Service: Cardiovascular;  Laterality: N/A;   COLONOSCOPY     GROIN DEBRIDEMENT Right 12/23/2014   Procedure: GROIN DEBRIDEMENT with wound VAC application;  Surgeon: Jonathan Alberts, MD;  Location: [place];  Service: Vascular;  Laterality: Right;   MITRAL VALVE REPAIR Right 09/18/2014   Procedure: MINIMALLY INVASIVE MITRAL VALVE REPAIR (MVR);  Surgeon: Jonathan Alberts, MD;  Location: [place];  Service: Open Heart Surgery;  Laterality: Right;   Nasal septrum  1970's   TEE WITHOUT CARDIOVERSION N/A 06/11/2014   Procedure: TRANSESOPHAGEAL ECHOCARDIOGRAM (TEE);  Surgeon: Jonathan Lenis, MD;  [facility];  Service: Cardiology;  Laterality: N/A;   TEE WITHOUT CARDIOVERSION N/A 09/18/2014   Procedure: TRANSESOPHAGEAL ECHOCARDIOGRAM (TEE);  Surgeon: Jonathan Alberts, MD;  Location: [place];  Service: Open Heart Surgery;  Laterality: N/A;   TEE WITHOUT CARDIOVERSION N/A 12/25/2014   Procedure: TRANSESOPHAGEAL ECHOCARDIOGRAM (TEE);  Surgeon: Jonathan Hector, MD;  Location: [facility];  Service: Cardiovascular;  Laterality: N/A;     Allergies  Allergen Reactions   Peanut-Containing Drug Products Hives and Itching   Tape Rash    Rash from tape wrapped around arm after giving blood  Family History  Problem Relation Age of Onset   Heart failure Father    Heart failure Paternal Grandmother      Social History [name] reports that he has never smoked. He has never used smokeless tobacco. [name] reports no history of alcohol use.   Review of Systems CONSTITUTIONAL: No weight loss, fever, chills, weakness or fatigue.  HEENT: Eyes: No visual loss, blurred vision, double vision or yellow sclerae.No hearing loss, sneezing, congestion, runny nose or sore throat.  SKIN: No rash or itching.  CARDIOVASCULAR: pe rhpi RESPIRATORY: No shortness of  breath, cough or sputum.  GASTROINTESTINAL: No anorexia, nausea, vomiting or diarrhea. No abdominal pain or blood.  GENITOURINARY: No burning on urination, no polyuria NEUROLOGICAL: No headache, dizziness, syncope, paralysis, ataxia, numbness or tingling in the extremities. No change in bowel or bladder control.  MUSCULOSKELETAL: No muscle, back pain, joint pain or stiffness.  LYMPHATICS: No enlarged nodes. No history of splenectomy.  PSYCHIATRIC: No history of depression or anxiety.  ENDOCRINOLOGIC: No reports of sweating, cold or heat intolerance. No polyuria or polydipsia.  Marland Kitchen   Physical Examination Today's Vitals   01/04/22 1439  BP: (!) 140/82  Pulse: 64  SpO2: 98%  Weight: 192 lb (87.1 kg)  Height: '5\' 8"'$  (1.727 m)   Body mass index is 29.19 kg/m.  Gen: resting comfortably, no acute distress HEENT: no scleral icterus, pupils equal round and reactive, no palptable cervical adenopathy,  CV: RRR, no m/r/g no jvd Resp: Clear to auscultation bilaterally GI: abdomen is soft, non-tender, non-distended, normal bowel sounds, no hepatosplenomegaly MSK: extremities are warm, no edema.  Skin: warm, no rash Neuro:  no focal deficits Psych: appropriate affect   Diagnostic Studies  12/2019 echo IMPRESSIONS     1. Left ventricular ejection fraction, by estimation, is 55%. The left  ventricle has normal function. The left ventricle has no regional wall  motion abnormalities. Left ventricular diastolic parameters were normal.   2. Right ventricular systolic function is normal. The right ventricular  size is normal. There is mildly elevated pulmonary artery systolic  pressure. The estimated right ventricular systolic pressure is 56.3 mmHg.   3. Left atrial size was mildly dilated.   4. The mitral valve has been repaired/replaced. No evidence of mitral  valve regurgitation. There is a 32 mm prosthetic annuloplasty ring present  in the mitral position. Also evidence of chordal repair.    5. The aortic valve is tricuspid. Aortic valve regurgitation is not  visualized.   6. Aortic dilatation noted. There is mild dilatation of the aortic root,  measuring 39 mm.   7. The inferior vena cava is normal in size with greater than 50%  respiratory variability, suggesting right atrial pressure of 3 mmHg.    06/2021 echo IMPRESSIONS     1. Left ventricular ejection fraction, by estimation, is 55 to 60%. The  left ventricle has normal function. The left ventricle has no regional  wall motion abnormalities. Left ventricular diastolic parameters were  normal.   2. Right ventricular systolic function is normal. The right ventricular  size is mildly enlarged. There is moderately elevated pulmonary artery  systolic pressure. The estimated right ventricular systolic pressure is  89.3 mmHg.   3. The mitral valve has been repaired/replaced. Trivial mitral valve  regurgitation. There is a 32 mm prosthetic annuloplasty ring present in  the mitral position.   4. Tricuspid valve regurgitation is mild to moderate.   5. The aortic valve is tricuspid. Aortic valve  regurgitation is not  visualized.   6. The inferior vena cava is normal in size with greater than 50%  respiratory variability, suggesting right atrial pressure of 3 mmHg.   Comparison(s): Prior images reviewed side by side. LVEF normal range at  55-60%. Mitral annuloplasty intact. Moderately elevated RVSP.     Assessment and Plan  1. Mitral regurgitation s/p MV repair - some recent DOE wit moderate to high levels of exertion - benign recent echo, monitor symptosm at this time.     2.Elevated blood pressure - home bp's are at goal, elevated here in clinic. Appears to be white coat HTN, monitor at this time  EKG SR, RBBB/LAFB   F/u 6 months        Jonathan Church, M.D.

## 2022-01-04 NOTE — Patient Instructions (Signed)
Medication Instructions:  Your physician recommends that you continue on your current medications as directed. Please refer to the Current Medication list given to you today.   Labwork: CMET CBC TSH A1C MAG Lipid  Testing/Procedures: None  Follow-Up: Follow up with Dr. Harl Bowie in 6 months.   Any Other Special Instructions Will Be Listed Below (If Applicable).     If you need a refill on your cardiac medications before your next appointment, please call your pharmacy.

## 2022-01-05 LAB — HEMOGLOBIN A1C
Hgb A1c MFr Bld: 5.7 % — ABNORMAL HIGH (ref 4.8–5.6)
Mean Plasma Glucose: 117 mg/dL

## 2022-01-07 ENCOUNTER — Telehealth: Payer: Self-pay

## 2022-01-07 NOTE — Telephone Encounter (Signed)
-----   Message from Arnoldo Lenis, MD sent at 01/06/2022 12:40 PM EST ----- Labs shows very early mild prediabetes, essentially just needs to work on a little weight loss and this should self correct. Kidney function remains decreased but improving from last check. Other labs are fine  Zandra Abts MD

## 2022-01-07 NOTE — Telephone Encounter (Signed)
Patient notified and verbalized understanding. Patient had no questions or concerns at this time. PCP copied 

## 2022-07-20 ENCOUNTER — Ambulatory Visit: Payer: PPO | Admitting: Cardiology

## 2022-07-21 DIAGNOSIS — S40862A Insect bite (nonvenomous) of left upper arm, initial encounter: Secondary | ICD-10-CM | POA: Diagnosis not present

## 2022-07-21 DIAGNOSIS — W57XXXA Bitten or stung by nonvenomous insect and other nonvenomous arthropods, initial encounter: Secondary | ICD-10-CM | POA: Diagnosis not present

## 2022-08-11 ENCOUNTER — Encounter: Payer: Self-pay | Admitting: Cardiology

## 2022-08-11 ENCOUNTER — Ambulatory Visit: Payer: PPO | Attending: Cardiology | Admitting: Cardiology

## 2022-08-11 VITALS — BP 124/70 | HR 72 | Ht 67.0 in | Wt 182.8 lb

## 2022-08-11 DIAGNOSIS — Z9889 Other specified postprocedural states: Secondary | ICD-10-CM

## 2022-08-11 NOTE — Progress Notes (Signed)
Clinical Summary Jonathan Church is a 77 y.o.male seen today for follow up of the following medical problems.    1. Mitral regurgitation s/p MV repair - history of severe MR, he is s/p MV repair by Dr Cornelius Moras 08/2014.    12/2019 echo LVEF 55%. Normal MV repair without any MR       -06/2021 echo: LVEF 55-60%, no WMAs, trivial MR, mild to mod TR - some chronic SOB/DOE with very high levels of activity.    2. Fatigue/Low energy - +daytime somnolence.  - to bed at 11pm, gets up 7AM.  - diagnosed with sleep apnea, had tried cpap     3. CKD III Followed by pcp 07/2022 Cr 1.75     4. Elevated bp - home bp's 120s/70s      07/2022 Cr 1.75 BUN 18 K 4.9  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/2014   Complex valvuloplasty including triangular resection of flail segment of posterior leaflet, artificial Gore-tex neochord placement x4 and 32 mm Sorin Memo 3D Rechord ring annuloplasty via right mini thoracotomy approach   Severe mitral regurgitation 05/16/2014   Shortness of breath dyspnea    with exertion   Sleep apnea    wears CPAP     Allergies  Allergen Reactions   Peanut-Containing Drug Products Hives and Itching   Tape Rash    Rash from tape wrapped around arm after giving blood     Current Outpatient Medications  Medication Sig Dispense Refill   MISC NATURAL PRODUCTS PO Carbon C 60 - 1 teaspoon daily mixed in olive oil (Patient not taking: Reported on 07/02/2021)     OVER THE COUNTER MEDICATION Take 80,000-240,000 Units by mouth 3 (three) times a week. Serra peptase (1 capsule 80,000 units)  - protein destroying enzymes for inflammation and cholesterol (Patient not taking: Reported on 07/02/2021)     No current facility-administered medications for this visit.     Past  Surgical History:  Procedure Laterality Date   CARDIAC CATHETERIZATION N/A 06/18/2014   Procedure: Right/Left Heart Cath and Coronary Angiography;  Surgeon: Corky Crafts, MD;  Location: [facility] INVASIVE CV LAB;  Service: Cardiovascular;  Laterality: N/A;   COLONOSCOPY     GROIN DEBRIDEMENT Right 12/23/2014   Procedure: GROIN DEBRIDEMENT with wound VAC application;  Surgeon: Purcell Nails, MD;  Location: [facility] OR;  Service: Vascular;  Laterality: Right;   MITRAL VALVE REPAIR Right 09/18/2014   Procedure: MINIMALLY INVASIVE MITRAL VALVE REPAIR (MVR);  Surgeon: Purcell Nails, MD;  Location: [facility] OR;  Service: Open Heart Surgery;  Laterality: Right;   Nasal septrum  1970's   TEE WITHOUT CARDIOVERSION N/A 06/11/2014   Procedure: TRANSESOPHAGEAL ECHOCARDIOGRAM (TEE);  Surgeon: Antoine Poche, MD;  Location: [facility] ENDO SUITE;  Service: Cardiology;  Laterality: N/A;   TEE WITHOUT CARDIOVERSION N/A 09/18/2014   Procedure: TRANSESOPHAGEAL ECHOCARDIOGRAM (TEE);  Surgeon: Purcell Nails, MD;  Location: [facility] OR;  Service: Open Heart Surgery;  Laterality: N/A;   TEE WITHOUT CARDIOVERSION N/A 12/25/2014   Procedure: TRANSESOPHAGEAL ECHOCARDIOGRAM (TEE);  Surgeon: Wendall Stade, MD;  Location: [facility] ENDOSCOPY;  Service: Cardiovascular;  Laterality: N/A;     Allergies  Allergen Reactions   Peanut-Containing Drug Products Hives  and Itching   Tape Rash    Rash from tape wrapped around arm after giving blood      Family History  Problem Relation Age of Onset   Heart failure Father    Heart failure Paternal Grandmother      Social History Mr. Debow reports that he has never smoked. He has never used smokeless tobacco. Mr. Prosser reports no history of alcohol use.   Review of Systems CONSTITUTIONAL: No weight loss, fever, chills, weakness or fatigue.  HEENT: Eyes: No visual loss, blurred vision, double vision or yellow sclerae.No hearing loss, sneezing, congestion, runny nose or sore throat.  SKIN:  No rash or itching.  CARDIOVASCULAR: per hpi RESPIRATORY: No shortness of breath, cough or sputum.  GASTROINTESTINAL: No anorexia, nausea, vomiting or diarrhea. No abdominal pain or blood.  GENITOURINARY: No burning on urination, no polyuria NEUROLOGICAL: No headache, dizziness, syncope, paralysis, ataxia, numbness or tingling in the extremities. No change in bowel or bladder control.  MUSCULOSKELETAL: No muscle, back pain, joint pain or stiffness.  LYMPHATICS: No enlarged nodes. No history of splenectomy.  PSYCHIATRIC: No history of depression or anxiety.  ENDOCRINOLOGIC: No reports of sweating, cold or heat intolerance. No polyuria or polydipsia.  Marland Kitchen   Physical Examination Today's Vitals   08/11/22 1419  BP: 124/70  Pulse: 72  SpO2: 95%  Weight: 182 lb 12.8 oz (82.9 kg)  Height: 5\' 7"  (1.702 m)   Body mass index is 28.63 kg/m.  Gen: resting comfortably, no acute distress HEENT: no scleral icterus, pupils equal round and reactive, no palptable cervical adenopathy,  CV: RRR, no mrg, no jvd Resp: Clear to auscultation bilaterally GI: abdomen is soft, non-tender, non-distended, normal bowel sounds, no hepatosplenomegaly MSK: extremities are warm, no edema.  Skin: warm, no rash Neuro:  no focal deficits Psych: appropriate affect   Diagnostic Studies  12/2019 echo IMPRESSIONS     1. Left ventricular ejection fraction, by estimation, is 55%. The left  ventricle has normal function. The left ventricle has no regional wall  motion abnormalities. Left ventricular diastolic parameters were normal.   2. Right ventricular systolic function is normal. The right ventricular  size is normal. There is mildly elevated pulmonary artery systolic  pressure. The estimated right ventricular systolic pressure is 36.2 mmHg.   3. Left atrial size was mildly dilated.   4. The mitral valve has been repaired/replaced. No evidence of mitral  valve regurgitation. There is a 32 mm prosthetic  annuloplasty ring present  in the mitral position. Also evidence of chordal repair.   5. The aortic valve is tricuspid. Aortic valve regurgitation is not  visualized.   6. Aortic dilatation noted. There is mild dilatation of the aortic root,  measuring 39 mm.   7. The inferior vena cava is normal in size with greater than 50%  respiratory variability, suggesting right atrial pressure of 3 mmHg.      06/2021 echo IMPRESSIONS     1. Left ventricular ejection fraction, by estimation, is 55 to 60%. The  left ventricle has normal function. The left ventricle has no regional  wall motion abnormalities. Left ventricular diastolic parameters were  normal.   2. Right ventricular systolic function is normal. The right ventricular  size is mildly enlarged. There is moderately elevated pulmonary artery  systolic pressure. The estimated right ventricular systolic pressure is  45.5 mmHg.   3. The mitral valve has been repaired/replaced. Trivial mitral valve  regurgitation. There is a 32 mm prosthetic annuloplasty ring  present in  the mitral position.   4. Tricuspid valve regurgitation is mild to moderate.   5. The aortic valve is tricuspid. Aortic valve regurgitation is not  visualized.   6. The inferior vena cava is normal in size with greater than 50%  respiratory variability, suggesting right atrial pressure of 3 mmHg.   Comparison(s): Prior images reviewed side by side. LVEF normal range at  55-60%. Mitral annuloplasty intact. Moderately elevated RVSP.          Assessment and Plan   1. Mitral regurgitation s/p MV repair - overall doing well, chronic DOE at very high levels of exertion only that has been overall stable - continue to monitor    2.OSA - did not tolerate cpap - dicussed briefly inspire and if he has interests let us know and we could refer to pulmonary for an evalution.      Antoine Poche, M.D.

## 2022-08-11 NOTE — Patient Instructions (Signed)

## 2022-09-20 DIAGNOSIS — N1832 Chronic kidney disease, stage 3b: Secondary | ICD-10-CM | POA: Diagnosis not present

## 2022-09-20 DIAGNOSIS — N183 Chronic kidney disease, stage 3 unspecified: Secondary | ICD-10-CM | POA: Diagnosis not present

## 2022-09-20 DIAGNOSIS — G4733 Obstructive sleep apnea (adult) (pediatric): Secondary | ICD-10-CM | POA: Diagnosis not present

## 2022-09-20 DIAGNOSIS — R972 Elevated prostate specific antigen [PSA]: Secondary | ICD-10-CM | POA: Diagnosis not present

## 2022-09-20 DIAGNOSIS — D649 Anemia, unspecified: Secondary | ICD-10-CM | POA: Diagnosis not present

## 2022-09-20 DIAGNOSIS — I340 Nonrheumatic mitral (valve) insufficiency: Secondary | ICD-10-CM | POA: Diagnosis not present

## 2022-09-20 DIAGNOSIS — Z0000 Encounter for general adult medical examination without abnormal findings: Secondary | ICD-10-CM | POA: Diagnosis not present

## 2022-09-20 DIAGNOSIS — E785 Hyperlipidemia, unspecified: Secondary | ICD-10-CM | POA: Diagnosis not present

## 2022-11-03 DIAGNOSIS — D649 Anemia, unspecified: Secondary | ICD-10-CM | POA: Diagnosis not present

## 2022-11-04 DIAGNOSIS — R972 Elevated prostate specific antigen [PSA]: Secondary | ICD-10-CM | POA: Diagnosis not present

## 2023-09-28 DIAGNOSIS — Z0000 Encounter for general adult medical examination without abnormal findings: Secondary | ICD-10-CM | POA: Diagnosis not present

## 2023-09-28 DIAGNOSIS — R972 Elevated prostate specific antigen [PSA]: Secondary | ICD-10-CM | POA: Diagnosis not present

## 2023-09-28 DIAGNOSIS — E785 Hyperlipidemia, unspecified: Secondary | ICD-10-CM | POA: Diagnosis not present

## 2023-09-28 DIAGNOSIS — G4733 Obstructive sleep apnea (adult) (pediatric): Secondary | ICD-10-CM | POA: Diagnosis not present

## 2023-09-28 DIAGNOSIS — N183 Chronic kidney disease, stage 3 unspecified: Secondary | ICD-10-CM | POA: Diagnosis not present

## 2023-09-28 DIAGNOSIS — I340 Nonrheumatic mitral (valve) insufficiency: Secondary | ICD-10-CM | POA: Diagnosis not present
